# Patient Record
Sex: Male | Born: 1955 | Race: White | Hispanic: No | Smoking: Never smoker
Health system: Southern US, Community
[De-identification: ages and names within clinical notes are randomized; demographics above are authoritative.]

## PROBLEM LIST (undated history)

## (undated) DIAGNOSIS — Z21 Asymptomatic human immunodeficiency virus [HIV] infection status: Secondary | ICD-10-CM

## (undated) DIAGNOSIS — I209 Angina pectoris, unspecified: Secondary | ICD-10-CM

## (undated) DIAGNOSIS — N200 Calculus of kidney: Secondary | ICD-10-CM

## (undated) DIAGNOSIS — I2510 Atherosclerotic heart disease of native coronary artery without angina pectoris: Secondary | ICD-10-CM

## (undated) DIAGNOSIS — I219 Acute myocardial infarction, unspecified: Secondary | ICD-10-CM

## (undated) DIAGNOSIS — B20 Human immunodeficiency virus [HIV] disease: Secondary | ICD-10-CM

## (undated) DIAGNOSIS — E119 Type 2 diabetes mellitus without complications: Secondary | ICD-10-CM

## (undated) DIAGNOSIS — C469 Kaposi's sarcoma, unspecified: Secondary | ICD-10-CM

## (undated) DIAGNOSIS — Z8719 Personal history of other diseases of the digestive system: Secondary | ICD-10-CM

## (undated) DIAGNOSIS — I639 Cerebral infarction, unspecified: Secondary | ICD-10-CM

## (undated) DIAGNOSIS — I5022 Chronic systolic (congestive) heart failure: Secondary | ICD-10-CM

## (undated) DIAGNOSIS — I10 Essential (primary) hypertension: Secondary | ICD-10-CM

## (undated) DIAGNOSIS — R55 Syncope and collapse: Secondary | ICD-10-CM

## (undated) DIAGNOSIS — E039 Hypothyroidism, unspecified: Secondary | ICD-10-CM

## (undated) DIAGNOSIS — F419 Anxiety disorder, unspecified: Secondary | ICD-10-CM

## (undated) DIAGNOSIS — I214 Non-ST elevation (NSTEMI) myocardial infarction: Secondary | ICD-10-CM

## (undated) DIAGNOSIS — K219 Gastro-esophageal reflux disease without esophagitis: Secondary | ICD-10-CM

## (undated) DIAGNOSIS — J189 Pneumonia, unspecified organism: Secondary | ICD-10-CM

## (undated) DIAGNOSIS — J690 Pneumonitis due to inhalation of food and vomit: Secondary | ICD-10-CM

## (undated) DIAGNOSIS — E1140 Type 2 diabetes mellitus with diabetic neuropathy, unspecified: Secondary | ICD-10-CM

## (undated) DIAGNOSIS — E785 Hyperlipidemia, unspecified: Secondary | ICD-10-CM

## (undated) HISTORY — DX: Cerebral infarction, unspecified: I63.9

## (undated) HISTORY — DX: Essential (primary) hypertension: I10

## (undated) HISTORY — PX: CORONARY ARTERY BYPASS GRAFT: SHX141

## (undated) HISTORY — DX: Atherosclerotic heart disease of native coronary artery without angina pectoris: I25.10

## (undated) HISTORY — DX: Asymptomatic human immunodeficiency virus (hiv) infection status: Z21

## (undated) HISTORY — DX: Human immunodeficiency virus (HIV) disease: B20

## (undated) HISTORY — DX: Syncope and collapse: R55

## (undated) HISTORY — DX: Hyperlipidemia, unspecified: E78.5

## (undated) HISTORY — DX: Type 2 diabetes mellitus with diabetic neuropathy, unspecified: E11.40

## (undated) HISTORY — PX: CYSTOSCOPY W/ STONE MANIPULATION: SHX1427

## (undated) HISTORY — PX: LOOP RECORDER IMPLANT: SHX5954

## (undated) HISTORY — PX: CORONARY ANGIOPLASTY WITH STENT PLACEMENT: SHX49

---

## 1996-05-27 DIAGNOSIS — C469 Kaposi's sarcoma, unspecified: Secondary | ICD-10-CM

## 1996-05-27 HISTORY — DX: Kaposi's sarcoma, unspecified: C46.9

## 2001-06-09 ENCOUNTER — Inpatient Hospital Stay (HOSPITAL_COMMUNITY): Admission: EM | Admit: 2001-06-09 | Discharge: 2001-06-27 | Payer: Self-pay | Admitting: Emergency Medicine

## 2001-06-09 ENCOUNTER — Encounter: Payer: Self-pay | Admitting: Emergency Medicine

## 2001-06-23 ENCOUNTER — Encounter: Payer: Self-pay | Admitting: Cardiothoracic Surgery

## 2001-06-23 ENCOUNTER — Encounter: Payer: Self-pay | Admitting: Thoracic Surgery (Cardiothoracic Vascular Surgery)

## 2001-06-24 ENCOUNTER — Encounter: Payer: Self-pay | Admitting: Thoracic Surgery (Cardiothoracic Vascular Surgery)

## 2001-06-25 ENCOUNTER — Encounter: Payer: Self-pay | Admitting: Thoracic Surgery (Cardiothoracic Vascular Surgery)

## 2003-09-07 ENCOUNTER — Ambulatory Visit (HOSPITAL_COMMUNITY): Admission: RE | Admit: 2003-09-07 | Discharge: 2003-09-08 | Payer: Self-pay | Admitting: Cardiology

## 2003-09-25 ENCOUNTER — Emergency Department (HOSPITAL_COMMUNITY): Admission: EM | Admit: 2003-09-25 | Discharge: 2003-09-26 | Payer: Self-pay | Admitting: Emergency Medicine

## 2004-04-16 ENCOUNTER — Ambulatory Visit: Payer: Self-pay | Admitting: Cardiology

## 2004-08-03 ENCOUNTER — Ambulatory Visit: Payer: Self-pay | Admitting: Cardiology

## 2004-09-03 ENCOUNTER — Ambulatory Visit: Payer: Self-pay | Admitting: Cardiology

## 2004-09-11 ENCOUNTER — Ambulatory Visit (HOSPITAL_COMMUNITY): Admission: RE | Admit: 2004-09-11 | Discharge: 2004-09-11 | Payer: Self-pay | Admitting: Cardiology

## 2004-09-11 ENCOUNTER — Ambulatory Visit: Payer: Self-pay | Admitting: Cardiology

## 2004-10-20 ENCOUNTER — Emergency Department (HOSPITAL_COMMUNITY): Admission: EM | Admit: 2004-10-20 | Discharge: 2004-10-20 | Payer: Self-pay | Admitting: Emergency Medicine

## 2005-07-22 ENCOUNTER — Ambulatory Visit: Payer: Self-pay | Admitting: Cardiology

## 2005-09-17 ENCOUNTER — Ambulatory Visit: Payer: Self-pay | Admitting: Cardiology

## 2005-09-20 ENCOUNTER — Ambulatory Visit: Payer: Self-pay | Admitting: Cardiology

## 2005-09-20 ENCOUNTER — Ambulatory Visit (HOSPITAL_COMMUNITY): Admission: RE | Admit: 2005-09-20 | Discharge: 2005-09-21 | Payer: Self-pay | Admitting: Cardiology

## 2005-09-26 ENCOUNTER — Ambulatory Visit: Payer: Self-pay | Admitting: Cardiology

## 2005-10-01 ENCOUNTER — Ambulatory Visit: Payer: Self-pay | Admitting: Cardiology

## 2006-02-12 ENCOUNTER — Ambulatory Visit: Payer: Self-pay | Admitting: Cardiology

## 2006-09-22 ENCOUNTER — Ambulatory Visit: Payer: Self-pay | Admitting: Cardiology

## 2007-01-12 ENCOUNTER — Ambulatory Visit: Payer: Self-pay | Admitting: Cardiology

## 2007-01-25 ENCOUNTER — Emergency Department (HOSPITAL_COMMUNITY): Admission: EM | Admit: 2007-01-25 | Discharge: 2007-01-25 | Payer: Self-pay | Admitting: Emergency Medicine

## 2007-05-13 ENCOUNTER — Ambulatory Visit: Payer: Self-pay | Admitting: Cardiology

## 2007-09-16 ENCOUNTER — Ambulatory Visit: Payer: Self-pay | Admitting: Cardiology

## 2008-02-29 ENCOUNTER — Ambulatory Visit: Payer: Self-pay | Admitting: Cardiology

## 2008-02-29 LAB — CONVERTED CEMR LAB
CO2: 29 meq/L (ref 19–32)
Calcium: 9.5 mg/dL (ref 8.4–10.5)
Chloride: 99 meq/L (ref 96–112)
Creatinine, Ser: 1.4 mg/dL (ref 0.4–1.5)
Free T4: 1.3 ng/dL (ref 0.6–1.6)
Glucose, Bld: 175 mg/dL — ABNORMAL HIGH (ref 70–99)
INR: 0.9 (ref 0.8–1.0)
Lymphocytes Relative: 37.7 % (ref 12.0–46.0)
MCHC: 34.2 g/dL (ref 30.0–36.0)
Monocytes Relative: 9.9 % (ref 3.0–12.0)
Platelets: 329 10*3/uL (ref 150–400)
Potassium: 4.2 meq/L (ref 3.5–5.1)
TSH: 3.28 microintl units/mL (ref 0.35–5.50)
WBC: 5.4 10*3/uL (ref 4.5–10.5)

## 2008-03-07 ENCOUNTER — Ambulatory Visit: Payer: Self-pay | Admitting: Cardiology

## 2008-03-07 ENCOUNTER — Ambulatory Visit (HOSPITAL_COMMUNITY): Admission: RE | Admit: 2008-03-07 | Discharge: 2008-03-07 | Payer: Self-pay | Admitting: Cardiology

## 2008-10-12 DIAGNOSIS — E1165 Type 2 diabetes mellitus with hyperglycemia: Secondary | ICD-10-CM

## 2008-10-12 DIAGNOSIS — E7800 Pure hypercholesterolemia, unspecified: Secondary | ICD-10-CM | POA: Insufficient documentation

## 2008-10-12 DIAGNOSIS — K219 Gastro-esophageal reflux disease without esophagitis: Secondary | ICD-10-CM

## 2008-10-12 DIAGNOSIS — C469 Kaposi's sarcoma, unspecified: Secondary | ICD-10-CM | POA: Insufficient documentation

## 2008-10-12 DIAGNOSIS — N182 Chronic kidney disease, stage 2 (mild): Secondary | ICD-10-CM | POA: Insufficient documentation

## 2008-10-12 DIAGNOSIS — B20 Human immunodeficiency virus [HIV] disease: Secondary | ICD-10-CM

## 2008-10-12 DIAGNOSIS — E039 Hypothyroidism, unspecified: Secondary | ICD-10-CM | POA: Insufficient documentation

## 2008-10-12 DIAGNOSIS — Z794 Long term (current) use of insulin: Secondary | ICD-10-CM

## 2008-10-12 DIAGNOSIS — Z951 Presence of aortocoronary bypass graft: Secondary | ICD-10-CM

## 2009-02-28 ENCOUNTER — Ambulatory Visit: Payer: Self-pay | Admitting: Cardiology

## 2009-02-28 DIAGNOSIS — N529 Male erectile dysfunction, unspecified: Secondary | ICD-10-CM | POA: Insufficient documentation

## 2009-12-29 ENCOUNTER — Ambulatory Visit: Payer: Self-pay | Admitting: Cardiology

## 2010-02-06 ENCOUNTER — Ambulatory Visit: Payer: Self-pay | Admitting: Cardiology

## 2010-02-14 ENCOUNTER — Ambulatory Visit: Payer: Self-pay | Admitting: Cardiology

## 2010-02-14 ENCOUNTER — Ambulatory Visit (HOSPITAL_COMMUNITY): Admission: RE | Admit: 2010-02-14 | Discharge: 2010-02-14 | Payer: Self-pay | Admitting: Cardiology

## 2010-02-19 ENCOUNTER — Telehealth: Payer: Self-pay | Admitting: Cardiology

## 2010-02-22 ENCOUNTER — Ambulatory Visit: Payer: Self-pay | Admitting: Cardiology

## 2010-03-05 ENCOUNTER — Ambulatory Visit: Payer: Self-pay | Admitting: Cardiology

## 2010-03-14 LAB — CONVERTED CEMR LAB
AST: 18 units/L (ref 0–37)
Alkaline Phosphatase: 88 units/L (ref 39–117)
Direct LDL: 176 mg/dL
Total CHOL/HDL Ratio: 6
VLDL: 18.6 mg/dL (ref 0.0–40.0)

## 2010-03-15 ENCOUNTER — Telehealth: Payer: Self-pay | Admitting: Cardiology

## 2010-06-17 ENCOUNTER — Encounter: Payer: Self-pay | Admitting: Internal Medicine

## 2010-06-24 LAB — CONVERTED CEMR LAB
BUN: 21 mg/dL (ref 6–23)
Calcium: 9.2 mg/dL (ref 8.4–10.5)
GFR calc non Af Amer: 66.66 mL/min (ref >60)
Potassium: 4.5 meq/L (ref 3.5–5.1)

## 2010-06-26 NOTE — Assessment & Plan Note (Signed)
Summary: Jackson Shaw   Visit Type:  Follow-up Primary [name]:  Dr. Alvina Chou  CC:  tightness, chest pain, and sob.  History of Present Illness: More chest pain with activity.  Still gets tight.  He eats in a healthy fashion.  The symptoms continue to get worse.  Has some discomfort at rest.  He notices it more when he is driving the car.   Current Medications (verified): 1)  Toprol Xl 50 Mg Xr24h-Tab (Metoprolol Succinate) .... Take 1 Tablet By Mouth Once A Day 2)  Epavir 300/450mg  .... Once Daily 3)  Viread Combo Drug .... Once Daily 4)  Reyataz 500mg  .... Once Daily 5)  Imdur 30 Mg Xr24h-Tab (Isosorbide Mononitrate) .... Take 1 1/2  Tablet By Mouth Once A Day 6)  Pravastatin Sodium 20 Mg Tabs (Pravastatin Sodium) .Marland Kitchen.. 1 Tab At Bedtime 7)  Plavix 75 Mg Tabs (Clopidogrel Bisulfate) .... Take 1 Tablet By Mouth Once A Day 8)  Humalog 100 Unit/ml Soln (Insulin Lispro (Human)) .... As Directed 9)  Protonix 40 Mg Tbec (Pantoprazole Sodium) .... Take 1 Tablet By Mouth Once A Day 10)  Aspirin 81 Mg Tbec (Aspirin) .... Take One Tablet By Mouth Daily 11)  Synthroid 200 Mcg Tabs (Levothyroxine Sodium) .... Take 1 Tablet By Mouth Once A Day 12)  Nitrostat 0.4 Mg Subl (Nitroglycerin) .... Take As Directed For Chest Pain  Allergies (verified): 1)  ! Penicillin 2)  ! 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 Head:  normocephalic and atraumatic Eyes:  PERRLA/EOM intact; conjunctiva and lids normal. Chest Wall:  MS healed. Lungs:  Clear bilaterally to auscultation and percussion. Heart:  PMI non displaced.  Normal S1 and S2.  No murmur or rub. Pulses:  pulses normal in all 4 extremities Extremities:  No clubbing or cyanosis. Neurologic:  Alert and oriented x 3.   Cardiac Cath  Procedure date:  03/07/2008  Findings:       CONCLUSION:   1. Continued patency of the internal mammary to the LAD with some       progression of disease just distal to its insertion and known       apical disease.   2. Continued patency of the sequential vein graft to the diagonal OM       with some retrograde filling of the posterior circumflex.   3. Continued patency of the saphenous vein graft to the PDA, but with       diffuse advanced distal disease that is not amenable to       percutaneous intervention.      DISPOSITION:  The patient does have some renal insufficiency.  We will   have him hydrated at home.  I will see him back in the office.  We will   increase  his Imdur to 60 mg daily.  Hopefully, his symptoms will   improve; if not, he would be a candidate for a small drug-eluting stent   into the LAD if he can take Plavix on an extended basis.   EKG  Procedure date:  12/29/2009  Findings:      NSR.  LAFB.  Inferior MI, old.  No acute changes.  Impression & Recommendations:  Problem # 1:  CAD, ARTERY BYPASS GRAFT (ICD-414.04) Continues to have symptoms.  Anatomy from cath reviewed in detail, and discussed.  Will increase beta blockade as HR in the 80's, and assess response.  Would prefer to defer cath, and results of last study discussed.  If symptoms continue to progress, may need restudy.   His updated medication list for this problem includes:    Toprol Xl 50 Mg Xr24h-tab  (Metoprolol succinate) .Marland Kitchen... Take 11/2 tabs twice daily    Imdur 30 Mg Xr24h-tab (Isosorbide mononitrate) .Marland Kitchen... Take 1 1/2  tablet by mouth once a day    Plavix 75 Mg Tabs (Clopidogrel bisulfate) .Marland Kitchen... Take 1 tablet by mouth once a day    Aspirin 81 Mg Tbec (Aspirin) .Marland Kitchen... Take one tablet by mouth daily    Nitrostat 0.4 Mg Subl (Nitroglycerin) .Marland Kitchen... Take as directed for chest pain  Problem # 2:  RENAL DISEASE, CHRONIC, STAGE II (ICD-585.2) Followed at [facility].  Has needed hydration with each cath study.  Problem # 3:  HYPERCHOLESTEROLEMIA (ICD-272.0) was restarted at [facility] on medical therapy.  Some limitation by other drugs.  His updated medication list for this problem includes:    Pravastatin Sodium 20 Mg Tabs (Pravastatin sodium) .Marland Kitchen... 1 tab at bedtime  Problem # 4:  HYPOTHYROIDISM (ICD-244.9) followed in endo clinic at [facility].  Was an issue in the past.   His updated medication list for this problem includes:    Synthroid 200 Mcg Tabs (Levothyroxine sodium) .Marland Kitchen... Take 1 tablet by mouth once a day  Patient Instructions: 1)  Your physician recommends that you schedule a follow-up appointment in: 6 WEEKS 2)  Your physician has recommended you make the following change in your medication: PLEASE INCREASE METOPROLOL TO 75MG  bid Prescriptions: TOPROL XL 50 MG XR24H-TAB (METOPROLOL SUCCINATE) take 11/2 tabs twice daily  #90 x 11   Entered by:   Ledon Snare, RN   Authorized by:   Ronaldo Miyamoto, MD, [facility]   Signed by:   Ledon Snare, RN on 12/29/2009   Method used:   [facility] to        [facility]  [phone]* (retail)       [address] [place], [place]  [address]       Ph: [phone] or [phone]       Fax: [phone]   RxID:   [id number]

## 2010-06-26 NOTE — Assessment & Plan Note (Signed)
Summary: ROV   Visit Type:  Follow-up Primary Provider:  Dr. Alvina Chou  CC:  Chest pains.  History of Present Illness: Went to the [employer], and started having chest pain walking back up the seats.  He sat in the sun, but did not get dehydrated.  Frequency of pain has continued, and increased, and he feels he needs another cath procedure.  We have a close, ongoing dialogue about this over an extended period of time.    Current Medications (verified): 1)  Toprol Xl 50 Mg Xr24h-Tab (Metoprolol Succinate) .... Take 11/2 Tabs Twice Daily 2)  Epavir 300/450mg  .... Once Daily 3)  Viread Combo Drug .... Once Daily 4)  Reyataz 500mg  .... Once Daily 5)  Imdur 30 Mg Xr24h-Tab (Isosorbide Mononitrate) .... Take 1 1/2  Tablet By Mouth Once A Day 6)  Pravastatin Sodium 20 Mg Tabs (Pravastatin Sodium) .Marland Kitchen.. 1 Tab At Bedtime 7)  Plavix 75 Mg Tabs (Clopidogrel Bisulfate) .... Take 1 Tablet By Mouth Once A Day 8)  Humalog 100 Unit/ml Soln (Insulin Lispro (Human)) .... As Directed 9)  Protonix 40 Mg Tbec (Pantoprazole Sodium) .... Take 1 Tablet By Mouth Once A Day 10)  Aspirin 81 Mg Tbec (Aspirin) .... Take One Tablet By Mouth Daily 11)  Synthroid 200 Mcg Tabs (Levothyroxine Sodium) .... Take 1 Tablet By Mouth Once A Day 12)  Nitrostat 0.4 Mg Subl (Nitroglycerin) .... Take As Directed For Chest Pain  Allergies: 1)  ! Penicillin 2)  ! 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  Family History: Last updated: 10/12/2008  The  patient has an extensive family history of coronary  heart disease.  His mother is living.  She has had a prior myocardial  infarction and bypass surgery.  His father died at age 7 of myocardial  infarction.  He has a sister who is 15 and has diabetes.  Social History: Last updated: 10/12/2008  The patient denies alcohol or tobacco use.  He does not use  any illicit drugs.  Vital Signs:  Patient profile:   55 year old male Height:      69 inches Weight:      160.50 pounds BMI:     23.79 Pulse rate:   74 / minute Pulse rhythm:   regular Resp:     18 per minute BP sitting:   126 / 84  (left arm) Cuff size:   large  Vitals Entered By: Vikki Ports (February 06, 2010 9:27 AM)  Physical Exam  General:  Well developed, well nourished, in no acute distress. Head:  normocephalic and atraumatic Eyes:  PERRLA/EOM intact; conjunctiva and lids normal. Lungs:  Clear bilaterally to auscultation and percussion. Heart:  PMI non displaced.  Normal S1 and S2.   Abdomen:  Bowel sounds positive; abdomen soft and non-tender without masses, organomegaly, or hernias noted. No hepatosplenomegaly. Extremities:  No clubbing or cyanosis. Neurologic:  Alert and oriented x 3.   Cardiac Cath  Procedure date:  03/07/2008  Findings:       ANGIOGRAPHIC DATA:   1. The left main is complex at its distal-most aspect with about 70%       eccentric  narrowing.   2. The LAD itself is occluded.   3. The left internal mammary to the distal LAD is widely patent.       However, just after its insertion into the LAD, there is a mildly       segmental plaque of about 78% which is progressed from the previous       study.  The vessel then opens up and courses to the apex where       there is a 90% stenosis just prior to the apical tip.  It       bifurcates at the apical tip.  This provides coverage of the distal       portion of the inferior wall.   4. The circumflex proper has 2 marginal branches, which are  out.       There is an AV branch with about 90% narrowing.  The sequential       vein graft to the diagonal and marginal is widely patent.  It does       fill retrograde into the AV circumflex.   5. The right coronary artery is severely diseased with multiple       lesions, then totally occluded in its midportion.   6. The saphenous vein graft to the PDA is intact.  However, the PDA in       an antegrade fashion is a typical diabetic-appearing artery,       diffusely diseased.  The retrograde portion has disease also before       it fills the AV groove.  There is a first branch of the AV groove       and a 70% segmental plaque.  There is another tiny branch, which       has 90% narrowing and there is 60% narrowing just prior to the       origin of fairly large posterolateral branch.  The posterolateral       branch has segmental plaque of about 90%, and the distal vessel was       fairly small in caliber, certainly not a good vessel for grafting.       There is also 70% narrowing in the continuation branch.      CONCLUSION:   1. Continued patency of the internal mammary to the LAD with some       progression of disease just distal to its insertion and known       apical disease.   2. Continued patency of the sequential vein graft to the diagonal OM       with some retrograde fi  Cardiac Cath  Procedure date:  02/06/2010  Findings:      NSR.  WNL.  Impression & Recommendations:  Problem # 1:  CAD, ARTERY BYPASS GRAFT (ICD-414.04) Continues to have worsening chest pain.  Will bring in for hydration, and repeat cath.  Options are somewhat limited, and he understands, but wants to be clear about options over time.  I agree with him, and will recommend cath study.  His updated medication list for this problem includes:    Toprol Xl 50 Mg Xr24h-tab (Metoprolol succinate) .Marland Kitchen... Take 11/2 tabs twice daily    Imdur 30 Mg Xr24h-tab (Isosorbide mononitrate) .Marland Kitchen... Take 1 1/2  tablet by mouth  once a day    Plavix 75 Mg Tabs (Clopidogrel bisulfate) .Marland Kitchen... Take 1 tablet by mouth once a day    Aspirin 81 Mg Tbec (Aspirin) .Marland Kitchen... Take one tablet  by mouth daily    Nitrostat 0.4 Mg Subl (Nitroglycerin) .Marland Kitchen... Take as directed for chest pain  Problem # 2:  HYPERCHOLESTEROLEMIA (ICD-272.0)  undertreatment. His updated medication list for this problem includes:    Pravastatin Sodium 20 Mg Tabs (Pravastatin sodium) .Marland Kitchen... 1 tab at bedtime  His updated medication list for this problem includes:    Pravastatin Sodium 20 Mg Tabs (Pravastatin sodium) .Marland Kitchen... 1 tab at bedtime  Problem # 3:  HUMAN IMMUNODEFICIENCY VIRUS [HIV] (ICD-042) stable, followed in [place].  Problem # 4:  RENAL DISEASE, CHRONIC, STAGE II (ICD-585.2) secondary to diabetes.  Check cr. and decide on fluids.    Orders: EKG w/ Interpretation (93000) Cardiac Catheterization (Cardiac Cath) TLB-BMP (Basic Metabolic Panel-BMET) (80048-METABOL)  Patient Instructions: 1)  Your physician has requested that you have a cardiac catheterization.  Cardiac catheterization is used to diagnose and/or treat various heart conditions. Doctors may recommend this procedure for a number of different reasons. The most common reason is to evaluate chest pain. Chest pain can be a symptom of coronary artery disease (CAD), and cardiac catheterization can show whether plaque is narrowing or blocking your heart's arteries. This procedure is also used to evaluate the valves, as well as measure the blood flow and oxygen levels in different parts of your heart.  For further information please visit [email or web address]  Please follow instruction sheet, as given. 2)  Your physician recommends that you have lab work today: BMP 3)  Your physician recommends that you continue on your current medications as directed. Please refer to the Current Medication list given to you today. 4)  Your physician recommends that you schedule a follow-up appointment in: 3  WEEKS Prescriptions: NITROSTAT 0.4 MG SUBL (NITROGLYCERIN) Take as directed for chest pain  #25 x 2   Entered by:   Julieta Gutting, RN, BSN   Authorized by:   Ronaldo Miyamoto, MD, [facility]   Signed by:   Julieta Gutting, RN, BSN on 02/06/2010   Method used:   [facility] to        [facility]  [phone]* (retail)       [address] [place], [place]  [address]       Ph: [phone] or [phone]       Fax: [phone]   RxID:   [id number]

## 2010-06-26 NOTE — Letter (Signed)
Summary: Cardiac Catheterization Instructions- Main Lab  [facility], Main Office  [address]   [place], [place] [address]   Phone: [phone]  Fax: [phone]     02/06/2010 MRN: [id number]  [facility] [address] [place], [place]  [address]  Dear Mr. Newgent,   You are scheduled for Cardiac Catheterization on Wednesday February 14, 2010 with Dr. Riley Kill.  Please arrive at the [facility] at 7:30       a.m. on the day of your procedure.  1. DIET     _X___ Nothing to eat or drink after midnight except your medications with a sip of water.  2. MAKE SURE YOU TAKE YOUR ASPIRIN AND PLAVIX.  3. __X___ DO NOT TAKE these medications before your procedure:         Do not take Humalog the morning of procedure.  Take half of evening dose of Humalog on Tuesday night.       _X___ YOU MAY TAKE ALL of your remaining medications with a small amount of water.       4. Plan for one night stay - bring personal belongings (i.e. toothpaste, toothbrush, etc.)  5. Bring a current list of your medications and current insurance cards.  6. Must have a responsible person to drive you home.   7. Someone must be with you for the first 24 hours after you arrive home.  8. Please wear clothes that are easy to get on and off and wear slip-on shoes.  *Special note: Every effort is made to have your procedure done on time.  Occasionally there are emergencies that present themselves at the hospital that may cause delays.  Please be patient if a delay does occur.  If you have any questions after you get home, please call the office at the number listed above.  Julieta Gutting, RN, BSN

## 2010-06-26 NOTE — Progress Notes (Signed)
Summary: Increase Pravastatin to 40mg   Phone Note Outgoing Call   Call placed by: Julieta Gutting, RN, BSN,  March 15, 2010 3:15 PM Call placed to: Patient Summary of Call: I spoke with the pt and made him aware of lab results.  The pt will increase Pravastatin to 40mg  daily.  The pt has a scheduled appt on 04/25/10 and he will come into the office fasting. Pt agreed with plan.  New Rx sent to pharmacy.  Initial call taken by: Julieta Gutting, RN, BSN,  March 15, 2010 3:15 PM    New/Updated Medications: PRAVASTATIN SODIUM 40 MG TABS (PRAVASTATIN SODIUM) Take one tablet by mouth daily at bedtime Prescriptions: PRAVASTATIN SODIUM 40 MG TABS (PRAVASTATIN SODIUM) Take one tablet by mouth daily at bedtime  #30 x 6   Entered by:   Julieta Gutting, RN, BSN   Authorized by:   Ronaldo Miyamoto, MD, [facility]   Signed by:   Julieta Gutting, RN, BSN on 03/15/2010   Method used:   [facility] to        [facility]  [phone]* (retail)       [address] [place], [place]  [address]       Ph: [phone] or [phone]       Fax: [phone]   RxID:   [id number]

## 2010-06-26 NOTE — Cardiovascular Report (Signed)
Summary: Cath/Percutaneous Orders   Cath/Percutaneous Orders   Imported By: Roderic Ovens 02/23/2010 09:18:17  _____________________________________________________________________  External Attachment:    Type:   Image     Comment:   External Document

## 2010-06-26 NOTE — Assessment & Plan Note (Signed)
Summary: Post hospital   Visit Type:  Post-hospital Primary Provider:  Dr. Alvina Chou  CC:  Some chest pains.  History of Present Illness: Recently seen in the hospital again for repeat cath.  We went back over all of his anatomy today in detail, and I reviewed with him his angio on the laptop computer.  We discussed mechanisms of continued angina.  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  We talked medical strategies, with subsequent potential use of Ranexa.  I have some concerns in part over the complexity of his antiviral regimen for HIV/AIDS, and additional treatment, but we are thoughtfully considering it, and I may bring it up with his ID physician in [facility] if he is not improved in the short term.    Current Medications (verified): 1)  Toprol Xl 50 Mg Xr24h-Tab (Metoprolol Succinate) .... Take 11/2 Tabs Twice Daily 2)  Epavir 300/450mg  .... Once Daily 3)  Viread Combo Drug .... Once Daily 4)  Reyataz 500mg  .... Once Daily 5)  Imdur 30 Mg Xr24h-Tab (Isosorbide Mononitrate) .... Take 1 1/2  Tablet By Mouth Once A Day 6)  Pravastatin Sodium 20 Mg Tabs (Pravastatin Sodium) .Marland Kitchen.. 1 Tab At Bedtime 7)  Plavix 75 Mg Tabs (Clopidogrel Bisulfate) .... Take 1 Tablet By Mouth Once A Day 8)  Humalog 100 Unit/ml Soln (Insulin Lispro (Human)) .... As Directed 9)  Protonix 40 Mg Tbec (Pantoprazole Sodium) .... Take 1 Tablet By Mouth Once A Day 10)  Aspirin 81 Mg Tbec (Aspirin) .... Take One Tablet By Mouth Daily 11)  Synthroid 200 Mcg Tabs (Levothyroxine Sodium) .... Take 1 Tablet By Mouth Once A Day 12)  Nitrostat 0.4 Mg Subl (Nitroglycerin) .... Take As Directed For Chest Pain  Allergies: 1)  ! Penicillin 2)  ! Codeine  Vital Signs:  Patient profile:   55 year old male Height:      69 inches Weight:      160.75 pounds BMI:     23.82 Pulse rate:   68 / minute Pulse rhythm:   regular Resp:     18 per minute BP sitting:   122 / 80  (left arm) Cuff size:   large  Vitals  Entered By: Vikki Ports (February 22, 2010 2:03 PM)  Physical Exam  General:  Well developed, well nourished, in no acute distress. Head:  normocephalic and atraumatic Eyes:  PERRLA/EOM intact; conjunctiva and lids normal. Lungs:  Clear bilaterally to auscultation and percussion. Heart:  PMI non displaced.  Normal S1 and S2.  No murmur or rub, or gallop.   Pulses:  pulses normal in all 4 extremities Extremities:  No clubbing or cyanosis. Neurologic:  Alert and oriented x 3.   EKG  Procedure date:  02/22/2010  Findings:      NSR.  WNL.  CXR  Procedure date:  02/14/2010  Findings:      CHEST - 2 VIEW   Comparison:  03/07/2008   Findings:  The heart size and mediastinal contours are within normal limits.  Both lungs are clear.  The visualized skeletal structures are unremarkable. The previous coronary bypass grafting noted.   IMPRESSION: No active cardiopulmonary disease.   Read By:  Danae Orleans,  M.D.     Released By:  Danae Orleans,  M.D.  Cardiac Cath  Procedure date:  02/14/2010  Findings:      CONCLUSIONS: 1. Continued patency of the internal mammary to the LAD with severe     apical  disease. 2. Continued patency of the sequential graft to the diagonal and ramus     intermedius with some flow into the distal diagonal. 3. Continued patency of the saphenous vein graft to the PDA with     distal PDA and retrograde PLA disease. 4. Progression of the left main lesion, but this area appears to be     supplying only a small diagonal and there is protection of this     area from the intermediate graft.   DISPOSITION:  We will continue to monitor.  Recommend medical therapy and I will see him back in followup.    Impression & Recommendations:  Problem # 1:  CAD, ARTERY BYPASS GRAFT (ICD-414.04) See my note. His graft situation is preetty good, largely unchanged.  He has severe native disease underlying the findings,  with his distal RCA, retrograde RCA,  apical LAD and other findings as noted.  We have discussed medical strategies as in the HPI, and will continue along this route.  I reassured him.  Redo would not help, and the situation is very unfavorable for PCI.  We thoroughly reviewed.   His updated medication list for this problem includes:    Toprol Xl 50 Mg Xr24h-tab (Metoprolol succinate) .Marland Kitchen... Take 11/2 tabs twice daily    Imdur 30 Mg Xr24h-tab (Isosorbide mononitrate) .Marland Kitchen... Take 1 1/2  tablet by mouth once a day    Plavix 75 Mg Tabs (Clopidogrel bisulfate) .Marland Kitchen... Take 1 tablet by mouth once a day    Aspirin 81 Mg Tbec (Aspirin) .Marland Kitchen... Take one tablet by mouth daily    Nitrostat 0.4 Mg Subl (Nitroglycerin) .Marland Kitchen... Take as directed for chest pain    Isosorbide Mononitrate Cr 60 Mg Xr24h-tab (Isosorbide mononitrate) .Marland Kitchen... Take one tablet daily  Orders: EKG w/ Interpretation (93000)  Problem # 2:  HYPERCHOLESTEROLEMIA (ICD-272.0) Remains on treatment.  His dose could be increased.  We will recheck his lipids,and adjust.   His updated medication list for this problem includes:    Pravastatin Sodium 20 Mg Tabs (Pravastatin sodium) .Marland Kitchen... 1 tab at bedtime  Problem # 3:  DIABETES MELLITUS, TYPE II, ON INSULIN (ICD-250.00) medically managed His updated medication list for this problem includes:    Humalog 100 Unit/ml Soln (Insulin lispro (human)) .Marland Kitchen... As directed    Aspirin 81 Mg Tbec (Aspirin) .Marland Kitchen... Take one tablet by mouth daily  Problem # 4:  HYPOTHYROIDISM (ICD-244.9) thyroid replacement.  He assures me his TSH is appropriate.  This has been issue in the past. His updated medication list for this problem includes:    Synthroid 200 Mcg Tabs (Levothyroxine sodium) .Marland Kitchen... Take 1 tablet by mouth once a day  Problem # 5:  HUMAN IMMUNODEFICIENCY VIRUS [HIV] (ICD-042) Followed in [facility].  Patient Instructions: 1)  Your physician recommends that you schedule a follow-up appointment in: 2 MONTHS 2)  Your physician has recommended you  make the following change in your medication: START Isosorbide MN 60mg  one tablet daily Prescriptions: ISOSORBIDE MONONITRATE CR 60 MG XR24H-TAB (ISOSORBIDE MONONITRATE) take one tablet daily  #30 x 6   Entered by:   Julieta Gutting, RN, BSN   Authorized by:   Ronaldo Miyamoto, MD, [facility]   Signed by:   Julieta Gutting, RN, BSN on 02/22/2010   Method used:   [facility] to        [facility]  [phone]* (retail)       [address] [place], [place]  [address]       Ph: [phone] or [phone]       Fax: [phone]   RxID:   [id number]   Appended Document: Post hospital Lauren  Can you have him come in for lipid and liver, and TSH level.  Thanks so much.  TS  I spoke with the pt and he will come into the office on 03/05/10 for fasting labwork.

## 2010-06-26 NOTE — Progress Notes (Signed)
Summary: TALK TO NURSE  Phone Note Call from Patient Call back at Home Phone [phone]   Caller: Patient Summary of Call: PT WANTS TO TALK TO NURSE ABOUT HIS CATH THAT HE HAD. DR Riley Kill TOLD HIM TO CALL AND TALK WITH LAUREN Initial call taken by: Edman Circle,  February 19, 2010 10:42 AM  Follow-up for Phone Call        Dr Riley Kill wanted to make sure the pt has an appt with him this week to f/u on cardiac cath.  The pt already has a scheduled appt on 02/22/10 at 1:45 with Dr Riley Kill. Message left for pt on identified voicemail. Follow-up by: Julieta Gutting, RN, BSN,  February 19, 2010 11:25 AM

## 2010-08-01 ENCOUNTER — Telehealth: Payer: Self-pay | Admitting: Cardiology

## 2010-08-07 NOTE — Progress Notes (Signed)
Summary: handicap form  Phone Note Call from Patient Call back at Home Phone [phone]   Caller: Patient Reason for Call: Talk to Nurse Summary of Call: Pt. wants to know when can he come to pick up and a form for handicap for his car.  Initial call taken by: Roe Coombs,  August 01, 2010 9:39 AM  Follow-up for Phone Call        [facility]* Left message advising patient that Dr. Riley Kill would be in the office tomorrow. If he doesn't mind giving him the sticker, then his RN can call him when it is ready. Whitney Maeola Sarah RN  August 01, 2010 1:10 PM  Follow-up by: Whitney Maeola Sarah RN,  August 01, 2010 1:10 PM  Additional Follow-up for Phone Call Additional follow up Details #1::        pt wants to know if dr sign the form. pt would like to talk to a nurse. Additional Follow-up by: Roe Coombs,  August 02, 2010 5:00 PM    Additional Follow-up for Phone Call Additional follow up Details #2::    I spoke with Dr Riley Kill and he will sign for handicap placard.  I left a message on the pt's voicemail with this information.  I instructed the pt to call the office to let me know if he wants to pick-up paper or have paper mailed.  Julieta Gutting, RN, BSN  August 02, 2010 5:19 PM  The pt called back and would like to pick-up paper at the front desk. Julieta Gutting, RN, BSN  August 03, 2010 9:38 AM

## 2010-08-09 LAB — CBC
HCT: 36 % — ABNORMAL LOW (ref 39.0–52.0)
Hemoglobin: 12.7 g/dL — ABNORMAL LOW (ref 13.0–17.0)
RBC: 4.1 MIL/uL — ABNORMAL LOW (ref 4.22–5.81)
RDW: 12.9 % (ref 11.5–15.5)

## 2010-08-09 LAB — GLUCOSE, CAPILLARY
Glucose-Capillary: 117 mg/dL — ABNORMAL HIGH (ref 70–99)
Glucose-Capillary: 153 mg/dL — ABNORMAL HIGH (ref 70–99)
Glucose-Capillary: 163 mg/dL — ABNORMAL HIGH (ref 70–99)

## 2010-08-09 LAB — BASIC METABOLIC PANEL
CO2: 27 mEq/L (ref 19–32)
Calcium: 9.4 mg/dL (ref 8.4–10.5)
Creatinine, Ser: 1.53 mg/dL — ABNORMAL HIGH (ref 0.4–1.5)
GFR calc non Af Amer: 48 mL/min — ABNORMAL LOW (ref >60)
Glucose, Bld: 152 mg/dL — ABNORMAL HIGH (ref 70–99)
Potassium: 4.5 mEq/L (ref 3.5–5.1)

## 2010-08-09 LAB — TSH: TSH: 0.308 u[IU]/mL — ABNORMAL LOW (ref 0.350–4.500)

## 2010-10-09 NOTE — Assessment & Plan Note (Signed)
[facility]                            CARDIOLOGY OFFICE NOTE   Jackson Shaw, Jackson Shaw                         MRN:          [id number]  DATE:01/12/2007                            DOB:          1955/11/05    [name] is in for follow-up.  He was seen in the endocrinology clinic  at [facility].  Apparently his TSH was in the 40 range on 175 mcg.  He had  previously been on 200 mcg of Synthroid daily.  I reviewed the chart and  carefully pointed out to them that his dose was decreased from 200 mcg  to 175 mcg at a time when the TSH measured at 0.11.  In addition, the  patient was having increasing angina.  He apparently has recently been  noted to have two nodules on his CT scan.  They did a CT of the abdomen  because of discomfort under the right rib cage.  They previously had  noted him to have gallstones, but these were not felt to be symptomatic  and a CT of the abdomen done here did not reveal any specific findings.  He now is noted to have two small nodules on his CT scan of the chest  and, as a result they have recommended a follow-up, which is going to be  done at [facility].  Cardiac-wise he says he has been moderately fatigued but I  did point out to him that the hypothyroidism could be accountable for  this.  Chest pain has not been a frequent symptom.   His medications currently involve:  1. Synthroid 200 mcg daily.  2. Toprol XL 50 mg daily.  3. Plavix 75 mg daily.  4. Protonix 40 mg daily.  5. Humalog t.i.d.  6. Pravachol 20 mg q.h.s.  7. Imdur 30 mg daily.  8. His antiretroviral medications.  9. Enteric-coated aspirin 325 mg daily.   Today on examination, the blood pressure is 118/68, the pulse is 63.  The lung fields are clear.  The cardiac rhythm is regular.   The electrocardiogram is entirely within normal limits.   IMPRESSION:  1. Hypercholesterolemia.  2. Insulin-dependent diabetes mellitus.  3. Hypothyroidism.  4. Last catheterization  demonstrating patency of his vein grafts and      patency of the internal mammary artery with distal disease as noted      in the previous diagnostic text.   PLAN:  1. At the present time I would continue current medical regimen.  2. I have strongly encouraged him to have follow-up TSH's and done on      a regular basis.  I have worried about compliance in the past but      he insists that he is taking his thyroid on a regular basis and I      believe what he says.  He therefore should have follow-up TSH's to      make sure that he gets in the constraints of the appropriate TSH      levels.  3. I think he can decrease his enteric-coated aspirin to 81 mg  daily.  4. If he has increasing angina then we may be required to do a repeat      cardiac catheterization but, fortunately, we know that his grafts      are patent.  Most of the problems are probably from distal disease      and his EKG is normal, so continued medical therapy would be      indicated at the present time.  5. I will see him back in follow-up in 3 months.     Jackson Shaw. Jackson Kill, MD, [facility]  Electronically Signed    TDS/MedQ  DD: 01/12/2007  DT: 01/13/2007  Job #: [id number]

## 2010-10-09 NOTE — Cardiovascular Report (Signed)
Jackson Shaw, GROENE                ACCOUNT NO.:  [id number]   MEDICAL RECORD NO.:  [id number]          PATIENT TYPE:  OIB   LOCATION:  2899                         FACILITY:  [facility]   PHYSICIAN:  Arturo Morton. Riley Kill, MD, FACCDATE OF BIRTH:  [birth year]   DATE OF PROCEDURE:  03/07/2008  DATE OF DISCHARGE:  03/07/2008                            CARDIAC CATHETERIZATION   INDICATIONS:  Mr. Seltzer is a 55 year old, well known to me.  He has  insulin-dependent diabetes mellitus.  He also has some renal  insufficiency.  He has had prior bypass surgery and the current study is  done to assess coronary anatomy.  He has had recent increase in angina  frequency.   PROCEDURES:  1. Left heart catheterization.  2. Selective coronary arteriography.  3. Saphenous vein graft angiography.  4. Selective internal mammary angiography.   DESCRIPTION OF PROCEDURE:  The patient was brought to the  Catheterization Laboratory and prepped and draped in the usual fashion.  Through an anterior puncture, the femoral artery was easily entered.  A  5-French sheath was then placed.  Views of the left and right coronaries  were obtained.  Vein graft and internal mammary angiography were then  performed without complication.  He tolerated the procedure well and  there were no major problems.  His creatinine at the beginning of the  procedure was 1.5.  He was given limited contrast.  No ventriculogram  was done.  Left ventricular pressures were measured.  There were no  major complications.  He was taken to the holding area in satisfactory  clinical condition.   HEMODYNAMIC DATA:  1. Central aortic pressure was 143/90.  2. Left ventricular pressure 147/10.  3. There was no gradient on pullback across aortic valve.   ANGIOGRAPHIC DATA:  1. The left main is complex at its distal-most aspect with about 70%      eccentric narrowing.  2. The LAD itself is occluded.  3. The left internal mammary to the distal LAD is  widely patent.      However, just after its insertion into the LAD, there is a mildly      segmental plaque of about 78% which is progressed from the previous      study.  The vessel then opens up and courses to the apex where      there is a 90% stenosis just prior to the apical tip.  It      bifurcates at the apical tip.  This provides coverage of the distal      portion of the inferior wall.  4. The circumflex proper has 2 marginal branches, which are out.      There is an AV branch with about 90% narrowing.  The sequential      vein graft to the diagonal and marginal is widely patent.  It does      fill retrograde into the AV circumflex.  5. The right coronary artery is severely diseased with multiple      lesions, then totally occluded in its midportion.  6. The saphenous vein graft to the PDA  is intact.  However, the PDA in      an antegrade fashion is a typical diabetic-appearing artery,      diffusely diseased.  The retrograde portion has disease also before      it fills the AV groove.  There is a first branch of the AV groove      and a 70% segmental plaque.  There is another tiny branch, which      has 90% narrowing and there is 60% narrowing just prior to the      origin of fairly large posterolateral branch.  The posterolateral      branch has segmental plaque of about 90%, and the distal vessel was      fairly small in caliber, certainly not a good vessel for grafting.      There is also 70% narrowing in the continuation branch.   CONCLUSION:  1. Continued patency of the internal mammary to the LAD with some      progression of disease just distal to its insertion and known      apical disease.  2. Continued patency of the sequential vein graft to the diagonal OM      with some retrograde filling of the posterior circumflex.  3. Continued patency of the saphenous vein graft to the PDA, but with      diffuse advanced distal disease that is not amenable to       percutaneous intervention.   DISPOSITION:  The patient does have some renal insufficiency.  We will  have him hydrated at home.  I will see him back in the office.  We will  increase his Imdur to 60 mg daily.  Hopefully, his symptoms will  improve; if not, he would be a candidate for a small drug-eluting stent  into the LAD if he can take Plavix on an extended basis.      Arturo Morton. Riley Kill, MD, [facility]  Electronically Signed     TDS/MEDQ  D:  03/07/2008  T:  03/08/2008  Job:  [id number]   cc:   CV Laboratory  HIV Clinic

## 2010-10-09 NOTE — Assessment & Plan Note (Signed)
[facility]                            CARDIOLOGY OFFICE NOTE   Jackson Shaw, Jackson Shaw                         MRN:          [id number]  DATE:09/16/2007                            DOB:          05/15/1956    Jackson Shaw is in for follow-up.  He really is pretty stable.  He has had his  lithotripsy done.  He does have some tightness going up to the jaw with  activity.  His last catheterization revealed patent grafts, but he does  have some disease posteriorly in the RCA circulation that is not really  amendable to percutaneous intervention.   CURRENT MEDICATIONS:  1. Epivir.  2. Viread,  3. Rayataz.  4. Imdur 30 mg daily.  5. Pravachol 20 mg nightly.  6. Humalog t.i.d.  7. Protonix 40 mg daily.  8. Toprol XL 50 mg daily.  9. Synthroid 200 mcg daily.  10.Aspirin 81 mg.   PHYSICAL EXAMINATION:  VITAL SIGNS:  Blood pressure 138/84, pulse 80.  LUNGS:  Lung fields clear.  CARDIAC:  Rhythm is regular.  EXTREMITIES:  No edema.   EKG reveals normal sinus rhythm, inferior infarct of indeterminate age.  Compared to prior tracings, there is no significant interval change.   Overall, he remains stable.  He has underlying coronary artery disease  and has had bypass surgery.  He is an insulin-dependent diabetic.  He  does have some angina with modest activity, and we have told him he can  increase his Imdur to 45 mg  if that is necessary.  We will see him back  in follow-up in about 6 months.  If there is a progression in symptoms,  he knows to call us.  We have held off on doing repeat catheterization  in part because of his renal function and the fact that his symptoms  come and go.     Arturo Morton. Riley Kill, MD, [facility]  Electronically Signed    TDS/MedQ  DD: 09/16/2007  DT: 09/16/2007  Job #: [id number]

## 2010-10-09 NOTE — Assessment & Plan Note (Signed)
[facility]                            CARDIOLOGY OFFICE NOTE   OLVIN, Jackson Shaw                         MRN:          [id number]  DATE:02/29/2008                            DOB:          [birth year]    CHIEF COMPLAINT:  Chest pain.   HISTORY OF PRESENT ILLNESS:  Jackson Shaw is seen today in a followup  visit.  He has had a fair amount of shortness of breath, and what he  describes as a fair amount of deep throbbing chest discomfort which has  been intermittent.  He has always had some level of chest discomfort,  although he thinks this is somewhat a little bit better.  He has been  told that his creatinine is better and he has remained on thyroid  hormone at 200 mcg, but he is not sure what the exact status of this is  right now with regard to his TSH.  His viral load was apparently up, and  his medicines were readjusted and now his viral load is back down again.  Importantly, the patient last underwent cardiac catheterization in April  2007.  We have been fairly conservative about repeat catheterization.  Part of this has been based upon the angiographic findings in the past.  Importantly, he has been noted to have continued patency of the internal  mammary to the LAD with significant apical LAD disease.  He has also had  continued patency of the saphenous vein graft to the diagonal and obtuse  marginal.  He had a left main stenosis the leads into a small AV portion  of the circumflex and patent saphenous vein graft to the PDA with  occlusion of the distal limb and some disease between the PDA and PLA  was noted.  We thought he had 3 potential sources of ischemia.  These  included the apical LAD and also the area supplied by the left main that  includes proximal septal vessels and the AV circumflex.  The  posterolateral segment of the right was also thought to be a potential  candidate.  Nonetheless, none of this was really a good source.  The  patient has also had a radionuclide imaging in 2005, at that time, he  had a fixed defect in the inferior base, but no other perfusion defects  noted.  We had been concerned about his thyroid abnormality.   CURRENT MEDICATIONS:  Epivir and Viread and Reyataz for his HIV.  He is  on Pravachol 20 mg at bedtime, Humalog at home, Protonix 40 mg daily,  Toprol-XL 50, Synthroid 200 mcg daily, aspirin 81 mg daily and Imdur 45  mg daily.   PAST MEDICAL HISTORY:  Remarkable for coronary artery disease as noted.  The patient has a history of cholelithiasis.  He is HIV positive.  He  has treated dyslipidemia and insulin-dependent diabetes.  He has  hypothyroidism which has been followed in the [facility].  He has a  history of Kaposi sarcoma and ulcerative colitis.  He has had  Pneumocystis pneumonia and chronic anemia.   SOCIAL HISTORY:  The  patient denies alcohol or tobacco use.  He does not  use any illicit drugs at the present time.   MEDICATION HISTORY:   DRUG ALLERGIES:  PENICILLIN and CODEINE.   REVIEW OF SYSTEMS:  Remarkable for no diarrhea, constipation, bloody  stools, cough, fever, chills, other abnormalities.   PHYSICAL EXAMINATION:  GENERAL:  He is alert and oriented, in no  distress.  VITAL SIGNS:  The weight is 173 pounds, blood pressure 125/80, the pulse  is 95.  NECK:  The carotid upstrokes are brisk.  There is no jugular venous  distention.  LUNGS:  The lung fields are clear to auscultation and percussion.  HEART:  The PMI is nondisplaced.  There is a normal first and second  heart sound without murmur, rub, or gallop.  ABDOMEN:  Soft without hepatosplenomegaly.  EXTREMITIES:  No edema.  Pulses are intact.   Electrocardiogram demonstrates normal sinus rhythm essentially within  normal limits.   IMPRESSION:  1. Coronary artery disease, status post coronary bypass graft surgery      with prior anatomy in 2007 as noted.  2. Insulin-dependent diabetes mellitus.  3.  Chronic kidney disease, stage II with abnormalities as noted above.  4. History of human immunodeficiency virus.  5. Hypothyroidism, on thyroid replacement therapy.  6. Hypercholesterolemia.  7. Gastroesophageal reflux.  8. History of renal stones.   PLAN:  Carols and I had had an extensive discussion about his situation  is pretty worried at the present time.  We know he has disease.  We know  that the apical LAD is pretty severely diseased and the distal portion  of the right coronary artery, which is filled retrograde by vein graft  to the PDA is diseased as well.  His left main leading into the  circumflex is also diseased.  He does not have large vessels, and they  are not ideal for percutaneous intervention.  As a result, we feel that  there are obvious issues.  We have talked about the potential options  and he is pushing the concept cardiac catheterization.  I have discussed  this with him and he wants to proceed.  We will bring him in for  hydration prior to the procedure.     Arturo Morton. Riley Kill, MD, [facility]  Electronically Signed    TDS/MedQ  DD: 03/02/2008  DT: 03/03/2008  Job #: [id number]

## 2010-10-09 NOTE — Assessment & Plan Note (Signed)
[facility]                            CARDIOLOGY OFFICE NOTE   JAHN, FRANCHINI                         MRN:          [id number]  DATE:05/13/2007                            DOB:          06-24-1955    Mr. Iodice is in for a follow-up visit.  In general he has been stable.  He does continue to have some intermittent chest pain, and some  shortness of breath.  He has been followed apparently in the Endocrine  clinic at [facility], and there was as previously noted a fair amount  of confusion about his overall thyroid dose and what it should be.  We  are relying on him to be consistent in his follow-up.  The patient  continues to live in [place] and has had kidney stones and for some  reason he apparently was referred from [facility] over to [facility] for  stone crushing, why this occurred the patient does not really understand  and he is trying to find out more about it.  He says recently his viral  load has been undetectable.   MEDICATIONS:  1. Epivir and Viread as well as Reyataz, the dosage of these are      unclear.  2. His Imdur is 30 mg daily.  3. Pravachol 20 mg nightly.  4. Humalog t.i.d.  5. Protonix 40 mg daily.  6. Toprol XL 50 mg daily.  7. Synthroid 200 mcg daily.  8. Aspirin 81 mg daily.   PHYSICAL EXAMINATION:  He is alert and oriented.  His weight is 161  pounds which is stable, blood pressure is 128/79, the pulse is 69.  LUNG:  Fields are clear.  CARDIAC:  Exam is unremarkable.   IMPRESSION:  1. Well-known coronary artery disease, last catheterization September 20, 2005 demonstrating an internal mammary to the left anterior      descending which was widely patent, widely patent sequential      saphenous vein graft to the diagonal and obtuse marginal, 30%      narrowing in the posterior descending artery/PLA vein graft with      stenosis in the atrioventricular portion of the right coronary      artery which fills retrograde  with 3 potential sources of ischemia      including the apical left anterior descending in the posterolateral      segment of the right, and proximal septal vessels.  2. Hypercholesterolemia, on lipid lowering therapy with last lipids      checked at [facility].  3. History of renal stones, which the patient is taking checking into.  4. Hypothyroidism.  5. Insulin dependent diabetes.  6. Human immunodeficiency virus positive.   PLAN:  1. Return to clinic in 3-6 months.  2. No change in overall medical regimen.  We could increase his Imdur      for symptomatic control if necessary.   ADDENDUM:  EKG reveals normal sinus rhythm, there is possible left  atrial enlargement, otherwise unremarkable.  P-wave is not impressive.     Arturo Morton.  Riley Kill, MD, [facility]  Electronically Signed    TDS/MedQ  DD: 06/05/2007  DT: 06/05/2007  Job #: [id number]

## 2010-10-12 NOTE — Cardiovascular Report (Signed)
Jackson Shaw, Jackson Shaw                ACCOUNT NO.:  [id number]   MEDICAL RECORD NO.:  [id number]          PATIENT TYPE:  OIB   LOCATION:  2899                         FACILITY:  [facility]   PHYSICIAN:  Arturo Morton. Riley Kill, M.D. [facility] OF BIRTH:  11/11/1955   DATE OF PROCEDURE:  09/11/2004  DATE OF DISCHARGE:                              CARDIAC CATHETERIZATION   INDICATIONS:  [name] is a 55 year old gentleman who has insulin-dependent  diabetes, and has had previous bypass surgery. He has had chronic angina,  and this seems to have worsened recently. The patient is also HIV-positive  and on antidiarrheal therapy with four drugs. He was brought back to the  catheterization laboratory today for further evaluation. Because of the  history of mild elevation in creatinine, the patient was hydrated prior the  procedure for about 4-5 hours, and most importantly, ventriculography was  not performed. Risks, benefits, alternatives were discussed and known to the  patient.   PROCEDURE:  1.  Left heart catheterization  2.  Selective coronary arteriography.  3.  Saphenous vein graft angiography x2.  4.  Selective left internal mammary angiography.   DESCRIPTION OF PROCEDURE:  The patient was brought to the catheterization  lab and prepped and draped in usual fashion. Through an anterior puncture,  the femoral artery was easily entered, and a 6-French sheath was placed.  Following this, views of the left and right coronaries were obtained. Vein  graft angiography was performed to the saphenous vein graft,  sequentially  to the diagonal and OM. Following this, internal mammary angiography was  performed with a standard right catheter. We had the use a guiding catheter  to get into the right vein graft largely because of development of torque  with trying to navigate underneath the OM graft. Using the right bypass  catheter, we were able to easily get in, take shots of the vein graft. I  then  reviewed the films with Dr. Gerri Spore in detail. The patient was taken  to the holding area in satisfactory condition.   HEMODYNAMIC DATA:  1.  Central aortic pressure 153/88, mean 114.  2.  Left ventricular pressure 157/10.  3.  No gradient on pullback across aortic valve.   ANGIOGRAPHIC DATA:  1.  The left main demonstrates 70 to 75% eccentric stenosis distally.  2.  The LAD has about 80% mid narrowing. There is competitive filling      distally. There is subtotal occlusion of the diagonal as well.  3.  The internal mammary in the distal LAD appears to be widely patent.      There is, however, a 90% stenosis at the apical tip of the LAD leading      into a fairly large inferior branch.  4.  The saphenous vein graft to the diagonal is intact and in goes      sequentially to the first OM. This has an 80% mid narrowing. There is      also ostial disease of this vessel. The A-V circumflex itself is      unprotected and supplies a modest posterolateral branch.  5.  The right coronary artery, as previously noted, is totally occluded in      its midportion.  6.  The saphenous vein graft to the PDA is intact, although I do not see a      posterolateral arm.  This vessel fills the PDA in a retrograde fashion      and into the A-V portion of the vessel which itself has a 95% stenosis      leading into the posterolateral system. One posterolateral branch is      totally occluded and fills by late collaterals. The second      posterolateral branch fills in an antegrade fashion.   CONCLUSIONS:  1.  Continued patency of the left internal mammary to the left anterior      descending with significant distal disease at the apical tip of the      native left anterior descending.  2.  Patent sequential saphenous vein graft to the diagonal and obtuse      marginal.  3.  Distal left main disease leading into a smaller and protected      circumflex.  4.  Patent saphenous vein graft to the posterior  descending artery with      retrograde filling of the posterolateral system and a diseased A-V      portion of the right coronary artery which is not favorable for      percutaneous intervention.   DISCUSSION:  I reviewed the films with Dr. Gerri Spore. The apical LAD has  disease. The continuation portion of the right coronary artery has disease,  and this is accessed through a vein graft to the PDA, retrograde from the  PDA back to the A-V right, and into the posterolateral system. It is pretty  tight stenosis. We might be able to get a balloon into this location, but it  would be difficult at best, and it seems highly unlikely that a stent will  track into this area. In reviewing these with Dr. Gerri Spore, it was our  sense that the patient would still be best managed by medical means. I will  discuss the options with the patient and will review things detail.      TDS/MEDQ  D:  09/11/2004  T:  09/11/2004  Job:  [id number]   cc:   CV Laboratory   [facility]

## 2010-10-12 NOTE — Discharge Summary (Signed)
Jackson Shaw, Jackson Shaw                ACCOUNT NO.:  [id number]   MEDICAL RECORD NO.:  [id number]          PATIENT TYPE:  OIB   LOCATION:  6526                         FACILITY:  [facility]   PHYSICIAN:  Pricilla Riffle, M.D.    DATE OF BIRTH:  [birth year]   DATE OF ADMISSION:  09/20/2005  DATE OF DISCHARGE:  09/21/2005                                 DISCHARGE SUMMARY   ADDENDUM:  As noted above, the patient had cholelithiasis noted on his CT  scan. This can be followed as an outpatient and that will be discussed with  Dr. Riley Kill at his followup appointment next week.      Tereso Newcomer, P.A.    ______________________________  Pricilla Riffle, M.D.    SW/MEDQ  D:  09/21/2005  T:  09/22/2005  Job:  [id number]

## 2010-10-12 NOTE — H&P (Signed)
[place]  Patient:    Jackson Shaw, STANKE Visit Number: [id number] MRN: [id number]          Service Type: MED Location: 1E 0104 01 Attending Physician:  Shelba Flake Dictated by:   Arturo Morton Riley Kill, M.D. [facility] Proc. Date: 06/09/01 Admit Date:  06/09/2001                           History and Physical  HISTORY OF PRESENT ILLNESS:  The patient is a 55 year old white male with a known history of coronary artery disease.  The patient had an acute myocardial infarction in October of 2001 and was done at [facility]; at that time, he had a stent placed in his distal right coronary artery with a 70-80% hazy distal stenosis that was reduced; he also had a 30-40% intermediate stenosis, circumflex which was small and an LAD which had tandem lesions of 50%, 50% and 90% at the apex with an apical LAD that was diffusely diseased by report.  He had a 90% diagonal stenosis as well as an ejection fraction of 40%.  He underwent percutaneous stenting and has done well up until the last three months or so.  He also has HIV and has been followed in the [facility] clinic for nearly 13 years.  With this, he was referred to the cardiology clinic and actually saw one a few weeks ago.  In addition, they put him in on Friday to the emergency room to try to gain access to a cardiologist, which they could not previously gain access to, and because he had no EKG changes or enzyme abnormalities, they elected to send him home and bring him up for an outpatient visit.  The patient lives in [place] but his partner lives here in [place], and because of that, he was in [place], and developed chest pain at 5:20 this morning.  The patient has been disabled.  He desires to stay here for his followup evaluation.  PAST MEDICAL HISTORY: 1. Prior myocardial infarction with known CAD as described above. 2. Diabetes mellitus. 3. Hypothyroidism, on Synthroid replacement. 4. HIV  positive, on multiple drug therapy.  ALLERGIES:  PENICILLIN and CODEINE.  MEDICINES: 1. Zocor 20 mg q.h.s. 2. Plavix 75 mg daily. 3. Synthroid 175 mcg daily. 4. Viread 300 mg q.d. 5. ______ three tablets p.o. b.i.d. 6. Epivir 150 mg b.i.d. 7. Celexa 20 mg. 8. Subcutaneous insulin in a sliding-scale manner.  SOCIAL HISTORY:  The patient lives in [place].  His partner lives here in [place].  He is disabled.  He has never smoked or drank or used any drugs.  FAMILY HISTORY:  His mother died at 35, with the first MI at 52.  His father is 29 and had CAD identified at 52.  In his fathers family, there is early CAD noted.  REVIEW OF SYSTEMS:  The patient has had occasional sweats.  He has had chest pain and shortness of breath.  He has had occasional frequency.  The systems are otherwise negative.  PHYSICAL EXAMINATION:  GENERAL:  He was a well-appearing male in no distress.  VITAL SIGNS:  The blood pressure was 108/57.  The pulse was 70.  The respiratory rate was 16.  Temperature was 97.1.  Oxygen saturation 98%.  NECK:  Carotid upstrokes are brisk.  No carotid bruits noted.  No jugular venous pressure elevation.  LUNGS:  Lung field were clear to auscultation and percussion.  CARDIAC:  The cardiac rhythm was regular without rubs, gallops or murmurs.  ABDOMEN:  Soft without hepatosplenomegaly.  GU:  Exam was deferred.  SKIN:  No rashes.  NEUROLOGIC:  Patient had a nonfocal neurologic examination.  LABORATORY DATA: 1. Chest x-ray:  No active disease or cardiomegaly. 2. EKG:  Normal sinus rhythm, nonspecific T wave abnormality, P-R interval    170 msec, QRS duration 100 msec, QTc 425 msec.  R wave axis is 54. 3. Hemoglobin 11.7, hematocrit 33.4, white count 4600, platelet count 258,000.    BUN 24, creatinine 1.3, glucose 121.  CK-MB 120, MB 1.3, troponin less than    0.01.  IMPRESSION: 1. Probable unstable angina pectoris with known diabetes mellitus and prior     known significant coronary artery disease. 2. Insulin-dependent diabetes mellitus. 3. History of human immunodeficiency virus positivity. 4. Hyperlipidemia.  PLAN: 1. Admit to the hospital. 2. Serial enzymes. 3. Cardiac catheterization. 4. Continue Plavix. 5. The issues have been discussed with the patient.  We suggested the possible    transfer to [facility] to provide for better coordination of his    long-term care; patient also lives in [place].  The patient requested that    he stay at the [facility]. Dictated by:   Arturo Morton Riley Kill, M.D. [facility] Attending Physician:  Shelba Flake DD:  06/09/01 TD:  06/09/01 Job: [id number]

## 2010-10-12 NOTE — Cardiovascular Report (Signed)
NAMEGLENARD, KEESLING                            ACCOUNT NO.:  [id number]   MEDICAL RECORD NO.:  [id number]                   PATIENT TYPE:  OIB   LOCATION:  3729                                 FACILITY:  [facility]   PHYSICIAN:  Arturo Morton. Riley Kill, M.D. [facility]         DATE OF BIRTH:  January 19, 1956   DATE OF PROCEDURE:  09/07/2003  DATE OF DISCHARGE:  09/08/2003                              CARDIAC CATHETERIZATION   INDICATIONS:  [name] is a very delightful 55 year old gentleman who is  HIV positive and has had history of diabetes.  The patient has also had a  fairly complex past medical history.  This has included a history of  ulcerative colitis, insulin dependent diabetes, hypothyroidism, history of  pneumonia, Kaposi sarcoma, and renal insufficiency.  He has undergone  coronary artery bypass graft surgery.  He has very mildly elevated  creatinine.  The current study was done to assess coronary anatomy after he  presented with recurrent angina.  Importantly, his echocardiogram revealed  well preserved left ventricular function.  There was mild anterior leaflet  prolapse with trace mitral regurgitation.  There was also mild aortic root  dilatation noted without significant aortic regurgitation.  Because of  recurrent angina and anginal type symptoms, he was brought to the laboratory  for further evaluation.  He was brought into the catheterization laboratory  early, approximately six hours, for good hydration although his creatinine  really was only 1.6.   PROCEDURES:  1. Left heart catheterization.  2. Selective coronary arteriography.  3. Saphenous vein graft angiography x2.  4. Selective left internal mammary angiography x1.   DESCRIPTION OF PROCEDURE:  The patient was brought to the catheterization  lab and prepped and draped in the usual fashion.  Through an anterior  puncture, the right femoral artery was easily entered.  A 6 French sheath  was placed.  Views of the left and right  coronary arteries were obtained in  multiple angiographic projections.  Ventriculography was not performed.  Left heart catheterization was performed to measure LV pressures.  The  patient tolerated the procedure well and there were no complications.  Vein  graft angiography and internal mammary angiography were also performed as a  part of the procedure.  I then discussed the case with the patient's mother  and father.  He was taken to the holding area in satisfactory clinical  condition.   HEMODYNAMIC DATA:  1. Central aorta:  154/81.  2. Left ventricle:  150/14.  3. No gradient on pullback across the aortic valve.   ANGIOGRAPHIC DATA:  1. No ventriculography was performed as noted above.  On the previous     ventriculogram there was no significant mitral regurgitation and     echocardiography reveals no evidence of mitral regurgitation as well.  2. The left main coronary artery has about 70% eccentric stenosis.  3. The left anterior descending artery courses to the apex.  There  is     probably diffuse disease in the mid vessel.  There is competitive filling     of the distal vessel from an internal mammary vessel.  There is a first     diagonal that is totally occluded.  4. The internal mammary to the distal LAD is widely patent.  There is     vigorous flow into the distal LAD which is relatively smooth although     there is 80-90% apical stenosis most of which had been noted on the     previous study.  5. The circumflex has a first marginal branch which is about 70% ostial     narrowing.  There is a somewhat smaller AV circumflex and appears to have     some diffuse disease at the takeoff with 50-70% narrowing distally.  6. There is a saphenous vein graft that inserts into the diagonal and the OM-     1.  This vein graft is widely patent with venous valve noted in the mid     vessel.  7. The right coronary artery is diffusely diseased and then totally     occluded.  8. The  saphenous vein graft to the posterior descending artery is patent.     The second limb is occluded.  Therefore, the posterior lateral system     fills by late filling retrograde into the PDA and then into the posterior     lateral system which has a 90% segmental stenosis.  A subbranch of the     posterior lateral branch also has 90% stenosis.  This likely is the     source of angina.   CONCLUSION:  1. Preserved left ventricular function by echocardiography.  2. Continued patency of the internal mammary to the left anterior descending     with an apical stenosis.  3. Continued patency of the saphenous vein graft to the diagonal and OM-1.  4. Continued patency of the saphenous vein graft to the posterior descending     artery with occlusion of the distal limb resulting in probably inadequate     flow to the moderate size posterior lateral branch.   DISPOSITION:  At the present time, we would likely recommend medicine.  It  is possible that an attempt could be made to approach the posterior lateral  branch through this graft, but it was not necessarily be easy.  Moreover,  the patient has diabetes and therefore risk of restenosis would be higher.  We will discuss the options with the patient.                                               Arturo Morton. Riley Kill, M.D. [facility]    TDS/MEDQ  D:  09/07/2003  T:  09/08/2003  Job:  [id number]

## 2010-10-12 NOTE — Assessment & Plan Note (Signed)
[facility]                              CARDIOLOGY OFFICE NOTE   Jackson Shaw, Jackson Shaw                         MRN:          [id number]  DATE:02/12/2006                            DOB:          1955/12/09    Jackson Shaw is in for followup.  He has had recent chest discomfort.  He had  an episode 2 days ago.  He also has had a recent colonoscopy.  He was told  that he had diverticular disease.  He had missed his appointment with  endocrinology because the appointment was cancelled.  Following this, he has  been told that he cannot get back in because of Medicaid.  After that he was  in [place] and did not re-call to reschedule.  He has continued to have  some weight loss and has dropped from 175 down to 168.  He feels somewhat  fatigued.  Importantly, the patient had a low TSH and we dropped his  Synthroid.  He was supposed to have followup at the endocrine clinic at  [facility], but what occurred is as documented on the note.   PHYSICAL DATA:  Blood pressure is 110/72, pulse 72.  LUNGS:  Fields were clear.  CARDIAC:  Rhythm was regular.   Electrocardiogram demonstrates normal sinus rhythm, within normal limits.   IMPRESSION:  1. Insulin dependent diabetes mellitus.  2. Human immunodeficiency virus positive.  Human immunodeficiency      virus/Acquired immune deficiency syndrome on medical therapy.  3. Last catheterization September 20, 2005 demonstrating patency of the      internal mammary artery with significant apical left anterior      descending disease.  Continued patency of the saphenous vein graft to      the diagonal and obtuse marginal, left main stenosis that leads to a      smaller atrioventricular portion of the circumflex and patent saphenous      vein graft to the posterior descending artery with occlusion of the      distal limb and some disease between the posterior descending artery      and postero-lateral artery is noted.   DISPOSITION:  At the present time he has had some recurrent episodes of  chest pain but does appear stable.  He is on a medical regimen currently.  Unfortunately, he may be hyperthyroid with the significant weight loss.  His  thyroid dose clearly appears to be too high.  It has been adjusted  backwards, but his TSH has not been rechecked until yesterday in the [facility]  clinic and they are supposed to call him this afternoon, and he is supposed  to relay that information to [place].  I will see him back in followup in 4  months.  Continued medical therapy is warranted.                              Arturo Morton. Riley Kill, MD, [facility]    TDS/MedQ  DD:  02/12/2006  DT:  02/13/2006  Job #:  [id number] 

## 2010-10-12 NOTE — Op Note (Signed)
[place]  Patient:    Jackson Shaw, Jackson Shaw Visit Number: [id number] MRN: [id number]          Service Type: SUR Location: 2300 2307 [id number] Attending Physician:  Charlett Lango Dictated by:   Salvatore Decent. Dorris Fetch, M.D. Proc. Date: 06/23/01 Admit Date:  06/23/2001   CC:         Dr. Simona Huh  Dr. Acey Lav   Operative Report  PREOPERATIVE DIAGNOSIS:  Three-vessel coronary disease, status post non-Q wave myocardial infarction and left main disease.  POSTOPERATIVE DIAGNOSIS:  Three-vessel coronary disease, status post non-Q wave myocardial infarction and left main disease.  OPERATION PERFORMED:  Median sternotomy, extracorporeal circulation.  Coronary artery bypass grafting x 5 (left internal mammary artery to left anterior descending, sequential saphenous vein graft to diagonal 1 and obtuse marginal 1, sequential saphenous vein graft to posterior descending and posterolateral).  SURGEON:  Salvatore Decent. Dorris Fetch, M.D.  ASSISTANT:  Adair Patter, P.A.  ANESTHESIA:  General.  OPERATIVE FINDINGS:  LAD, diagonal and OM1 good quality targets.  Posterior descending and posterolateral fair quality targets.  Good quality vein grafts, good quality mammary artery.  Preserved left ventricular function.  INDICATIONS FOR PROCEDURE:  The patient is a 55 year old gentleman with a history of insulin-dependent diabetes mellitus, hyperlipidemia and HIV positive.  He presented following a non-Q wave myocardial infarction.  He underwent cardiac catheterization which revealed left main disease as well as three vessel coronary disease and was advised to undergo coronary artery bypass grafting for survival benefit and relief of symptoms.  The patient had stabilized after his non-Q wave myocardial infarction and wished to see his infectious disease physician, Dr. Alvester Morin at [facility] prior to undergoing surgery.  The patient was advised of the risks of  waiting.  He understood that, but wished to proceed.  At the patients request, the surgery was scheduled for today.  The patient understood the indications, risks, benefits and alternatives to the procedure and accepted the risks and agreed to proceed.  DESCRIPTION OF PROCEDURE:  The patient was brought to the preop holding area on June 23, 2001.  Prior top placing lines, I was notified by the blood bank of the potential shortage of B- blood.  Five units were available if needed. I discussed this in detail with the patient.  He understood that there was a very small risk of having inadequate blood supply to meet his his needs if we were to have severe bleeding complications, but accepted that risk and agreed to proceed.  Lines were placed by the anesthesia service to monitor arterial, central venous and pulmonary arterial pressure.  EKG leads were placed for continuous telemetry.  The patient was taken to the operating room, anesthetized and intubated.  A Foley catheter was placed.  Intravenous antibiotics were administered.  The chest, abdomen and legs were prepped and draped in the usual fashion.  A median sternotomy was performed and the left internal mammary artery was harvested in the standard fashion.  The patient was not a candidate for bilateral mammaries because of his diabetes and not a candidate for radial artery harvest because of positive Allen test bilaterally.  The left internal mammary artery was an excellent quality vessel.  It was harvested in the standard fashion.  The patient was fully heparinized prior to dividing the distal end of the mammary artery.  There was excellent flow to the cut end of the vessel.  Simultaneously an incision was made in the medial aspect of  the right leg at the ankle.  The greater saphenous vein was identified but was very small at that location.  It was cannulated but did not dilate.  Therefore, that incision was closed and the vein was  harvested from the right thigh.  The vein in that location was of very good quality.  After harvesting the conduits, the pericardium was opened. The ascending aorta was inspected and palpated.  There was no palpable atherosclerotic disease.  The aorta was cannulated via concentric, 2-0 Ethibond pledgeted pursestring sutures.  A dual state venous cannula was placed via pursestring suture in the right atrial appendage.  Cardiopulmonary bypass was instituted.  The patient was cooled to 32 degrees Celsius.  The coronary arteries were inspected and anastomotic sites were chosen.  The conduits were inspected and cut to length.  A foam pad was placed in the pericardium to protect the left phrenic nerve.  A temperature probe was placed in the myocardial septum and the cardioplegia cannula was placed in the ascending aorta.  The aorta was crossclamped.  The left ventricle was emptied via the aortic root vent.  Cardiac arrest then was achieved with a combination of cold antegrade blood cardioplegia and topical iced saline.  After achieving a complete diastolic arrest and myocardial septal temperature of 54 degrees Celsius, the following distal anastomoses were performed.  First, a reversed saphenous vein graft was placed end-to-side to the posterior descending coronary artery and posterolateral branches of the right coronary artery.  The saphenous vein was of good quality.  A side-to-side anastomosis was performed to the posterior descending coronary artery which was a 1.5 mm fair quality vessel.  There was diffuse disease within it.  The posterolateral was also a 1.5 mm vessel of fair quality with diffuse disease and end-to-side anastomosis was performed to the posterolateral.  Both anastomoses were performed with running 7-0 Prolene sutures.  There was excellent flow through the graft.  Cardioplegia was administered and there was good hemostasis at both anastomoses.   Next a reversed saphenous  vein graft was placed sequentially to the first diagonal branch of the LAD and the first obtuse marginal branch of the left circumflex.  Both of these vessels were 2 mm good quality targets and both were superficially intramyocardial at the site of the anastomosis and became deeply intramyocardial beyond that.  Again the vein graft was of good quality. A side-to-side anastomosis was performed to the first diagonal and an end-to-side anastomosis was performed to the first obtuse marginal.  Again both were performed with running 7-0 Prolene sutures.  Again there was excellent flow through this graft as well.  Cardioplegia was administered and there was good hemostasis.  Next, the left internal mammary artery was brought through a window in the pericardium.  The distal end was spatulated.  It was a 2.5 mm good quality conduit.  The LAD was a 2.2 mm good quality target.  The anastomosis was performed with a running 8-0 Prolene suture in end-to-side fashion.  At the completion of the mammary to LAD anastomosis, the bulldog clamp was briefly removed to inspect for hemostasis.  It then was replaced.  There was septal rewarming during that time and an excellent flash of blood distally in the LAD.  Additional cardioplegia was administered and the vein grafts were cut to length.  The proximal vein graft anastomoses were then performed to 4.4 mm punch aortotomies with running 6-0 Prolene sutures while under crossclamp.  At the completion of the final proximal anastomosis.  The bulldog clamp was once again removed from the mammary.  Immediate and rapid septal rewarming was once again noted.  Lidocaine was administered.  The patient was placed in Trendelenburg position.  The aortic root was deaired and the crossclamp was removed.  The total crossclamp time was 79 minutes.  The patient was being rewarmed during this time.  All proximal and distal anastomoses were inspected for hemostasis.   Epicardial pacing wires were placed on the right ventricle and right atrium.  When the patient had been rewarmed to a core temperature of 37 degrees Celsius, he was weaned from cardiopulmonary bypass.  Total bypass time was 112 minutes.  The initial cardiac index was greater than 2L per minute per meter squared.  The patient was on no inotropic support at the time of separation of bypass.  A test dose of protamine was administered and was well tolerated.  The atrial and aortic cannulae were removed.  The remainder of the protamine was administered without incident.  The chest was irrigated with 1L of warm normal saline containing 1 gm of vancomycin.  Hemostasis was achieved.  Blood was noted to be welling in the inferior portion of the pericardium.  The heart was gently retracted to inspect the inferior anastomoses.  The bleeding was determined to be from rundown from the sternum.  However, the patient went into ventricular tachycardia with manipulation of the heart and required defibrillation.  The patient then remained in sinus rhythm thereafter with no additional arrhythmias.  A left pleural and two mediastinal chest tubes were placed through separate subcostal incisions.  The sternum was closed with heavy gauge interrupted stainless steel wires.  The pectoralis fascia was closed with running #1 Vicryl suture.  The subcutaneous tissues and skin were closed in standard fashion with subcuticular skin closures in both the chest and the leg.  The patient remained hemodynamically stable throughout this period.  He was taken from the operating room to the surgical intensive care unit in stable condition. Dictated by:   Salvatore Decent Dorris Fetch, M.D. Attending Physician:  Charlett Lango DD:  06/23/01 TD:  06/24/01 Job: [id number]

## 2010-10-12 NOTE — Assessment & Plan Note (Signed)
Jackson Shaw                            CARDIOLOGY OFFICE NOTE   Jackson Shaw, Jackson Shaw                         MRN:          [id number]  DATE:09/22/2006                            DOB:          08/31/1955    [name] is in for follow up.  In general, he has had a little bit more  chest pain recently.  Of note, in April of the past 2 years, he has had  a fair amount of chest discomfort, and he underwent cardiac  catheterization.  We were able to identify sources of ischemia.  These  included the native circumflex which is protected by a left main, a  distal LAD well beyond the graft insertion site, and also the  continuation branch of the right coronary where the vein graft inserts  into the PDA and then retrograde fills the posterolateral system, which  has a total occlusion.  In general, he also is not quite aware of his  thyroid status, although apparently his TSH was increased again on 175  mcg, and so was therefore increased, although he does not know his dose  currently today.  He has had a modest chest pain.  He has also had some  weight loss, and he has had very poor appetite, as well as some  shortness of breath.   PHYSICAL EXAMINATION:  VITAL SIGNS:  Today on examination, the weight is  178 pounds, blood pressure 138/92, pulse 61.  LUNGS:  The lung fields are clear to auscultation and percussion.  CARDIAC:  Regular.   ELECTROCARDIOGRAM:  The electrocardiogram demonstrates normal sinus  rhythm and is within normal limits other than minor nonspecific T wave  abnormalities.   Clearly, he has had more symptoms.  He need to potentially find out what  his TSHs have been, and I have asked him.  He is scheduled to see his  endocrinologist in the very near future.  With his weight down, it  raises the issue of whether he is hyperthyroid again.  Importantly, I am  also concerned about his symptoms.  If he continues to have symptoms for  another month  or so, then we may consider going back in and doing  another catheterization, although I have been a little bit reluctant, as  there has not been a major change, and there has not been high-grade  vein graft disease previously.  However, he is at risk for all of this.  I plan to see him back in follow up in 4 weeks, at which time we will  reassess his status.     Jackson Shaw. Jackson Kill, MD, [facility]  Electronically Signed    TDS/MedQ  DD: 09/22/2006  DT: 09/23/2006  Job #: [id number]

## 2010-10-12 NOTE — H&P (Signed)
[place]. [facility]  Patient:    Jackson Shaw, Jackson Shaw Visit Number: [id number] MRN: [id number]          Service Type: SUR Location: 2300 2307 [id number] Attending Physician:  Charlett Lango Dictated by:   Adair Patter, P.A.-C. Admit Date:  06/23/2001                           History and Physical  CHIEF COMPLAINT:  Coronary artery disease.  HISTORY OF PRESENT ILLNESS:  This is a 55 year old white male who states that approximately two weeks ago he suffered a myocardial infarction.  Two days following his myocardial infarction he underwent a cardiac catheterization which showed that the patient had significant coronary artery disease, admitted for surgical correction.  Please see Dr. Viviann Spare C. Hendricksons consultation.  He states that during this episode he was awakened from his sleep with chest pain.  It felt like someone was sitting on his chest.  He states that the pain radiated down both arms and into his jaw.  He also had associated shortness of breath and nausea with this episode.  The patient further relates that in 2001, he had a previous myocardial infarction, for which he underwent a cardiac catheterization and stent placement.  He was symptom-free for approximately one year, but over the past five months before his most recent myocardial infarction, he began having chest pain on and off, for which he was prescribed sublingual nitroglycerin.  No other interventions were taken for this at that time.  The patient denies any resting shortness of breath, orthopnea, lower extremity edema, palpitations, syncope, or presyncope.  PAST MEDICAL HISTORY 1. HIV x 13 years. 2. Type 1 diabetes mellitus. 3. Hypothyroidism. 4. Kaposis sarcoma. 5. History of leukemia. 6. Ulcerative colitis. 7. Anemia. 8. History of Pneumocystis pneumonia. 9. Hyperlipidemia.  PAST SURGICAL HISTORY 1. Unknown orthopedic procedures, both feet as a child. 2. The patient has  undergone several colonoscopies. 3. The patient has undergone several bronchoscopies.  ALLERGIES:  The patient is allergic to PENICILLIN, which causes anaphylaxis. CODEINE, which causes nausea.  CURRENT MEDICATIONS 1. Synthroid 0.175 mg p.o. q.d. 2. Viracil 300 mg p.o. q.d. 3. Epivir 300 mg p.o. q.d. 4. Zocor 20 mg p.o. q.d. 5. Axid one p.o. q.d. 6. Celexa 5 mg p.o. q.d. 7. Kaletra three p.o. b.i.d. 8. Plavix 75 mg p.o. q.d., which was stopped five days ago. 9. Humalog insulin on a sliding scale for control of diabetes mellitus.  FAMILY HISTORY:  The patient has an extensive family history of coronary artery disease.  The mother is still living at age 17.  She had a myocardial infarction and a coronary artery bypass graft.  Father died at age 45 of a myocardial infarction.  He has a sister who is 55 years old who has diabetes mellitus.  SOCIAL HISTORY:  The patient is single.  He denies alcohol or tobacco use.  He denies any illicit drug use.  REVIEW OF SYSTEMS:  GENERAL:  The patient denies any fever, chills, or night sweats.  HEENT:  Head:  The patient denies any head injuries.  Eyes:  The patient wears corrective lenses.  Denies any glaucoma or cataracts.  Ears: The patient denies any vertigo, tinnitus, hearing loss, or ear infections.  Nose: The patient denies any sinusitis or rhinitis or epistaxis.  Mouth:  The patient denies any exudates or problems with dentition or frequent sore throats.  NECK:  The patient has several swollen  lymph nodes, secondary to HIV.  He denies any pain on range of motion of his neck.  CARDIOVASCULAR:  The patient has a history of a myocardial infarction.  He denies any hypertension or cardiac arrhythmias.  PULMONARY:  The patient has a history of Pneumocystis pneumonia, secondary to HIV.  He denies any bronchitis or emphysema or asthma. GI:  The patient denies any nausea, vomiting, diarrhea, constipation, hematochezia, or melena.  He does have a  history of ulcerative colitis. GENITOURINARY:  The patient denies any impotence or urinary incontinence or urinary tract infections.  EXTREMITIES:  The patient denies any arthralgias or myalgias.  NEUROLOGIC:  The patient denies any CIA or TIA.  No memory loss.  PHYSICAL EXAMINATION  VITAL SIGNS:  Blood pressure 110/70, pulse 68 and regular, respirations 16.  GENERAL:  The patient is alert and oriented x 3.  He is in no distress.  HEENT:  Head ia normocephalic, atraumatic.  Eyes:  Pupils equal, round, reactive to light and accommodation.  Extraocular motions are intact, without scleral icterus or nystagmus.  Ears:  Auditory acuity is grossly intact. Nose:  Nasal patency intact.  Sinuses are nontender.  Mouth is moist, no exudates.  NECK:  Supple without jugular venous distention or carotid bruits.  There is some cervical lymphadenopathy present.  CARDIOVASCULAR:  A regular rate and rhythm, without murmurs, gallops, or lungs.  LUNGS:  Bilaterally clear to auscultation, without rales, rhonchi, or wheezes.  ABDOMEN:  Soft, nontender, nondistended.  Positive bowel sounds in all four quadrants.  EXTREMITIES:  No clubbing, cyanosis, or edema.  The extremities were warm.  PERIPHERAL PULSES:  Revealed the patient had 2+ carotid, femoral, and dorsalis pedis pulses bilaterally.  NEUROLOGIC:  No focal neurological deficits.  The patient had a steady gait which was unaided by assistive devices.  He had 5+ and equal strength in all extremities.  Cranial nerves II-XII grossly intact.  ASSESSMENT:  Coronary artery disease.  PLAN:  The patient will undergo a coronary artery bypass graft by Dr. Dorris Fetch on June 23, 2001. Dictated by:   Adair Patter, P.A.-C. Attending Physician:  Charlett Lango DD:  06/22/01 TD:  06/22/01 Job: [id number]

## 2010-10-12 NOTE — Discharge Summary (Signed)
[place]. [facility]  Patient:    Jackson Shaw, Jackson Shaw Visit Number: [id number] MRN: [id number]          Service Type: SUR Location: 2000 2018 01 Attending Physician:  Charlett Lango Dictated by:   Dominica Severin, P.A. Admit Date:  06/23/2001 Disc. Date: 06/27/01   CC:         Arturo Morton. Riley Kill, M.D. [facility], [place], Infectious Disease, Acey Lav, M.D.   Discharge Summary  DATE OF BIRTH:  1955-07-25  PRIMARY ADMISSION DIAGNOSIS:  Coronary artery disease, status post non-Q wave myocardial infarction on June 08, 2000.  SECONDARY DIAGNOSES/PAST MEDICAL HISTORY:  1. Human immunodeficiency virus x13 years.  2. Type 1 diabetes mellitus.  3. Hypothyroidism.  4. Kaposis sarcoma.  5. History of leukemia.  6. Ulcerative colitis.  7. Anemia.  8. History of Pneumocystis pneumonia.  9. Hyperlipidemia. 10. Allergy to PENICILLIN causing anaphylaxis.  NEW DIAGNOSES/DISCHARGE DIAGNOSES:  1. Status post coronary artery bypass graft surgery.  2. Postoperative volume overload, resolving.  PROCEDURES:  1. Coronary artery bypass graft surgery x5 with the following grafts placed:     Left internal mammary artery to the left anterior descending artery,     sequential saphenous vein graft to posterior descending and posterolateral     branch, sequential saphenous vein graft to the diagonal #1 and obtuse     marginal #1 branch; done on June 23, 2001.  2. Intraoperative transfusion of 2 units of packed red blood cells, as well     as postoperative transfusion of 2 units of packed red blood cells.  HOSPITAL COURSE:  This patient is a 55 year old Caucasian male with a history of insulin-dependent diabetes mellitus, hyperlipidemia, and HIV positive infection.  He presented following a non-Q wave myocardial infarction.  He underwent cardiac catheterization which revealed left main disease, as well as three-vessel coronary artery  disease.  He was advised to undergo coronary artery bypass grafting for survival benefit and relief of symptoms.  The patient had been stabilized after his non-Q wave MI and he wished to see infectious disease physician, Dr. Alvester Morin, at [facility] prior to undergoing surgery.  The patient was advised of the risks of waiting.  He understood that and wished to proceed.  At the patients request, the surgery was scheduled for June 23, 2001.  He tolerated the procedure well.  His postoperative course was notable for some anemia, which he was transfused with 2 units intraoperatively, as well as postoperatively.  In addition, he had some postoperative volume overload, which is resolving.  Otherwise, he remained hemodynamically stable.  He was extubated later on June 23, 2001.  His respiratory status remained stable throughout his hospital stay.  He was tolerating his diet without difficulty.  He was ambulating without difficulty with cardiac rehab Phase I and his incisions had remained clean and dry without any signs of an infection or complications.  He is anticipated for discharge the morning of June 27, 2001.  He will be discharged home with his friend and his mother being there for 24-hour supervision.  DISCHARGE CONDITION:  Stable.  DISCHARGE MEDICATIONS:  1. Synthroid 0.175 mcg daily.  2. Zocor 20 mg at bedtime.  3. Epivir 150 mg p.o. b.i.d. w.c.  4. Viread 300 mg p.o. daily w.c.  5. Celexa 20 mg p.o. daily.  6. Coated aspirin 325 mg daily.  7. Lopressor 25 mg twice a day.  8. Lasix 40 mg daily x14  days.  9. Potassium chloride 20 mEq daily x14 days. 10. Sliding scale of insulin regular Humulin R at home dose. 11. Lantus insulin 10 units subcu q.h.s. 12. Tylox 1-2 tablets every four hours as needed for pain. 13. Axid daily.  ACTIVITY AND FOLLOWUP:  The patient is instructed not to do any driving, heavy lifting, or strenuous activity.  He is to walk daily and continue  breathing exercises.  He is to follow a low-fat/low-sodium diet, as well as diabetic ADA 1800-calorie diet.  He was told he could shower, clean his wounds with mild soap and water, and call the office if any wound problems arise as noted on the fact sheet.  The patient was given a cardiac surgery fact sheet.  He is to follow up with Dr. Riley Kill on July 10, 2001 at 9 a.m.  He is to have a chest x-ray taken by his cardiologist and bring that chest x-ray with him to his appointment with Dr. Dorris Fetch on Wednesday, February 19, at 9:30 a.m. He is also instructed to follow up with Dr. Alvester Morin as needed. Dictated by:   Dominica Severin, P.A. Attending Physician:  Charlett Lango DD:  06/26/01 TD:  06/26/01 Job: [id number]

## 2010-10-12 NOTE — Cardiovascular Report (Signed)
NAMEARMARION, GREEK                ACCOUNT NO.:  [id number]   MEDICAL RECORD NO.:  [id number]          PATIENT TYPE:  OIB   LOCATION:  2807                         FACILITY:  [facility]   PHYSICIAN:  Arturo Morton. Riley Kill, M.D. [facility] OF BIRTH:  Jul 22, 1955   DATE OF PROCEDURE:  09/20/2005  DATE OF DISCHARGE:                              CARDIAC CATHETERIZATION   INDICATIONS:  Mr. Spraggins is a 55 year old gentleman well known to me.  The  patient has a history of HIV and has been on multiple medications.  He had  revascularization surgery previously.  The current study was done to assess  coronary anatomy.  He last underwent catheterization one year earlier.  The  patient also has some renal insufficiency and was brought in earlier today  for IV hydration prior to the catheterization procedure.  The risks,  benefits, and alternatives were discussed with the patient. The main  clinical indication for catheterization was recurrent and progressive chest  discomfort.   PROCEDURE:  1.  Left heart catheterization  2.  Selective coronary territory.  3.  Saphenous vein graft angiography.  4.  Selective left internal mammary angiography.   DESCRIPTION OF PROCEDURE:  The patient was brought to the catheterization  laboratory, prepped and draped in usual fashion. Through an anterior  puncture, the right femoral artery was easily entered and a 6-French sheath  was placed.  Views of the left and right coronaries were obtained. Vein  graft angiography of the OM diagonal graft was obtained using a standard RCA  catheter. Internal mammary angiography was performed with a standard RCA  catheter. A right bypass catheter was required for the RCA bypass.  The  patient tolerated procedure without complication and is taken to the holding  area in satisfactory clinical condition.  I reviewed the films with his  mother and sister in detail.  I then discussed the case with the case  patient who was in agreement  with reviewing the films with his family.  There were no complications.   HEMODYNAMIC DATA:  1.  Central aortic pressure 149/93, mean 116.  2.  Left atrial pressure 149/10.  3.  No gradient on pullback across aortic valve.   ANGIOGRAPHIC DATA:  1.  The left main coronary artery and has about an 80% eccentric stenosis at      its distal most aspect.  2.  This leads into a left anterior descending artery that has some diffuse      irregularity.  We cannot really see the distal LAD very well.  The      proximal LAD shows a diagonal that is occluded.  The mid LAD courses      into a second diagonal branch.  3.  The internal mammary to the LAD proper is widely patent.  There is,      however, an apical 90% stenosis which appears similar to the previous      studies.  4.  The circumflex provides an intermediate vessel with about 75% tandem      lesions, the AV circumflex has about 70% narrowing.  5.  There is a sequential saphenous vein graft that goes to the major      diagonal and obtuse marginal branch and it appears to be widely patent.  6.  The right coronary artery has 80% narrowing proximally, 50-70% segmental      disease in the mid vessel, then is totally occluded.  7.  The saphenous vein graft to the PDA and PLA demonstrates about 30%      narrowing in the proximal mid portion.  The PDA insertion is excellent.      There is no visible vessel to the posterolateral artery seen beyond      this.  Importantly, this fills the PDA retrograde into the AV portion of      the right coronary artery.  The AV portion of the right coronary artery      after a sub-branch has an 80% area of stenosis.  There is a second sub-      branch with about 90% narrowing and then, there is a large      posterolateral branch which was unsupplied.   CONCLUSION:  1.  Continued patency of the internal mammary to the LAD with significant      apical LAD disease.  2.  Continued patency of saphenous vein  graft to the diagonal and obtuse      marginal.  3.  Left main stenosis that leads into a smaller AV portion of the      circumflex artery.  4.  Patent saphenous vein graft to the PDA with occlusion of the distal limb      and some disease between the PDA and PLA is noted.   DISPOSITION:  There are three potential sources of ischemia.  These include  the apical LAD, also, the area supplied by the left main that includes the  proximal septal vessels as well as the AV circumflex, and also the  posterolateral segment of the right.  The patient is not a good candidate  for revascularizations surgery.  The exact etiology of his chest pain is  unclear.  I plan to pursue an adenosine Cardiolite to best assess locale of  ischemia.  In the absence of significant ischemia, we may look for other  sources of chest discomfort.      Arturo Morton. Riley Kill, M.D. [facility]  Electronically Signed     TDS/MEDQ  D:  09/20/2005  T:  09/21/2005  Job:  [id number]

## 2010-10-12 NOTE — Discharge Summary (Signed)
[place]  Patient:    Jackson Shaw, Jackson Shaw Visit Number: [id number] MRN: [id number]          Service Type: MED Location: [phone] Attending Physician:  Jonelle Sidle Dictated by:   Guy Franco, P.A. Admit Date:  06/11/2001   CC:         Salvatore Decent. Dorris Fetch, M.D.   Referring Physician Discharge [place]  DISCHARGE DIAGNOSES: 1. Non-Q-wave myocardial infarction. 2. Coronary artery disease. 3. Hyperlipidemia. 4. 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 5. Diabetes mellitus. 6. Hypothyroidism.  HOSPITAL COURSE:  Mr. Halbig was admitted on June 09, 2001 at [facility] with substernal chest pain.  Lab studies did reveal an elevated troponin consistent with non-Q-wave MI.  He was then transferred [facility] for cardiac catheterization and this revealed left main with distal 60%; LAD 40% mid, 80% apical; 80% large first diagonal lesion; circumflex with minor irregularities; ramus 30%; RCA with a 40-50% proximal lesion, 90% distal, 30% PLE.  EF was 45-50%.  The procedure was performed by Dr. Simona Huh.  At this point, a CVTS consultation was obtained and Dr. Dorris Fetch agreed that the patient would benefit from bypass surgery.  The patient wants to wait until June 23, 2001 despite our encouragement to stay in the hospital and proceed with this therapy.  He is instructed to return immediately with any further substernal chest pain.  The patient also wishes to follow up with his infectious disease physicians at [facility] to try to maximize his health status with his underlying HIV/AIDS.  LABORATORY DATA:  Discharge chest x-ray revealed no active disease.  Hemoglobin 10.3, hematocrit 29.3, white count 4.3, platelets 230.  Protime 12.4, INR 0.9.  Sodium 137, potassium 4.2, chloride 108, CO2 26, BUN 20, creatinine 1.4.  Total cholesterol 136, triglycerides 194, LDL 71, HDL 26. Maximum  troponin 0.14.  TSH 2.454.  DISCHARGE MEDICATIONS:  1. Enteric-coated aspirin 325 mg one p.o. q.d.  2. Plavix 75 mg one p.o. q.d.  3. Celexa 20 mg one p.o. q.d.  4. Synthroid 175 mcg one p.o. q.d.  5. Zocor 20 mg one p.o. q.d.  6. Imdur 30 mg one p.o. q.d.  7. Toprol-XL 25 mg one-half tablet p.o. q.d.  8. Sliding scale insulin.  9. Kaletra 133.3/33.3 mg three capsules b.i.d. 10. Viread 300 mg q.d. 11. Epivir 150 mg p.o. b.i.d.  ACTIVITY:  Includes no strenuous activity.  DIET:  He is to remain on an 1800 calorie ADA diet.  WOUND CARE:  Clean over his catheterization site with soap and water.  FOLLOW-UP:  He has an appointment to follow up at CVTS on Monday, June 22, 2001 at 1 p.m. for his admission workup and labs will also be performed on that day.  He is scheduled for the operating room with Dr. Dorris Fetch on Tuesday, June 23, 2001 for CABG. Dictated by:   Guy Franco, P.A. Attending Physician:  Jonelle Sidle DD:  06/12/01 TD:  06/12/01 Job: [id number]

## 2010-10-12 NOTE — Discharge Summary (Signed)
Jackson Shaw, Jackson Shaw                ACCOUNT NO.:  [id number]   MEDICAL RECORD NO.:  [id number]          PATIENT TYPE:  OIB   LOCATION:  6526                         FACILITY:  [facility]   PHYSICIAN:  Pricilla Riffle, M.D.    DATE OF BIRTH:  12/20/1955   DATE OF ADMISSION:  09/20/2005  DATE OF DISCHARGE:                                 DISCHARGE SUMMARY   DISCHARGE DIAGNOSES:  1.  Chest pain.  2.  Coronary artery disease.      1.  Status post coronary artery bypass graft.      2.  Catheterization this admission, revealing patent LIMA to the LAD          with distal disease, patent vein graft to the diagonal and obtuse          marginal, patent vein graft to the PDA, with occlusion of the          posterolateral limb, with disease in the AV portion of the RCA, and          80% stenosis in the left main, leading to the proximal LAD.  3.  Cholelithiasis, diagnosed by abdominal CT this admission.  4.  HIV positive status.  5.  Treated dyslipidemia.  6.  Diabetes mellitus.  7.  Treated hypothyroidism.  8.  History of Kaposi's sarcoma.  9.  History of leukemia.  10. History of ulcerative colitis.  11. History of pneumocystis pneumoniae.  12. History of anemia.   PROCEDURE PERFORMED:  Cardiac catheterization by Dr. Shawnie Pons.   BRIEF HISTORY:  Please see the dictated H&P by Dr. Shawnie Pons for  complete details.  Briefly, this 55 year old male, known to Dr. Riley Kill,  with a history of CABG in 2003, had recurrent chest pain and was admitted  for further evaluation.  He underwent cardiac catheterization by Dr.  Riley Kill.  The results are noted above.  First, Dr. Riley Kill considered  performing a nuclear study for look for ischemia, as it was not clear if his  anatomy was contributing to his symptoms.  However, the patient continued to  complain of some other symptoms, including abdominal pain, and a CT scan was  performed.  The CT scan did come back showing multiple small gallstones.  One that could be in the distal common bile duct.  There was no current  obstruction.  There was no cholecystitis.  There was no pancreatitis.  There  was noted sigmoid colon diverticulosis, and the chest portion of the CT had  no acute process.  Dr. Tenny Craw had seen the patient and she felt that he was  stable enough for discharge to home.  The patient will have very close  follow up with Dr. Riley Kill early next week.   LABORATORY DATA:  White count 5000, hemoglobin 13.5, hematocrit 39.2,  platelet count 201,000.  INR 0.9.  Sodium 134, potassium 4.1, chloride 103,  CO2 25, glucose 174, BUN 18, creatinine 1.5.  TSH 0.110.  A chest x-ray  showed no active lung disease.   DISCHARGE MEDICATIONS:  1.  Aspirin 325 mg daily.  2.  Rayataz as taken previously.  3.  Norvir as taken previously.  4.  Truvada as taken previously.  5.  Imdur 30 mg daily.  6.  Pravachol 20 mg daily.  7.  Synthroid.  The patient's dose of Synthroid will be decreased to 175 mcg      from 200 mg daily secondary to his low TSH and he will need follow up      with his primary care physician.  8.  Protonix 40 mg daily.  9.  __________  mg daily.  10. Toprol-XL 50 mg daily.  11. Sliding scale insulin.  12. Nitroglycerin p.r.n. chest pain.   ACTIVITY:  The patient is to do no driving for three days, no heavy lifting  for three days.   DIET:  Maintain a low-fat, low-sodium diet.   DISCHARGE INSTRUCTIONS:  Instructions have been provided to him.   FOLLOW UP:  With Dr. Riley Kill on Tuesday May 1st, at 9 a.m.  He will need to  follow up with his primary care physician for his hypothyroidism.      Tereso Newcomer, P.A.    ______________________________  Pricilla Riffle, M.D.    SW/MEDQ  D:  09/21/2005  T:  09/22/2005  Job:  [id number]

## 2010-10-12 NOTE — H&P (Signed)
[facility]  Patient:    Jackson Shaw, Jackson Shaw Visit Number: [id number] MRN: [id number]          Service Type: MED Location: [phone] Attending Physician:  Shelba Flake Dictated by:   Arturo Morton Riley Kill, M.D. [facility] Admit Date:  06/09/2001                           History and Physical  There was no dictation for this report. Dictated by:   Arturo Morton Riley Kill, M.D. [facility] Attending Physician:  Shelba Flake DD:  06/09/01 TD:  06/09/01 Job: [id number]

## 2010-10-12 NOTE — Assessment & Plan Note (Signed)
[facility]                            CARDIOLOGY OFFICE NOTE   HASSANI, SLINEY                         MRN:          [id number]  DATE:09/22/2006                            DOB:          07/01/1955    Mr. Mcquerry is in for follow-up.  In general, he is stable; however,  apparently he has been having somewhat more chest pain.  The patient has  been catheterized almost annually and at the time of last  catheterization, he did have patent grafts.  He does have   Dictation ended at this point.     Arturo Morton. Riley Kill, MD, [facility]  Electronically Signed    TDS/MedQ  DD: 09/22/2006  DT: 09/23/2006  Job #: [id number]

## 2010-10-12 NOTE — H&P (Signed)
Jackson Shaw, Jackson Shaw                ACCOUNT NO.:  [id number]   MEDICAL RECORD NO.:  [id number]           PATIENT TYPE:   LOCATION:                                 FACILITY:   PHYSICIAN:  Arturo Morton. Riley Kill, M.D. [facility] DATE OF BIRTH:   DATE OF ADMISSION:  DATE OF DISCHARGE:                                HISTORY & PHYSICAL   CHIEF COMPLAINT:  Chest pain.   HISTORY OF PRESENT ILLNESS:  Jackson Shaw is a 55 year old gentleman who is  known to be HIV positive for nearly 17 years.  He also has type 1 diabetes  mellitus.  The patient has had a long history of heart disease. He underwent  revascularization surgery in January of 2003.  At that time, the patient had  a bypass graft x5 with an internal mammary to the LAD, sequential saphenous  vein graft  to the diagonal and OM, sequential saphenous vein graft  to the  posterior descending and posterolateral branch.  He has had some recurrent  chest pain and about a year ago underwent repeat cardiac catheterization.  At that time, there was competitive filling of the LAD and a patent mammary  to the LAD.  There was a 90% stenosis at the apical tip.  The saphenous vein  graft to the diagonal was intact and had a sequential to the OM-1.  This was  actually intact.  The left main had a 70 to 75% stenosis and supplied a  small distal circumflex.  The saphenous vein graft to the PDA was widely  patent but the posterolateral segment was occluded and importantly, the  distal right coronary circulation filled retrograde through the AV portion  of the vessel and retrograde from the vein graft into the PDA.  Recently,  the frequency of his chest pain has increased rather substantially, and he  comes in today for follow-up.  He has had mild elevation in creatinine in  the past and he is being brought in for hydration early on.   ALLERGIES:  PENICILLIN and CODEINE.   MEDICATIONS:  1.  Enteric coated aspirin 325 mg daily.  2.  Antiviral agents which the  patient will bring with him.  3.  Imdur 30 mg daily.  4.  Pravachol 20 mg nightly.  5.  Synthroid 100 mcg daily.  6.  Humalog t.i.d.  7.  Protonix 40 mg daily.  8.  Plavix 75 mg daily.  9.  Toprol XL 50 mg daily.   PAST MEDICAL HISTORY:  1.  History of human immunodeficiency virus x13 years.  2.  Type 1 diabetes mellitus.  3.  Hypothyroidism.  4.  History of C sarcoma.  5.  Ulcerative colitis.  6.  Anemia.  7.  History of Pneumocystis pneumonia.  8.  Hyperlipidemia.  9.  History of allergy to PENICILLIN causing anaphylaxis.  10. The patient has had prior orthopedic procedures on the feet.   SOCIAL HISTORY:  The patient denies alcohol or tobacco use.  He does not use  any illicit drugs.   FAMILY HISTORY:  The patient has an extensive family  history of coronary  heart disease.  His mother is living.  She has had a prior myocardial  infarction and bypass surgery.  His father died at age 53 of myocardial  infarction.  He has a sister who is 69 and has diabetes.   REVIEW OF SYSTEMS:  Remarkable for no GI symptoms.  He denies arthralgias.  He denies nausea and vomiting.   PHYSICAL EXAMINATION:  GENERAL APPEARANCE:  He is an alert and oriented  gentleman in no acute distress.  VITAL SIGNS:  The weight is 172 pounds, down 8 pounds from the last visit.  The blood pressure is 130/82, pulse 69.  NECK:  The jugular veins are not distended.  There are no carotid bruits.  LUNGS:  The lung fields are clear to auscultation and percussion.  CARDIOVASCULAR:  PMI is nondisplaced.  There is a normal first and second  heart sound.  ABDOMEN:  Soft with good bowel sounds.  There is no obvious  hepatosplenomegaly.  EXTREMITIES:  Pulses are intact.   The electrocardiogram demonstrates normal sinus rhythm within normal limits.   IMPRESSION:  1.  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  2.  History of human immunodeficiency virus positivity.  3.  Insulin-dependent  diabetes mellitus.  4.  Hypothyroidism.  5.  Other problems as listed.   PLAN:  The patient will undergo cardiac catheterization.  Risks, benefits,  and alternatives have been explained to him.  We will bring the patient in  early and hydrate him.      Arturo Morton. Riley Kill, M.D. [facility]  Electronically Signed     TDS/MEDQ  D:  09/19/2005  T:  09/19/2005  Job:  [id number]

## 2010-10-12 NOTE — Cardiovascular Report (Signed)
[place]  Patient:    Jackson Shaw, Jackson Shaw Visit Number: [id number] MRN: [id number]          Service Type: MED Location: [phone] Attending Physician:  Jonelle Sidle Dictated by:   Jonelle Sidle, M.D. [facility] Proc. Date: 06/11/01 Admit Date:  06/11/2001   CC:         Zandra Abts, M.D.   Cardiac Catheterization  DATE OF BIRTH:  [birth year].  REFERRING PHYSICIAN:  Zandra Abts, M.D.  CARDIOLOGIST:  Arturo Morton. Riley Kill, M.D.  INDICATIONS:  The patient is a 55 year old male with a significant family history of premature coronary artery disease, human immunodeficiency virus, and his known coronary artery disease status post previous myocardial infarction in October 2001 with subsequent stent placement to the mid to distal right coronary artery.  The patient presents with unstable angina and is referred for cardiac catheterization to define the coronary anatomy and left ventricular function.  PROCEDURES PERFORMED: 1. Left heart catheterization. 2. Left ventriculography. 3. Selective coronary angiography.  ACCESS AND EQUIPMENT:  The right femoral artery was anesthetized with 1% lidocaine and a #6 French sheath was placed via the modified Seldinger technique.  Standard preformed #6 [place] JL4, JR4, and angled pigtail catheters were used.  All exchanges were made over wires.  No complications were noted.  HEMODYNAMICS:  Left ventricle 137/7 mmHg.  Aorta 137/82 mmHg.  ANGIOGRAPHIC FINDINGS: 1. The left main coronary artery has an eccentric distal plaque of    approximately 60%. 2. The left anterior descending is a large caliber vessel that provides two    diagonal branches.  The proximal branch bifurcates and has an 80% proximal    stenosis.  There is a 40% mid LAD stenosis as well as an 80% apical lad    stenosis. 3. The ramus intermedius branch has a 30% proximal stenosis. 4. The circumflex system provides essentially two obtuse  marginal branches,    the first of which bifurcates.  Minor luminal irregularities are noted    throughout this system. 5. The right coronary artery is a dominant vessel that provides a posterior    descending branch.  There is a 40-50% proximal to mid vessel stenosis.    The previous stent site is patent with a minor degree of in-stent    restenosis; however, this area is followed by a 90% stenotic lesion in    the very distal right coronary artery.  There is also approximately 30-40%    stenosis in the posterolateral system.  Left ventriculography reveals an ejection fraction of 45-50% with global hypokinesis.  No significant mitral regurgitation is noted.  DIAGNOSES: 1. Left main and two-vessel obstructive coronary artery disease as outlined    above. 2. Left ventricular dysfunction with an ejection fraction of approximately    45-50%.  No significant mitral regurgitation is noted.  RECOMMENDATIONS:  Will ask CVTS to evaluate the patient for possible coronary artery bypass grafting. Dictated by:   Jonelle Sidle, M.D. [facility] Attending Physician:  Jonelle Sidle DD:  06/11/01 TD:  06/11/01 Job: [id number]

## 2010-10-15 ENCOUNTER — Emergency Department (HOSPITAL_COMMUNITY)
Admission: EM | Admit: 2010-10-15 | Discharge: 2010-10-16 | Disposition: A | Discharge status: Home / Self Care | Payer: No Typology Code available for payment source | Attending: Emergency Medicine | Admitting: Emergency Medicine

## 2010-10-15 DIAGNOSIS — X500XXA Overexertion from strenuous movement or load, initial encounter: Secondary | ICD-10-CM | POA: Insufficient documentation

## 2010-10-15 DIAGNOSIS — Z21 Asymptomatic human immunodeficiency virus [HIV] infection status: Secondary | ICD-10-CM | POA: Insufficient documentation

## 2010-10-15 DIAGNOSIS — I252 Old myocardial infarction: Secondary | ICD-10-CM | POA: Insufficient documentation

## 2010-10-15 DIAGNOSIS — E119 Type 2 diabetes mellitus without complications: Secondary | ICD-10-CM | POA: Insufficient documentation

## 2010-10-15 DIAGNOSIS — M25579 Pain in unspecified ankle and joints of unspecified foot: Secondary | ICD-10-CM | POA: Insufficient documentation

## 2010-10-15 DIAGNOSIS — S93409A Sprain of unspecified ligament of unspecified ankle, initial encounter: Secondary | ICD-10-CM | POA: Insufficient documentation

## 2010-10-16 ENCOUNTER — Emergency Department (HOSPITAL_COMMUNITY): Payer: No Typology Code available for payment source

## 2011-02-25 ENCOUNTER — Other Ambulatory Visit: Payer: Self-pay | Admitting: Cardiology

## 2011-02-25 LAB — POCT I-STAT, CHEM 8
BUN: 24 — ABNORMAL HIGH
Creatinine, Ser: 1.5
Glucose, Bld: 119 — ABNORMAL HIGH
Hemoglobin: 11.9 — ABNORMAL LOW
Potassium: 3.7
TCO2: 24

## 2011-03-08 LAB — COMPREHENSIVE METABOLIC PANEL
ALT: 15
Alkaline Phosphatase: 110
BUN: 19
CO2: 22
Chloride: 102
GFR calc non Af Amer: 40 — ABNORMAL LOW
Glucose, Bld: 232 — ABNORMAL HIGH
Potassium: 3.9
Sodium: 134 — ABNORMAL LOW
Total Bilirubin: 2.6 — ABNORMAL HIGH

## 2011-03-08 LAB — CBC
HCT: 39.2
Hemoglobin: 13.7
RBC: 4.1 — ABNORMAL LOW
RDW: 12.8

## 2011-03-08 LAB — DIFFERENTIAL
Basophils Absolute: 0
Basophils Relative: 0
Eosinophils Absolute: 0.1
Monocytes Relative: 7
Neutro Abs: 3.5
Neutrophils Relative %: 59

## 2011-03-08 LAB — URINALYSIS, ROUTINE W REFLEX MICROSCOPIC
Glucose, UA: >1000 — AB
Leukocytes, UA: NEGATIVE
Protein, ur: 30 — AB
pH: 7.5

## 2011-03-08 LAB — URINE MICROSCOPIC-ADD ON

## 2011-03-29 ENCOUNTER — Other Ambulatory Visit: Payer: Self-pay | Admitting: Cardiology

## 2011-05-01 ENCOUNTER — Other Ambulatory Visit: Payer: Self-pay | Admitting: Cardiology

## 2011-05-02 ENCOUNTER — Telehealth: Payer: Self-pay | Admitting: Cardiology

## 2011-05-02 NOTE — Telephone Encounter (Signed)
Left message for pt to call back.  Jackson Shaw had left a message for this pt about his Isosorbide refill.  We need to clarify his dose before refilling medication.

## 2011-05-02 NOTE — Telephone Encounter (Signed)
New message:  Pt was called regarding his medication and dose but he would like to speak to you. Please call him at [phone]

## 2011-05-02 NOTE — Telephone Encounter (Signed)
Left patient to call back to clarify medication dose

## 2011-05-06 MED ORDER — ISOSORBIDE MONONITRATE ER 60 MG PO TB24: ORAL_TABLET | ORAL | Status: DC

## 2011-05-06 NOTE — Telephone Encounter (Addendum)
I spoke with the pt and he recently had a stroke and has been in the hospital at [facility].  The pt said he would have his records sent to our office.  The pt is taking Isosorbide MN 60mg  take one and one-half tablet daily.  New Rx sent to pharmacy.

## 2011-05-06 NOTE — Telephone Encounter (Signed)
Left message for pt to call back to clarify Isosorbide MN dosage.

## 2011-05-06 NOTE — Telephone Encounter (Signed)
Addended by: Iona Coach on: 05/06/2011 02:31 PM   Modules accepted: Orders

## 2011-07-03 ENCOUNTER — Ambulatory Visit: Payer: No Typology Code available for payment source | Admitting: Cardiology

## 2011-08-01 ENCOUNTER — Ambulatory Visit: Payer: No Typology Code available for payment source | Admitting: Cardiology

## 2012-06-08 ENCOUNTER — Telehealth: Payer: Self-pay | Admitting: Cardiology

## 2012-06-08 NOTE — Telephone Encounter (Signed)
Pt has moved to [place] and missed appt with dr Riley Kill, having some problems, can dr Riley Kill refer him to cardiologist there

## 2012-06-08 NOTE — Telephone Encounter (Signed)
Left message to call back  

## 2012-06-12 NOTE — Telephone Encounter (Signed)
**Note De-Identified Jackson Shaw Obfuscation** Pt. states that he is more distressed than he thought he would be over Dr. Rosalyn Charters retirement and wants Dr. Riley Kill to refer him to another cardiologist. He wants [place] to schedule an appt. with and send his cardiac records to the cardiologist that Dr. Riley Kill recommends and to contact him with details. Will forward note to Dr. Riley Kill and his nurse, Leotis Shames.

## 2012-06-12 NOTE — Telephone Encounter (Signed)
F/U   Referral to cardiology in [place] . C/O chest pain.

## 2012-06-16 NOTE — Telephone Encounter (Signed)
Per Dr Riley Kill he does not know any cardiologist in [place]. He recommends that the pt seek evaluation in ER for CP. I have left this information on the pt's voicemail.

## 2012-06-16 NOTE — Telephone Encounter (Signed)
New problem:   Need to speak with nurse. Aware of conversation he had with Larita Fife.  Status of referral.

## 2012-06-19 ENCOUNTER — Telehealth: Payer: Self-pay | Admitting: Cardiology

## 2012-06-19 NOTE — Telephone Encounter (Signed)
New problem     Patient calling wants to be Referral [facility] in [place].

## 2012-06-19 NOTE — Telephone Encounter (Signed)
[facility] [address] [place], [place] [address] Phone: [phone] Fax: [phone]  I spoke with the pt and he would like to be referred to a Cardiologist in [place]. The pt is having problems with walking up the steps in his home.  The pt goes up 21 steps and feels like he "ran a marathon". The pt develops SOB and CP takes an ASA and his pain subsides. The pt was going to go to the ER earlier in the week but did not go.  The pt needs our office to call [facility] and refer him to that practice.  The pt states he live 3 minutes from the office and can come for an appointment anytime. I will have the triage nurse refer the pt on Monday and then have medical records send his records.

## 2012-06-22 NOTE — Telephone Encounter (Signed)
Called and left a message to Jackson Shaw at [employer], to call back. ext [phone].

## 2012-06-22 NOTE — Telephone Encounter (Signed)
Spoke with Kandice Robinsons at [employer]. She will fax back a form to filled out, and we need to fax it  back with the last office visit and any EKG  And echo done in this office. I was not able to find an office visit with Dr. Riley Kill. Last EKG was done on Sept 29/2011. Pt said he had an office visit here with Dr Riley Kill six months ago.

## 2012-06-22 NOTE — Telephone Encounter (Signed)
Received form from [facility]. Form filled and faxed back with 2011 EKG, 02/22/2010 office visit and cardiac catheterization done on 02/14/2010.

## 2012-06-22 NOTE — Telephone Encounter (Signed)
Left a message to  [facility] for referral.

## 2012-06-29 NOTE — Telephone Encounter (Signed)
I spoke with Jackson Shaw and the pt is scheduled to be seen 07/02/12.

## 2012-06-29 NOTE — Telephone Encounter (Signed)
I left a message for Jackson Shaw to call our office with the status of the pt's referral.

## 2014-05-31 ENCOUNTER — Telehealth: Payer: Self-pay | Admitting: Orthopedic Surgery

## 2014-05-31 NOTE — Telephone Encounter (Signed)
Pt was last seen in our office 02/22/10 by Dr Lia Foyer.  When I last spoke with the pt he had moved to [place] and I had referred him to [facility] in 2014.   Left message on machine for pt to contact the office.

## 2014-05-31 NOTE — Telephone Encounter (Signed)
New Message        Pt calling asking for Lauren RN to give him a call back. Pt states he is a previous pt of Dr. Hyman Hopes.

## 2014-06-09 NOTE — Telephone Encounter (Signed)
The pt has not called back. I will close this encounter.

## 2014-08-08 ENCOUNTER — Encounter: Payer: Self-pay | Admitting: Internal Medicine

## 2014-08-08 ENCOUNTER — Ambulatory Visit (INDEPENDENT_AMBULATORY_CARE_PROVIDER_SITE_OTHER): Payer: Self-pay | Admitting: Internal Medicine

## 2014-08-08 VITALS — BP 142/98 | HR 91 | Ht 69.0 in | Wt 171.1 lb

## 2014-08-08 DIAGNOSIS — N182 Chronic kidney disease, stage 2 (mild): Secondary | ICD-10-CM

## 2014-08-08 DIAGNOSIS — R55 Syncope and collapse: Secondary | ICD-10-CM

## 2014-08-08 DIAGNOSIS — Z951 Presence of aortocoronary bypass graft: Secondary | ICD-10-CM

## 2014-08-08 DIAGNOSIS — E7800 Pure hypercholesterolemia, unspecified: Secondary | ICD-10-CM

## 2014-08-08 DIAGNOSIS — K219 Gastro-esophageal reflux disease without esophagitis: Secondary | ICD-10-CM

## 2014-08-08 DIAGNOSIS — R0789 Other chest pain: Secondary | ICD-10-CM

## 2014-08-08 DIAGNOSIS — Z95818 Presence of other cardiac implants and grafts: Secondary | ICD-10-CM

## 2014-08-08 DIAGNOSIS — I25708 Atherosclerosis of coronary artery bypass graft(s), unspecified, with other forms of angina pectoris: Secondary | ICD-10-CM

## 2014-08-08 DIAGNOSIS — B20 Human immunodeficiency virus [HIV] disease: Secondary | ICD-10-CM

## 2014-08-08 DIAGNOSIS — E1143 Type 2 diabetes mellitus with diabetic autonomic (poly)neuropathy: Secondary | ICD-10-CM

## 2014-08-08 DIAGNOSIS — R079 Chest pain, unspecified: Secondary | ICD-10-CM | POA: Insufficient documentation

## 2014-08-08 MED ORDER — METOPROLOL SUCCINATE ER 25 MG PO TB24: 25.0000 mg | ORAL_TABLET | Freq: Every day | ORAL | Status: DC

## 2014-08-08 NOTE — Patient Instructions (Addendum)
Your physician recommends that you schedule a follow-up appointment in: [place] has recommended you make the following change in your medication: STOP Isosorbide and START Toprol XL 25 mg daily  Please Schedule patient for Device check with Dr Sallyanne Kuster or [facility]

## 2014-08-08 NOTE — Progress Notes (Signed)
OFFICE NOTE  Chief Complaint:  Chest pain, recent syncope, establish new cardiologist  Primary Care Physician: Dollene Cleveland, MD  HPI:  Jackson Shaw is a complex 59 year old male who is a former patient of Dr. Addison Lank. 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 He's been on a number of antiviral regimens and fortunately has survived secondary to this, however has suffered a number of consequences from his long-term disease and medical therapy. Other comorbidities include significant coronary artery disease and he status post 5 vessel CABG in 2012 by Dr. Modesto Charon (Coronary artery bypass grafting x 5 (left internal mammary artery to left anterior descending, sequential saphenous vein graft to diagonal 1 and obtuse marginal 1, sequential saphenous vein graft to posterior descending and Posterolateral). This is following non-ST elevation MI, the fourth that he has had. He also reports having had 2 prior strokes. In addition he is an insulin-dependent diabetes with abnormal cholesterol and hypertension. He's not currently on any antihypertensive or heart failure/coronary artery disease medications due to syncope. He reports last summer he was having chest pain and had a syncopal event. He underwent heart catheterization which he reports was performed at [facility] by cardiologist with [facility]. The results are as follows:  Left heart catheterization (01/20/14)  revealed:   Left Ventriculography: 35% with anterior dk and inferior hk  Coronary Angiography: Left Main: 100 LAD: 100 prox. LCX: 99 prox RCA: 100 prox SVG to pda has a 50% prox lesion. It ties into a normal pda and backfillsa a small pl system through a severe lesion. This is unchanged from prior cath SVG to ramus: There are patent prox and mid stents. This graft had been a jump graft to diag and ramus but the diag is subtotally occluded at the  anastamosis (unchanged from prior). The ramus is large LIMA to lad ties into mid lad. There is a 50 in the lad after the touchdown  Impression: Severe 3 v cad Moderately decreased lv ef 3/4 grafts patent with widely patent stents in the svg to ramus No change from prior cath  He subsequently had placement of a [employer] Linq loop recorder. No clear etiology of his syncope has been found. In addition he continued to have chest pain symptoms and had increasing doses of Imdur were prescribed. He is currently on 60 mg twice daily. He reports that this is not clearly helped his symptoms. He is not currently on any heart failure medications, although his EF was 35%.  PMHx:  Past Medical History  Diagnosis Date  . Diabetes mellitus without complication   . Coronary artery disease   . CHF (congestive heart failure)   . Hyperlipidemia   . Hypertension   . Stroke   . Syncope and collapse   . HIV (human immunodeficiency virus infection)   . Cancer     Past Surgical History  Procedure Laterality Date  . Loop recorder implant      FAMHx:  Family History  Problem Relation Age of Onset  . Heart disease Mother   . Diabetes Mother   . Cancer Mother     SOCHx:   reports that he has never smoked. He has never used smokeless tobacco. He reports that he drinks alcohol. His drug history is not on file.  ALLERGIES:  Allergies  Allergen Reactions  . Codeine   . Penicillins     ROS: A comprehensive review of systems was negative  except for: Cardiovascular: positive for chest pain Neurological: positive for Syncope  HOME MEDS: Current Outpatient Prescriptions  Medication Sig Dispense Refill  . aspirin EC 81 MG tablet Take 1 tablet by mouth daily.    Marland Kitchen atorvastatin (LIPITOR) 20 MG tablet Take 1 tablet by mouth daily.    . citalopram (CELEXA) 10 MG tablet Take 1 tablet by mouth daily.    . clopidogrel (PLAVIX) 75 MG tablet Take 1 tablet by mouth daily.    . Darunavir Ethanolate  (PREZISTA) 800 MG tablet Take 1 tablet by mouth daily.    . dolutegravir (TIVICAY) 50 MG tablet Take 1 tablet by mouth daily.    Marland Kitchen emtricitabine-tenofovir (TRUVADA) 200-300 MG per tablet Take 1 tablet by mouth daily.    . insulin aspart (NOVOLOG) 100 UNIT/ML injection Inject into the skin 3 (three) times daily before meals.    Marland Kitchen levothyroxine (SYNTHROID, LEVOTHROID) 75 MCG tablet Take 225 mcg by mouth daily.    . metFORMIN (GLUCOPHAGE-XR) 500 MG 24 hr tablet Take 1 tablet by mouth 2 (two) times daily.    . pantoprazole (PROTONIX) 40 MG tablet Take 1 tablet by mouth daily.    . ritonavir (NORVIR) 100 MG capsule Take 1 capsule by mouth daily.    . metoprolol succinate (TOPROL XL) 25 MG 24 hr tablet Take 1 tablet (25 mg total) by mouth daily. 30 tablet 6   No current facility-administered medications for this visit.    LABS/IMAGING: No results found for this or any previous visit (from the past 48 hour(s)). No results found.  VITALS: BP 142/98 mmHg  Pulse 91  Ht 5\' 9"  (1.753 m)  Wt 171 lb 1.6 oz (77.61 kg)  BMI 25.26 kg/m2  EXAM: General appearance: alert and no distress Neck: no carotid bruit and no JVD Lungs: clear to auscultation bilaterally Heart: regular rate and rhythm, S1, S2 normal and systolic murmur: early systolic 2/6, crescendo at lower left sternal border Abdomen: soft, non-tender; bowel sounds normal; no masses,  no organomegaly Extremities: extremities normal, atraumatic, no cyanosis or edema Pulses: 2+ and symmetric Skin: Skin color, texture, turgor normal. No rashes or lesions Neurologic: Grossly normal Psych: Pleasant, normal mood, affect  EKG: Normal sinus rhythm at 91, left anterior fascicular block, prolonged QTC at 494 ms  ASSESSMENT: 1. Coronary artery disease status post 5 vessel bypass in 2012 (1 skipped grafts-3 out of 4 grafts patent by catheterization in August 2015) 2. Ischemic cardiopathy, EF 35%-NYHA class I-II symptoms 3. Long-standing HIV  disease since the 1980s 4. Insulin-dependent diabetes 5. Hypertension 6. Dyslipidemia 7. Peripheral and possibly autonomic neuropathy 8. Recurrent syncope-status post loop recorder implant  PLAN: 1.   Mr. Wilkie has numerous medical problems and his lived with HIV disease since the 1980s, therefore a number of these problems are to be expected. Particularly the development of neuropathy. I suspect he has some degree of autonomic neuropathy and that is playing a role in his syncopal events. No clear etiologies been found with loop recorder implant. In addition, he's had escalating doses of isosorbide for chest pain. There does not seem to be any improvement in his chest pain despite the fact she is on 120 mg of indoor on a daily basis. I think this is leading to significant venous dilatation and causing him to pass out. I'm recommending that he discontinue the isosorbide. I'm not sure his pain is clearly anginal although may reflect small vessel disease. Ultimately we could consider a medication like Ranexa. From a  heart failure standpoint, he is not currently on any heart failure medications, which may be due to the fact that he had syncope. Recently he's been having high blood pressure and I would like to start him back on a beta blocker. This should help diastolic filling and reduce LVEDP which could be contributing to some degree of myocardial angina. We will go ahead and set him up in our device clinic and further interrogate his loop recorder. I understand the concern for arrhythmia given the fact that his EF is low. It would be worthwhile considering a repeat echocardiogram when I see him back to further assess his EF. Once we reestablish him on heart failure medicines, if there is no improvement in EF, he may be a candidate for loop recorder explant an AICD implant.  Plan to see him back in a month and I'll see how he is feeling with these medication changes.  I spent 60 minutes reviewing his  extensive medical records including his past medical history at [facility] and [facility] through care everywhere as well as rendering direct care and obtaining an extensive personal history from the patient.  Pixie Casino, MD, [facility] Attending Cardiologist [facility]  Capri Raben C 08/08/2014, 5:39 PM

## 2014-08-09 ENCOUNTER — Telehealth (HOSPITAL_COMMUNITY): Payer: Self-pay | Admitting: Internal Medicine

## 2014-08-09 NOTE — Telephone Encounter (Signed)
Faxed Release signed by patient to [facility] to obtain records per Dr Debara Pickett.  lp

## 2014-08-26 ENCOUNTER — Telehealth: Payer: Self-pay | Admitting: Internal Medicine

## 2014-08-26 NOTE — Telephone Encounter (Signed)
Received records from [facility] for appointment with Dr Debara Pickett on 09/19/14.  Records given to [facility] (medical records) for Dr Lysbeth Penner schedule on 09/19/14. lp

## 2014-09-19 ENCOUNTER — Encounter (HOSPITAL_COMMUNITY): Payer: Self-pay | Admitting: Emergency Medicine

## 2014-09-19 ENCOUNTER — Emergency Department (HOSPITAL_COMMUNITY): Payer: Medicaid Other

## 2014-09-19 ENCOUNTER — Ambulatory Visit: Payer: Self-pay | Admitting: Internal Medicine

## 2014-09-19 ENCOUNTER — Emergency Department (HOSPITAL_COMMUNITY)
Admission: EM | Admit: 2014-09-19 | Discharge: 2014-09-19 | Disposition: A | Discharge status: Home / Self Care | Payer: Medicaid Other | Attending: Emergency Medicine | Admitting: Emergency Medicine

## 2014-09-19 DIAGNOSIS — E119 Type 2 diabetes mellitus without complications: Secondary | ICD-10-CM | POA: Diagnosis not present

## 2014-09-19 DIAGNOSIS — Z85828 Personal history of other malignant neoplasm of skin: Secondary | ICD-10-CM | POA: Insufficient documentation

## 2014-09-19 DIAGNOSIS — I10 Essential (primary) hypertension: Secondary | ICD-10-CM | POA: Insufficient documentation

## 2014-09-19 DIAGNOSIS — Z21 Asymptomatic human immunodeficiency virus [HIV] infection status: Secondary | ICD-10-CM | POA: Diagnosis not present

## 2014-09-19 DIAGNOSIS — I509 Heart failure, unspecified: Secondary | ICD-10-CM | POA: Insufficient documentation

## 2014-09-19 DIAGNOSIS — I2510 Atherosclerotic heart disease of native coronary artery without angina pectoris: Secondary | ICD-10-CM | POA: Diagnosis not present

## 2014-09-19 DIAGNOSIS — R4781 Slurred speech: Secondary | ICD-10-CM

## 2014-09-19 DIAGNOSIS — Z880 Allergy status to penicillin: Secondary | ICD-10-CM | POA: Insufficient documentation

## 2014-09-19 DIAGNOSIS — Z7902 Long term (current) use of antithrombotics/antiplatelets: Secondary | ICD-10-CM | POA: Diagnosis not present

## 2014-09-19 DIAGNOSIS — R4182 Altered mental status, unspecified: Secondary | ICD-10-CM | POA: Diagnosis not present

## 2014-09-19 DIAGNOSIS — E785 Hyperlipidemia, unspecified: Secondary | ICD-10-CM | POA: Insufficient documentation

## 2014-09-19 DIAGNOSIS — Z79899 Other long term (current) drug therapy: Secondary | ICD-10-CM | POA: Insufficient documentation

## 2014-09-19 DIAGNOSIS — Z7982 Long term (current) use of aspirin: Secondary | ICD-10-CM | POA: Insufficient documentation

## 2014-09-19 DIAGNOSIS — R55 Syncope and collapse: Secondary | ICD-10-CM | POA: Diagnosis present

## 2014-09-19 DIAGNOSIS — Z794 Long term (current) use of insulin: Secondary | ICD-10-CM | POA: Insufficient documentation

## 2014-09-19 DIAGNOSIS — Z8673 Personal history of transient ischemic attack (TIA), and cerebral infarction without residual deficits: Secondary | ICD-10-CM | POA: Diagnosis not present

## 2014-09-19 LAB — COMPREHENSIVE METABOLIC PANEL
ALT: 11 U/L (ref 0–53)
AST: 22 U/L (ref 0–37)
Albumin: 3.3 g/dL — ABNORMAL LOW (ref 3.5–5.2)
Alkaline Phosphatase: 69 U/L (ref 39–117)
Anion gap: 10 (ref 5–15)
BUN: 20 mg/dL (ref 6–23)
CHLORIDE: 96 mmol/L (ref 96–112)
CO2: 22 mmol/L (ref 19–32)
Calcium: 9 mg/dL (ref 8.4–10.5)
Creatinine, Ser: 1.41 mg/dL — ABNORMAL HIGH (ref 0.50–1.35)
GFR calc Af Amer: 62 mL/min — ABNORMAL LOW (ref >90)
GFR, EST NON AFRICAN AMERICAN: 54 mL/min — AB (ref >90)
GLUCOSE: 107 mg/dL — AB (ref 70–99)
Potassium: 3.6 mmol/L (ref 3.5–5.1)
Sodium: 128 mmol/L — ABNORMAL LOW (ref 135–145)
Total Bilirubin: 0.5 mg/dL (ref 0.3–1.2)
Total Protein: 7.5 g/dL (ref 6.0–8.3)

## 2014-09-19 LAB — I-STAT TROPONIN, ED: Troponin i, poc: 0 ng/mL (ref 0.00–0.08)

## 2014-09-19 LAB — I-STAT CHEM 8, ED
BUN: 24 mg/dL — AB (ref 6–23)
CALCIUM ION: 1.23 mmol/L (ref 1.12–1.23)
CHLORIDE: 98 mmol/L (ref 96–112)
Creatinine, Ser: 1.2 mg/dL (ref 0.50–1.35)
Glucose, Bld: 110 mg/dL — ABNORMAL HIGH (ref 70–99)
HCT: 39 % (ref 39.0–52.0)
Hemoglobin: 13.3 g/dL (ref 13.0–17.0)
POTASSIUM: 3.6 mmol/L (ref 3.5–5.1)
Sodium: 133 mmol/L — ABNORMAL LOW (ref 135–145)
TCO2: 22 mmol/L (ref 0–100)

## 2014-09-19 LAB — URINALYSIS, ROUTINE W REFLEX MICROSCOPIC
BILIRUBIN URINE: NEGATIVE
Glucose, UA: NEGATIVE mg/dL
HGB URINE DIPSTICK: NEGATIVE
KETONES UR: NEGATIVE mg/dL
Leukocytes, UA: NEGATIVE
NITRITE: NEGATIVE
PROTEIN: 30 mg/dL — AB
Specific Gravity, Urine: 1.01 (ref 1.005–1.030)
UROBILINOGEN UA: 0.2 mg/dL (ref 0.0–1.0)
pH: 6.5 (ref 5.0–8.0)

## 2014-09-19 LAB — CBC WITH DIFFERENTIAL/PLATELET
Basophils Absolute: 0 10*3/uL (ref 0.0–0.1)
Basophils Relative: 0 % (ref 0–1)
EOS ABS: 0.1 10*3/uL (ref 0.0–0.7)
Eosinophils Relative: 3 % (ref 0–5)
HEMATOCRIT: 35.5 % — AB (ref 39.0–52.0)
Hemoglobin: 12.3 g/dL — ABNORMAL LOW (ref 13.0–17.0)
LYMPHS ABS: 1.6 10*3/uL (ref 0.7–4.0)
LYMPHS PCT: 31 % (ref 12–46)
MCH: 29.1 pg (ref 26.0–34.0)
MCHC: 34.6 g/dL (ref 30.0–36.0)
MCV: 84.1 fL (ref 78.0–100.0)
Monocytes Absolute: 0.5 10*3/uL (ref 0.1–1.0)
Monocytes Relative: 9 % (ref 3–12)
NEUTROS ABS: 3 10*3/uL (ref 1.7–7.7)
Neutrophils Relative %: 57 % (ref 43–77)
PLATELETS: 194 10*3/uL (ref 150–400)
RBC: 4.22 MIL/uL (ref 4.22–5.81)
RDW: 12.2 % (ref 11.5–15.5)
WBC: 5.3 10*3/uL (ref 4.0–10.5)

## 2014-09-19 LAB — RAPID URINE DRUG SCREEN, HOSP PERFORMED
AMPHETAMINES: NOT DETECTED
Barbiturates: NOT DETECTED
Benzodiazepines: NOT DETECTED
Cocaine: NOT DETECTED
OPIATES: NOT DETECTED
TETRAHYDROCANNABINOL: NOT DETECTED

## 2014-09-19 LAB — PROTIME-INR
INR: 1.01 (ref 0.00–1.49)
PROTHROMBIN TIME: 13.4 s (ref 11.6–15.2)

## 2014-09-19 LAB — APTT: APTT: 28 s (ref 24–37)

## 2014-09-19 LAB — ETHANOL: Alcohol, Ethyl (B): <5 mg/dL (ref 0–9)

## 2014-09-19 LAB — CBG MONITORING, ED: Glucose-Capillary: 113 mg/dL — ABNORMAL HIGH (ref 70–99)

## 2014-09-19 LAB — URINE MICROSCOPIC-ADD ON

## 2014-09-19 MED ORDER — SODIUM CHLORIDE 0.9 % IV BOLUS (SEPSIS)
2000.0000 mL | Freq: Once | INTRAVENOUS | Status: AC
  Administered 2014-09-19: 2000 mL via INTRAVENOUS

## 2014-09-19 NOTE — ED Notes (Signed)
Pt discharged home. Vital signs stable. Has no further questions. Pt encouraged to follow up with PCP.

## 2014-09-19 NOTE — Consult Note (Addendum)
Referring Physician: Oni    Chief Complaint: Syncope, difficulty with speech  HPI: Jackson Shaw is an 59 y.o. HIV positive  male who picked up his partner yesterday and reported that he was feeling poorly.  His speech was normal at that time.  When he got home he went to the bathroom.  While on the toilet he passed out.  When found by his partner he was pale.  EMS was called and when they arrived the patient was hypotensive and bradycardic.  Speech was unintelligible and patient was brought in as a code stroke.  Initial NIHSS of 3.   Patient has had strokes in the past.  He is currently on ASA and Plavix. Patient has had syncopal episodes in the past.  Date last known well: Date: 09/18/2014 Time last known well: Time: 23:20 tPA Given: No: improvement in symptoms  Past Medical History  Diagnosis Date  . Diabetes mellitus without complication   . Coronary artery disease   . CHF (congestive heart failure)   . Hyperlipidemia   . Hypertension   . Stroke   . Syncope and collapse   . HIV (human immunodeficiency virus infection)   . Cancer     Past Surgical History  Procedure Laterality Date  . Loop recorder implant      Family History  Problem Relation Age of Onset  . Heart disease Mother   . Diabetes Mother   . Cancer Mother    Social History:  reports that he has never smoked. He has never used smokeless tobacco. He reports that he drinks alcohol. His drug history is not on file.  Allergies:  Allergies  Allergen Reactions  . Penicillins Anaphylaxis  . Codeine Nausea Only    Medications: I have reviewed the patient's current medications. Prior to Admission:  Current outpatient prescriptions:  .  aspirin EC 81 MG tablet, Take 1 tablet by mouth daily., Disp: , Rfl:  .  atorvastatin (LIPITOR) 20 MG tablet, Take 1 tablet by mouth daily., Disp: , Rfl:  .  citalopram (CELEXA) 10 MG tablet, Take 1 tablet by mouth daily., Disp: , Rfl:  .  clopidogrel (PLAVIX) 75 MG tablet, Take 1  tablet by mouth daily., Disp: , Rfl:  .  Darunavir Ethanolate (PREZISTA) 800 MG tablet, Take 1 tablet by mouth daily., Disp: , Rfl:  .  dolutegravir (TIVICAY) 50 MG tablet, Take 1 tablet by mouth daily., Disp: , Rfl:  .  emtricitabine-tenofovir (TRUVADA) 200-300 MG per tablet, Take 1 tablet by mouth daily., Disp: , Rfl:  .  insulin aspart (NOVOLOG) 100 UNIT/ML injection, Inject 6 Units into the skin 3 (three) times daily with meals. 6 units is the base. Add 2 units for each 15gm of carbs, Disp: , Rfl:  .  levothyroxine (SYNTHROID, LEVOTHROID) 75 MCG tablet, Take 225 mcg by mouth daily., Disp: , Rfl:  .  metFORMIN (GLUCOPHAGE-XR) 500 MG 24 hr tablet, Take 1 tablet by mouth 2 (two) times daily., Disp: , Rfl:  .  metoprolol succinate (TOPROL XL) 25 MG 24 hr tablet, Take 1 tablet (25 mg total) by mouth daily., Disp: 30 tablet, Rfl: 6 .  pantoprazole (PROTONIX) 40 MG tablet, Take 1 tablet by mouth daily., Disp: , Rfl:  .  ritonavir (NORVIR) 100 MG capsule, Take 1 capsule by mouth daily., Disp: , Rfl:   ROS: History obtained from the patient  General ROS: negative for - chills, fatigue, fever, night sweats, weight gain or weight loss Psychological ROS: negative for -  behavioral disorder, hallucinations, memory difficulties, mood swings or suicidal ideation Ophthalmic ROS: negative for - blurry vision, double vision, eye pain or loss of vision ENT ROS: negative for - epistaxis, nasal discharge, oral lesions, sore throat, tinnitus or vertigo Allergy and Immunology ROS: negative for - hives or itchy/watery eyes Hematological and Lymphatic ROS: negative for - bleeding problems, bruising or swollen lymph nodes Endocrine ROS: negative for - galactorrhea, hair pattern changes, polydipsia/polyuria or temperature intolerance Respiratory ROS: negative for - cough, hemoptysis, shortness of breath or wheezing Cardiovascular ROS: negative for - chest pain, dyspnea on exertion, edema or irregular  heartbeat Gastrointestinal ROS: abdominal pain Genito-Urinary ROS: negative for - dysuria, hematuria, incontinence or urinary frequency/urgency Musculoskeletal ROS: BLE pain Neurological ROS: as noted in HPI Dermatological ROS: negative for rash and skin lesion changes  Physical Examination: Blood pressure 124/73, pulse 68, temperature 97.7 F (36.5 C), resp. rate 15, height 5\' 9"  (1.753 m), weight 78.501 kg (173 lb 1 oz), SpO2 100 %.  HEENT-  Normocephalic, no lesions, without obvious abnormality.  Normal external eye and conjunctiva.  Normal TM's bilaterally.  Normal auditory canals and external ears. Normal external nose, mucus membranes and septum.  Normal pharynx. Cardiovascular- S1, S2 normal, pulses palpable throughout   Lungs- chest clear, no wheezing, rales, normal symmetric air entry Abdomen- soft, non-tender; bowel sounds normal; no masses,  no organomegaly Extremities- no edema Lymph-no adenopathy palpable Musculoskeletal-no joint tenderness, deformity or swelling Skin-warm and dry, no hyperpigmentation, vitiligo, or suspicious lesions  Neurological Examination Mental Status: Alert, oriented other than initially reporting that the month is may, thought content appropriate.  Speech fluent without evidence of aphasia but some occasional word finding.  Able to follow 3 step commands without difficulty. Cranial Nerves: II: Discs flat bilaterally; Visual fields grossly normal, pupils equal, round, reactive to light and accommodation III,IV, VI: ptosis not present, extra-ocular motions intact bilaterally V,VII: smile symmetric, facial light touch sensation normal bilaterally VIII: hearing normal bilaterally IX,X: gag reflex present XI: bilateral shoulder shrug XII: midline tongue extension Motor: Right : Upper extremity   5/5    Left:     Upper extremity   5/5  Lower extremity   5/5     Lower extremity   5/5 Tone and bulk:normal tone throughout; no atrophy noted Sensory:  Pinprick and light touch intact throughout, bilaterally Deep Tendon Reflexes: 2+ in the upper extremities an absent in the lower extremities Plantars: Right: upgoing   Left: downgoing Cerebellar: normal finger-to-nose and normal heel-to-shin testing bilaterally Gait: not tested due to hypotension   Laboratory Studies:  Basic Metabolic Panel:  Recent Labs Lab 09/19/14 0046 09/19/14 0100  NA 128* 133*  K 3.6 3.6  CL 96 98  CO2 22  --   GLUCOSE 107* 110*  BUN 20 24*  CREATININE 1.41* 1.20  CALCIUM 9.0  --     Liver Function Tests:  Recent Labs Lab 09/19/14 0046  AST 22  ALT 11  ALKPHOS 69  BILITOT 0.5  PROT 7.5  ALBUMIN 3.3*   No results for input(s): LIPASE, AMYLASE in the last 168 hours. No results for input(s): AMMONIA in the last 168 hours.  CBC:  Recent Labs Lab 09/19/14 0046 09/19/14 0100  WBC 5.3  --   NEUTROABS 3.0  --   HGB 12.3* 13.3  HCT 35.5* 39.0  MCV 84.1  --   PLT 194  --     Cardiac Enzymes: No results for input(s): CKTOTAL, CKMB, CKMBINDEX, TROPONINI in the last  168 hours.  BNP: Invalid input(s): POCBNP  CBG:  Recent Labs Lab 09/19/14 0055  GLUCAP 113*    Microbiology: No results found for this or any previous visit.  Coagulation Studies:  Recent Labs  09/19/14 0046  LABPROT 13.4  INR 1.01    Urinalysis: No results for input(s): COLORURINE, LABSPEC, PHURINE, GLUCOSEU, HGBUR, BILIRUBINUR, KETONESUR, PROTEINUR, UROBILINOGEN, NITRITE, LEUKOCYTESUR in the last 168 hours.  Invalid input(s): APPERANCEUR  Lipid Panel:    Component Value Date/Time   CHOL 234* 03/05/2010 1200   TRIG 93.0 03/05/2010 1200   HDL 37.60* 03/05/2010 1200   CHOLHDL 6 03/05/2010 1200   VLDL 18.6 03/05/2010 1200    HgbA1C: No results found for: HGBA1C  Urine Drug Screen:  No results found for: LABOPIA, COCAINSCRNUR, LABBENZ, AMPHETMU, THCU, LABBARB  Alcohol Level:  Recent Labs Lab 09/19/14 0046  ETH <5    Other results: EKG: sinus  rhythm at 65 bpm.  Left axis deviation.  Imaging: Ct Head Wo Contrast  09/19/2014   CLINICAL DATA:  Code stroke: New onset dysphagia. Medical history includes HIV.  EXAM: CT HEAD WITHOUT CONTRAST  TECHNIQUE: Contiguous axial images were obtained from the base of the skull through the vertex without intravenous contrast.  COMPARISON:  None.  FINDINGS: Negative for acute intracranial hemorrhage, acute infarction, mass, mass effect, hydrocephalus or midline shift. Gray-white differentiation is preserved throughout. Age advanced periventricular, subcortical and deep white matter hypoattenuation. While nonspecific, findings are most suggestive of chronic microvascular ischemic white matter disease. No focal soft tissue or calvarial abnormality. Globes and orbits are symmetric and intact bilaterally. Normal aeration of the mastoid air cells and visualized paranasal sinuses. Atherosclerotic calcification throughout both cavernous carotid arteries.  IMPRESSION: 1. No acute intracranial abnormality. 2. Age advanced white matter hypoattenuation most consistent with chronic microvascular ischemic white matter disease. 3. Intracranial atherosclerosis.   Electronically Signed   By: Jacqulynn Cadet M.D.   On: 09/19/2014 00:59    Assessment: 59 y.o. male presenting after a syncopal episode with hypotensions, bradycardia and slurred speech.  Symptoms have improved dramatically in the ED.  Head CT personally reviewed and shows no acute changes.  Symptoms likely related to hypotension but will rule out the possibility of an acute infarct.    Stroke Risk Factors - diabetes mellitus, hyperlipidemia and hypertension  Plan: 1. HgbA1c, fasting lipid panel 2. MRI of the brain without contrast.  If MRI of the brain unremarkable would not proceed with a stroke work up.   3. Prophylactic therapy-Continue ASA and Plavix 4. NPO until RN stroke swallow screen 5. Telemetry monitoring 6. Frequent neuro checks.  Patient still in  the treatment window  Case discussed with Dr. Debbora Dus, MD [facility] [phone] 09/19/2014, 2:44 AM

## 2014-09-19 NOTE — ED Notes (Signed)
Patient off the unit for mri.

## 2014-09-19 NOTE — Discharge Instructions (Signed)
Confusion Jackson Shaw, your MRI does not show any concern for a new stroke.  See a primary care doctor within 3 days for close follow up.  If symptoms worsen, come back to the ED immediately.  Thank you.  IMPRESSION: 1. No acute intracranial infarct or other abnormality identified. 2. Multiple remote lacunar infarcts involving the bilateral basal ganglia and pons. 3. Generalized cerebral atrophy with moderate chronic microvascular ischemic disease.  Confusion is the inability to think with your usual speed or clarity. Confusion may come on quickly or slowly over time. How quickly the confusion comes on depends on the cause. Confusion can be due to any number of causes. CAUSES   Concussion, head injury, or head trauma.  Seizures.  Stroke.  Fever.  Brain tumor.  Age related decreased brain function (dementia).  Heightened emotional states like rage or terror.  Mental illness in which the person loses the ability to determine what is real and what is not (hallucinations).  Infections such as a urinary tract infection (UTI).  Toxic effects from alcohol, drugs, or prescription medicines.  Dehydration and an imbalance of salts in the body (electrolytes).  Lack of sleep.  Low blood sugar (diabetes).  Low levels of oxygen from conditions such as chronic lung disorders.  Drug interactions or other medicine side effects.  Nutritional deficiencies, especially niacin, thiamine, vitamin C, or vitamin B.  Sudden drop in body temperature (hypothermia).  Change in routine, such as when traveling or hospitalized. SIGNS AND SYMPTOMS  People often describe their thinking as cloudy or unclear when they are confused. Confusion can also include feeling disoriented. That means you are unaware of where or who you are. You may also not know what the date or time is. If confused, you may also have difficulty paying attention, remembering, and making decisions. Some people also act aggressively  when they are confused.  DIAGNOSIS  The medical evaluation of confusion may include:  Blood and urine tests.  X-rays.  Brain and nervous system tests.  Analyzing your brain waves (electroencephalogram or EEG).  Magnetic resonance imaging (MRI) of your head.  Computed tomography (CT) scan of your head.  Mental status tests in which your health care provider may ask many questions. Some of these questions may seem silly or strange, but they are a very important test to help diagnose and treat confusion. TREATMENT  An admission to the hospital may not be needed, but a person with confusion should not be left alone. Stay with a family member or friend until the confusion clears. Avoid alcohol, pain relievers, or sedative drugs until you have fully recovered. Do not drive until directed by your health care provider. HOME CARE INSTRUCTIONS  What family and friends can do:  To find out if someone is confused, ask the person to state his or her name, age, and the date. If the person is unsure or answers incorrectly, he or she is confused.  Always introduce yourself, no matter how well the person knows you.  Often remind the person of his or her location.  Place a calendar and clock near the confused person.  Help the person with his or her medicines. You may want to use a pill box, an alarm as a reminder, or give the person each dose as prescribed.  Talk about current events and plans for the day.  Try to keep the environment calm, quiet, and peaceful.  Make sure the person keeps follow-up visits with his or her health care provider. PREVENTION  Ways to prevent confusion:  Avoid alcohol.  Eat a balanced diet.  Get enough sleep.  Take medicine only as directed by your health care provider.  Do not become isolated. Spend time with other people and make plans for your days.  Keep careful watch on your blood sugar levels if you are diabetic. SEEK IMMEDIATE MEDICAL CARE IF:    You develop severe headaches, repeated vomiting, seizures, blackouts, or slurred speech.  There is increasing confusion, weakness, numbness, restlessness, or personality changes.  You develop a loss of balance, have marked dizziness, feel uncoordinated, or fall.  You have delusions, hallucinations, or develop severe anxiety.  Your family members think you need to be rechecked. Document Released: 06/20/2004 Document Revised: 09/27/2013 Document Reviewed: 06/18/2013 [facility] Patient Information 2015 [place], [place]. This information is not intended to replace advice given to you by your health care provider. Make sure you discuss any questions you have with your health care provider.

## 2014-09-19 NOTE — ED Notes (Signed)
Patient returned from mri.  

## 2014-09-19 NOTE — ED Notes (Signed)
cbg of 113

## 2014-09-19 NOTE — ED Provider Notes (Signed)
CSN: [id number]     Arrival date & time 09/19/14  0035 History  This chart was scribed for Everlene Balls, MD by Eustaquio Maize, ED Scribe. This patient was seen in room D33C/D33C and the patient's care was started at 12:37 AM.    Chief Complaint  Patient presents with  . Code Stroke   LEVEL 5 CAVEAT  The history is provided by the patient and the EMS personnel. No language interpreter was used.     HPI Comments: Desmin Daleo is a 59 y.o. male brought in by ambulance, who presents to the Emergency Department complaining of syncopal episode that occurred earlier tonight. EMS reports that pt was responding to questions inappropriately. His BP was 80 and pulse was 40 en route. Pt was given fluid bolus which improved BP to 120 and pulse to 60. When asked what his name is, pt keeps responding with "23."     Past Medical History  Diagnosis Date  . Diabetes mellitus without complication   . Coronary artery disease   . CHF (congestive heart failure)   . Hyperlipidemia   . Hypertension   . Stroke   . Syncope and collapse   . HIV (human immunodeficiency virus infection)   . Cancer    Past Surgical History  Procedure Laterality Date  . Loop recorder implant     Family History  Problem Relation Age of Onset  . Heart disease Mother   . Diabetes Mother   . Cancer Mother    History  Substance Use Topics  . Smoking status: Never Smoker   . Smokeless tobacco: Never Used  . Alcohol Use: 0.0 oz/week    0 Standard drinks or equivalent per week     Comment: 6 per month    Review of Systems  Unable to perform ROS: Mental status change    Allergies  Codeine and Penicillins  Home Medications   Prior to Admission medications   Medication Sig Start Date End Date Taking? Authorizing Provider  aspirin EC 81 MG tablet Take 1 tablet by mouth daily. 08/12/12   Historical Provider, MD  atorvastatin (LIPITOR) 20 MG tablet Take 1 tablet by mouth daily. 06/15/13   Historical Provider, MD   citalopram (CELEXA) 10 MG tablet Take 1 tablet by mouth daily.    Historical Provider, MD  clopidogrel (PLAVIX) 75 MG tablet Take 1 tablet by mouth daily. 06/15/13   Historical Provider, MD  Darunavir Ethanolate (PREZISTA) 800 MG tablet Take 1 tablet by mouth daily.    Historical Provider, MD  dolutegravir (TIVICAY) 50 MG tablet Take 1 tablet by mouth daily.    Historical Provider, MD  emtricitabine-tenofovir (TRUVADA) 200-300 MG per tablet Take 1 tablet by mouth daily.    Historical Provider, MD  insulin aspart (NOVOLOG) 100 UNIT/ML injection Inject into the skin 3 (three) times daily before meals.    Historical Provider, MD  levothyroxine (SYNTHROID, LEVOTHROID) 75 MCG tablet Take 225 mcg by mouth daily.    Historical Provider, MD  metFORMIN (GLUCOPHAGE-XR) 500 MG 24 hr tablet Take 1 tablet by mouth 2 (two) times daily.    Historical Provider, MD  metoprolol succinate (TOPROL XL) 25 MG 24 hr tablet Take 1 tablet (25 mg total) by mouth daily. 08/08/14   Pixie Casino, MD  pantoprazole (PROTONIX) 40 MG tablet Take 1 tablet by mouth daily.    Historical Provider, MD  ritonavir (NORVIR) 100 MG capsule Take 1 capsule by mouth daily.    Historical Provider, MD  Triage Vitals: BP 130/74 mmHg  Pulse 67  Resp 12  Ht 5\' 9"  (1.753 m)  Wt 173 lb 1 oz (78.501 kg)  BMI 25.55 kg/m2  SpO2 100%    Physical Exam  Constitutional: Vital signs are normal. He appears well-developed and well-nourished.  Non-toxic appearance. He does not appear ill. No distress.  HENT:  Head: Normocephalic and atraumatic.  Nose: Nose normal.  Mouth/Throat: Oropharynx is clear and moist. No oropharyngeal exudate.  Eyes: Conjunctivae and EOM are normal. Pupils are equal, round, and reactive to light. No scleral icterus.  Neck: Normal range of motion. Neck supple. No tracheal deviation, no edema, no erythema and normal range of motion present. No thyroid mass and no thyromegaly present.  Cardiovascular: Normal rate, regular  rhythm, S1 normal, S2 normal, normal heart sounds, intact distal pulses and normal pulses.  Exam reveals no gallop and no friction rub.   No murmur heard. Pulses:      Radial pulses are 2+ on the right side, and 2+ on the left side.       Dorsalis pedis pulses are 2+ on the right side, and 2+ on the left side.  Pulmonary/Chest: Effort normal and breath sounds normal. No respiratory distress. He has no wheezes. He has no rhonchi. He has no rales.  Abdominal: Soft. Normal appearance and bowel sounds are normal. He exhibits no distension, no ascites and no mass. There is no hepatosplenomegaly. There is no tenderness. There is no rebound, no guarding and no CVA tenderness.  Musculoskeletal: Normal range of motion. He exhibits no edema or tenderness.  Lymphadenopathy:    He has no cervical adenopathy.  Neurological: He is alert. He has normal strength. No sensory deficit. He exhibits normal muscle tone.  Multiple inappropriate responses to questioning  Skin: Skin is warm, dry and intact. No petechiae and no rash noted. He is not diaphoretic. No erythema. No pallor.  Psychiatric: He has a normal mood and affect. His behavior is normal. Judgment normal.  Nursing note and vitals reviewed.   ED Course  Procedures (including critical care time)  DIAGNOSTIC STUDIES: Oxygen Saturation is 100% on RA, normal by my interpretation.    COORDINATION OF CARE: 12:40 AM-Discussed treatment plan which includes CT Head with pt at bedside and pt agreed to plan.   Labs Review Labs Reviewed  URINALYSIS, ROUTINE W REFLEX MICROSCOPIC - Abnormal; Notable for the following:    Protein, ur 30 (*)    All other components within normal limits  CBC WITH DIFFERENTIAL/PLATELET - Abnormal; Notable for the following:    Hemoglobin 12.3 (*)    HCT 35.5 (*)    All other components within normal limits  COMPREHENSIVE METABOLIC PANEL - Abnormal; Notable for the following:    Sodium 128 (*)    Glucose, Bld 107 (*)     Creatinine, Ser 1.41 (*)    Albumin 3.3 (*)    GFR calc non Af Amer 54 (*)    GFR calc Af Amer 62 (*)    All other components within normal limits  URINE MICROSCOPIC-ADD ON - Abnormal; Notable for the following:    Casts HYALINE CASTS (*)    All other components within normal limits  I-STAT CHEM 8, ED - Abnormal; Notable for the following:    Sodium 133 (*)    BUN 24 (*)    Glucose, Bld 110 (*)    All other components within normal limits  CBG MONITORING, ED - Abnormal; Notable for the following:  Glucose-Capillary 113 (*)    All other components within normal limits  URINE RAPID DRUG SCREEN (HOSP PERFORMED)  ETHANOL  PROTIME-INR  APTT  I-STAT TROPOININ, ED  Randolm Idol, ED    Imaging Review Dg Chest 2 View  09/19/2014   CLINICAL DATA:  Initial evaluation for acute altered mental status.  EXAM: CHEST  2 VIEW  COMPARISON:  Prior radiograph from 02/14/2010  FINDINGS: Median sternotomy wires of underlying CABG markers and surgical clips noted. Loop recorder in place. Coronary stent noted. Cardiac and mediastinal silhouettes are stable.  The lungs are normally inflated. No airspace consolidation, pleural effusion, or pulmonary edema is identified. There is no pneumothorax.  No acute osseous abnormality identified.  IMPRESSION: No active cardiopulmonary disease.   Electronically Signed   By: Jeannine Boga M.D.   On: 09/19/2014 03:05   Ct Head Wo Contrast  09/19/2014   CLINICAL DATA:  Code stroke: New onset dysphagia. Medical history includes HIV.  EXAM: CT HEAD WITHOUT CONTRAST  TECHNIQUE: Contiguous axial images were obtained from the base of the skull through the vertex without intravenous contrast.  COMPARISON:  None.  FINDINGS: Negative for acute intracranial hemorrhage, acute infarction, mass, mass effect, hydrocephalus or midline shift. Gray-white differentiation is preserved throughout. Age advanced periventricular, subcortical and deep white matter hypoattenuation. While  nonspecific, findings are most suggestive of chronic microvascular ischemic white matter disease. No focal soft tissue or calvarial abnormality. Globes and orbits are symmetric and intact bilaterally. Normal aeration of the mastoid air cells and visualized paranasal sinuses. Atherosclerotic calcification throughout both cavernous carotid arteries.  IMPRESSION: 1. No acute intracranial abnormality. 2. Age advanced white matter hypoattenuation most consistent with chronic microvascular ischemic white matter disease. 3. Intracranial atherosclerosis.   Electronically Signed   By: Jacqulynn Cadet M.D.   On: 09/19/2014 00:59   Mr Brain Wo Contrast  09/19/2014   CLINICAL DATA:  59 year old male presenting with syncope, found to be hypertensive and bradycardic. Evaluate for possible stroke.  EXAM: MRI HEAD WITHOUT CONTRAST  TECHNIQUE: Multiplanar, multiecho pulse sequences of the brain and surrounding structures were obtained without intravenous contrast.  COMPARISON:  Prior CT from earlier the same day.  FINDINGS: Diffuse prominence of the CSF containing spaces is compatible with generalized cerebral atrophy. Patchy and confluent T2/FLAIR hyperintensity within the periventricular and deep white matter both cerebral hemispheres most consistent with chronic small vessel ischemic disease. Multiple remote lacunar infarct present within the bilateral basal ganglia as well as the pons.  No abnormal foci of restricted diffusion to suggest acute intracranial infarct. Punctate focus of middle signal intensity on coronal DWI sequence adjacent to the left lateral ventricle is favored to be artifactual in nature (series 5, image 7). There is no acute or chronic intracranial hemorrhage. Gray-white matter differentiation maintained. Normal intravascular flow voids are preserved.  No mass lesion or midline shift. No mass effect. Ventricular prominence related to global parenchymal volume loss present without hydrocephalus. There is  no extra-axial fluid collection.  Craniocervical junction within normal limits. Pituitary gland normal.  No acute abnormality about the orbits.  Mucosal thickening present within the maxillary sinuses bilaterally, left greater than right. Mild mucosal thickening within the ethmoidal air cells as well. No air-fluid levels to suggest active sinus infection. There is no mastoid effusion. Inner ear structures are normal.  Bone marrow signal intensity within normal limits. Scalp soft tissues unremarkable.  IMPRESSION: 1. No acute intracranial infarct or other abnormality identified. 2. Multiple remote lacunar infarcts involving the bilateral basal  ganglia and pons. 3. Generalized cerebral atrophy with moderate chronic microvascular ischemic disease.   Electronically Signed   By: Jeannine Boga M.D.   On: 09/19/2014 06:59     EKG Interpretation   Date/Time:  Monday September 19 2014 01:05:30 EDT Ventricular Rate:  65 PR Interval:  189 QRS Duration: 108 QT Interval:  474 QTC Calculation: 493 R Axis:   -42 Text Interpretation:  Sinus rhythm Left axis deviation std in avl  Confirmed by Glynn Octave [phone]) on 09/19/2014 2:04:56 AM      MDM   Final diagnoses:  Altered mental status   patient presents emergency department concerning for an acute stroke. At home he was lightheaded and not responding correctly to questioning. Code stroke was paged prior to arrival, Dr. Doy Mince with neurology evaluated the patient. Patient rapidly improved back to his baseline. Dr. Doy Mince recommended MRI for evaluation if negative, patient can be safely discharged home with close follow-up. MRI reveals only multiple remote lacunar infarcts in the bilateral basal ganglia and pons. There is no acute intracranial infarcts. ED evaluation does not reveal any cause for patient's syncope. Patient has been monitored in the emergency department overnight, his vital signs remain within his normal limits and he is safe for  discharge.   I personally performed the services described in this documentation, which was scribed in my presence. The recorded information has been reviewed and is accurate.      Everlene Balls, MD 09/19/14 [phone]

## 2014-09-19 NOTE — ED Notes (Signed)
Spoke with MRI and Dr. Claudine Mouton in regards to loop recorder, interrogation not needed prior to transport to MRI.

## 2014-09-19 NOTE — ED Notes (Signed)
MRI called to inquire when the pt can go for procedure.

## 2014-09-19 NOTE — ED Notes (Signed)
Per ems, patient experienced syncope episode tonight. bp when ems arrived was in the 80s, and the heart rate was in the 40s. bp increased to 119/70, but patient began having expressive aphasia. History of 2 previous strokes, last one 3 years ago.

## 2014-09-19 NOTE — Code Documentation (Signed)
Jackson Shaw presented to [facility] this evening with acute onset expressive aphasia after having a syncopal episode.  When EMS arrived he was found to be quite hypotensive.  By the time he arrived at [facility] his BP was improved & he began to improve his speech.  He has a hx of stroke.  Originally [facility] and improved to [facility].

## 2014-11-01 ENCOUNTER — Encounter: Payer: Self-pay | Admitting: Cardiovascular Disease

## 2014-11-01 ENCOUNTER — Ambulatory Visit (INDEPENDENT_AMBULATORY_CARE_PROVIDER_SITE_OTHER): Payer: Medicaid Other | Admitting: Cardiovascular Disease

## 2014-11-01 VITALS — BP 143/82 | HR 86 | Ht 68.5 in | Wt 170.6 lb

## 2014-11-01 DIAGNOSIS — M316 Other giant cell arteritis: Secondary | ICD-10-CM | POA: Diagnosis not present

## 2014-11-01 DIAGNOSIS — I639 Cerebral infarction, unspecified: Secondary | ICD-10-CM | POA: Diagnosis not present

## 2014-11-01 DIAGNOSIS — R55 Syncope and collapse: Secondary | ICD-10-CM

## 2014-11-01 LAB — CUP PACEART INCLINIC DEVICE CHECK
MDC IDC SESS DTM: 20160607132514
MDC IDC SET ZONE DETECTION INTERVAL: 3000 ms
Zone Setting Detection Interval: 1500 ms
Zone Setting Detection Interval: 370 ms

## 2014-11-01 LAB — SEDIMENTATION RATE: Sed Rate: 35 mm/hr — ABNORMAL HIGH (ref 0–20)

## 2014-11-01 NOTE — Patient Instructions (Signed)
Medication Instructions:   Increase Metoprolol to 37.5mg  daily (1 1/2 tablets).  Labwork:  Sed Rate today at [facility] on the first floor.   Follow-Up:  Dr. Debara Pickett as directed.  One year with Dr. Sallyanne Kuster for Loop recorder check.  Any Other Special Instructions Will Be Listed Below (If Applicable).  A referral has been placed to [facility].

## 2014-11-03 NOTE — Progress Notes (Signed)
Patient ID: Jackson Shaw, male   DOB: [birth year], 59 y.o.   MRN: [id number]      Cardiology Office Note   Date:  11/03/2014   ID:  Jackson Shaw, DOB 06-07-1955, MRN [id number]  PCP:  No primary care provider on file.  Cardiologist:  K. Mali HILTY, MD;  Sanda Klein, MD   Chief Complaint  Patient presents with  . Follow-up    Loop recorder check.  2-3 weeks ago passed out and sent to [facility] ER by EMS. Thinks he may have had a stroke but was not worked up for this in the ER.  Since he has a limb and arm weakness of right arm. States he has chest pain frequently with SOB.  No edema or dizziness.      History of Present Illness: Jackson Shaw is a 59 y.o. male who presents for ED follow up after a near-syncopal event April 25. Passed out on toilet, preceded by longer period of weakness, altered vision and diaphoresis, followed by pallor and reported hypotension, bradycardia and slurred speech. Previous syncope last September had similar prodrome and was felt to be related to nitrate therapy per his report.  CT and MRI showed old strokes, no acute event. Six weeks after this last event, he believes he still has weakness in his right hand since that event. Loop recoder interrogation shows no sign of any arrhythmia - no significant bradycardia and no atrial fibrillation.  He has moved to [place] permanently. He has CAD, previous multiple NSTEMI and CABG (see recent cath below) and has moderate ischemic cardiomyopathy (EF 35%), without overt CHF. He has chest tightness and dyspnea every time he walks uphill, but never at rest or wih usual daily activities.  He has a long-standing history of HIV disease dating back to the 77s. He's been on a number of antiviral regimens and fortunately has survived secondary to this, however has suffered a number of consequences from his long-term disease and medical therapy. Other comorbidities include significant coronary artery disease and he status post 5 vessel  CABG in 2012 by Dr. Modesto Charon (Coronary artery bypass grafting x 5 (left internal mammary artery to left anterior descending, sequential saphenous vein graft to diagonal 1 and obtuse marginal 1, sequential saphenous vein graft to posterior descending and Posterolateral). This is following non-ST elevation MI, the fourth that he has had. He also reports having had 2 prior strokes. In addition he is an insulin-dependent diabetes with abnormal cholesterol and hypertension.  Left heart catheterization (01/20/14) revealed:   Left Ventriculography: 35% with anterior dk and inferior hk  Coronary Angiography: Left Main: 100 LAD: 100 prox. LCX: 99 prox RCA: 100 prox SVG to pda has a 50% prox lesion. It ties into a normal pda and backfillsa a small pl system through a severe lesion. This is unchanged from prior cath SVG to ramus: There are patent prox and mid stents. This graft had been a jump graft to diag and ramus but the diag is subtotally occluded at the anastamosis (unchanged from prior). The ramus is large LIMA to lad ties into mid lad. There is a 50 in the lad after the touchdown  Impression: Severe 3 v cad Moderately decreased lv ef 3/4 grafts patent with widely patent stents in the svg to ramus No change from prior cath   Past Medical History  Diagnosis Date  . Diabetes mellitus without complication   . Coronary artery disease   . CHF (congestive heart failure)   . Hyperlipidemia   .  Hypertension   . Stroke   . Syncope and collapse   . HIV (human immunodeficiency virus infection)   . Cancer     Past Surgical History  Procedure Laterality Date  . Loop recorder implant      Medtronic Reveal Model # [id number] MR safe     Current Outpatient Prescriptions  Medication Sig Dispense Refill  . aspirin EC 81 MG tablet Take 1 tablet by mouth daily.    Marland Kitchen atorvastatin (LIPITOR) 20 MG tablet Take 1 tablet by mouth daily.    . citalopram (CELEXA) 10 MG tablet Take 1 tablet by  mouth daily.    . clopidogrel (PLAVIX) 75 MG tablet Take 1 tablet by mouth daily.    . Darunavir Ethanolate (PREZISTA) 800 MG tablet Take 1 tablet by mouth daily.    . dolutegravir (TIVICAY) 50 MG tablet Take 1 tablet by mouth daily.    Marland Kitchen emtricitabine-tenofovir (TRUVADA) 200-300 MG per tablet Take 1 tablet by mouth daily.    . insulin aspart (NOVOLOG) 100 UNIT/ML injection Inject 6 Units into the skin 3 (three) times daily with meals. 6 units is the base. Add 2 units for each 15gm of carbs    . levothyroxine (SYNTHROID, LEVOTHROID) 75 MCG tablet Take 225 mcg by mouth daily.    . metFORMIN (GLUCOPHAGE-XR) 500 MG 24 hr tablet Take 1 tablet by mouth 2 (two) times daily.    . metoprolol succinate (TOPROL XL) 25 MG 24 hr tablet Take 1 tablet (25 mg total) by mouth daily. 30 tablet 6  . pantoprazole (PROTONIX) 40 MG tablet Take 1 tablet by mouth daily.    . ritonavir (NORVIR) 100 MG capsule Take 1 capsule by mouth daily.     No current facility-administered medications for this visit.    Allergies:   Penicillins and Codeine    Social History:  The patient  reports that he has never smoked. He has never used smokeless tobacco. He reports that he drinks alcohol.   Family History:  The patient's  family history includes Cancer in his mother; Diabetes in his mother; Heart disease in his mother.    ROS:  Please see the history of present illness.    Otherwise, review of systems positive for increased prominence of his bilateral temporal arteries, repeatedly mentioned by his friends and family. Denies headaches or visual disturbances..   All other systems are reviewed and negative.    PHYSICAL EXAM: VS:  BP 143/82 mmHg  Pulse 86  Ht 5' 8.5" (1.74 m)  Wt 77.384 kg (170 lb 9.6 oz)  BMI 25.56 kg/m2 , BMI Body mass index is 25.56 kg/(m^2).  General: Alert, oriented x3, no distress Head: no evidence of trauma, PERRL, EOMI, no exophtalmos or lid lag, no myxedema, no xanthelasma; normal ears, nose  and oropharynx. Distinctly visible temporal arteries without nodules or tenderness or erythema Neck: normal jugular venous pulsations and no hepatojugular reflux; brisk carotid pulses without delay and no carotid bruits Chest: clear to auscultation, no signs of consolidation by percussion or palpation, normal fremitus, symmetrical and full respiratory excursions Cardiovascular: normal position and quality of the apical impulse, regular rhythm, normal first and second heart sounds, 1-2/6 early peaking systolic ejection murmur at base, no diastolic murmurs, rubs or gallops Abdomen: no tenderness or distention, no masses by palpation, no abnormal pulsatility or arterial bruits, normal bowel sounds, no hepatosplenomegaly Extremities: no clubbing, cyanosis or edema; 2+ radial, ulnar and brachial pulses bilaterally; 2+ right femoral, posterior tibial and dorsalis pedis  pulses; 2+ left femoral, posterior tibial and dorsalis pedis pulses; no subclavian or femoral bruits Neurological: grossly nonfocal Psych: euthymic mood, full affect   EKG:  EKG is not ordered today. 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 Near-syncope - the prodromal symptoms, circumstances and the absence of recorded arrhythmia on ILR all support a diagnosis of neurally mediated syncope with a primary vasodepressor mechanism. Discussed  avoidance of typical triggers, adequate hydration, a slightly liberalized sodium intake. Avoid diuretics and vasodilators.  2. Implantable loop recorder - will switch [employer] downloads from [place] to our practice.  3. CAD s/p CABG with CCS class 2 effort angina - nitrates were implicated in previous syncope (again consistent with a hypotensive event) and calcium channel blockers should likewise be avoided. Ranexa would be great, but contraindicated with his protease inhibitors. Will try to gently increase the beta blocker dose, which might actually help mitigate risk of recurrent syncope as well.  4. Ischemic cardiomyopathy - . EF 35% without clear CHF. Not on RAAS inhibitors following (hypotensive) syncope.  5. Prominent temporal arteries - suspect this is due to weight loss, maybe lipodystrophy-fat redistribution. No clear symptoms of temporal arteritis. No tenderness to palpation or visual field cuts. With stroke history? Will check ESR - highly unlikely to be temporal arteritis unless ESR is markedly elevated.  5. Persistent subjective right hand weakness - have recommended he follow up with Neurology and continue ASA/plavix.   Current medicines are reviewed at length with the patient today.  The patient does not have concerns regarding medicines.  The following changes have been made:  Increase metoprolol succinate to 37.5 mg daily  Labs/ tests ordered today include:  Orders Placed This Encounter  Procedures  . Sed Rate (ESR)  . Ambulatory referral to Neurology  . Implantable device check   Patient Instructions  Medication Instructions:   Increase Metoprolol to 37.44m daily (1 1/2 tablets).  Labwork:  [facility]   Follow-Up:  Dr. HDebara Pickettas directed.  One year with Dr. CSallyanne Kusterfor Loop recorder check.  Any Other Special Instructions Will Be Listed Below (If Applicable).  [facility].      SMikael Spray MD  11/03/2014 8:45 PM    MSanda Klein MD, [facility] ([phone]

## 2014-11-07 ENCOUNTER — Encounter: Payer: Self-pay | Admitting: Internal Medicine

## 2014-11-07 ENCOUNTER — Ambulatory Visit (INDEPENDENT_AMBULATORY_CARE_PROVIDER_SITE_OTHER): Payer: Medicaid Other | Admitting: Internal Medicine

## 2014-11-07 VITALS — BP 150/80 | HR 80 | Ht 69.0 in | Wt 169.0 lb

## 2014-11-07 DIAGNOSIS — I209 Angina pectoris, unspecified: Secondary | ICD-10-CM | POA: Diagnosis not present

## 2014-11-07 DIAGNOSIS — E7800 Pure hypercholesterolemia, unspecified: Secondary | ICD-10-CM

## 2014-11-07 DIAGNOSIS — B20 Human immunodeficiency virus [HIV] disease: Secondary | ICD-10-CM | POA: Diagnosis not present

## 2014-11-07 DIAGNOSIS — Z951 Presence of aortocoronary bypass graft: Secondary | ICD-10-CM

## 2014-11-07 DIAGNOSIS — R55 Syncope and collapse: Secondary | ICD-10-CM

## 2014-11-07 DIAGNOSIS — R079 Chest pain, unspecified: Secondary | ICD-10-CM

## 2014-11-07 DIAGNOSIS — E1140 Type 2 diabetes mellitus with diabetic neuropathy, unspecified: Secondary | ICD-10-CM

## 2014-11-07 DIAGNOSIS — I255 Ischemic cardiomyopathy: Secondary | ICD-10-CM

## 2014-11-07 MED ORDER — ISOSORBIDE MONONITRATE ER 30 MG PO TB24: 15.0000 mg | ORAL_TABLET | Freq: Every day | ORAL | Status: DC

## 2014-11-07 NOTE — Progress Notes (Signed)
OFFICE NOTE  Chief Complaint:  Chest pain, recent syncope  Primary Care Physician: No primary care provider on file.  HPI:  Jackson Shaw is a complex 59 year old male who is a former patient of Dr. Addison Lank. 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 He's been on a number of antiviral regimens and fortunately has survived secondary to this, however has suffered a number of consequences from his long-term disease and medical therapy. Other comorbidities include significant coronary artery disease and he status post 5 vessel CABG in 2012 by Dr. Modesto Charon (Coronary artery bypass grafting x 5 (left internal mammary artery to left anterior descending, sequential saphenous vein graft to diagonal 1 and obtuse marginal 1, sequential saphenous vein graft to posterior descending and Posterolateral). This is following non-ST elevation MI, the fourth that he has had. He also reports having had 2 prior strokes. In addition he is an insulin-dependent diabetes with abnormal cholesterol and hypertension. He's not currently on any antihypertensive or heart failure/coronary artery disease medications due to syncope. He reports last summer he was having chest pain and had a syncopal event. He underwent heart catheterization which he reports was performed at [facility] by cardiologist with [facility]. The results are as follows:  Left heart catheterization (01/20/14)  revealed:   Left Ventriculography: 35% with anterior dk and inferior hk  Coronary Angiography: Left Main: 100 LAD: 100 prox. LCX: 99 prox RCA: 100 prox SVG to pda has a 50% prox lesion. It ties into a normal pda and backfillsa a small pl system through a severe lesion. This is unchanged from prior cath SVG to ramus: There are patent prox and mid stents. This graft had been a jump graft to diag and ramus but the diag is subtotally occluded at the anastamosis (unchanged  from prior). The ramus is large LIMA to lad ties into mid lad. There is a 50 in the lad after the touchdown  Impression: Severe 3 v cad Moderately decreased lv ef 3/4 grafts patent with widely patent stents in the svg to ramus No change from prior cath  He subsequently had placement of a Medtronic Linq loop recorder. No clear etiology of his syncope has been found. In addition he continued to have chest pain symptoms and had increasing doses of Imdur were prescribed. He is currently on 60 mg twice daily. He reports that this is not clearly helped his symptoms. He is not currently on any heart failure medications, although his EF was 35%.  I saw Mr. Haffey back in the office today. He was seen by Dr. Loletha Grayer last week for evaluation of his loop recorder. He had one syncopal episode and that was not demonstrated on the loop recorder. He subsequently been enrolled with our office. We'll continue to monitor him. He continues to have chest pain symptoms. He was previously on imdur, however this was stopped due to possible orthostasis. He is now having anginal symptoms again. Unfortunately he's not a candidate for ranolazine due to his protease inhibitors. As last office visit I placed him on metoprolol and seems to be tolerating that. Blood pressure actually is a little elevated today. He does not think it helped his angina. He still has shortness of breath and decreased exercise tolerance especially when walking up hills associated with chest discomfort.  PMHx:  Past Medical History  Diagnosis Date  . Diabetes mellitus without complication   . Coronary artery disease   .  CHF (congestive heart failure)   . Hyperlipidemia   . Hypertension   . Stroke   . Syncope and collapse   . HIV (human immunodeficiency virus infection)   . Cancer     Past Surgical History  Procedure Laterality Date  . Loop recorder implant      Medtronic Reveal Model # [id number] MR safe    FAMHx:  Family History  Problem  Relation Age of Onset  . Heart disease Mother   . Diabetes Mother   . Cancer Mother     SOCHx:   reports that he has never smoked. He has never used smokeless tobacco. He reports that he drinks alcohol. His drug history is not on file.  ALLERGIES:  Allergies  Allergen Reactions  . Penicillins Anaphylaxis  . Codeine Nausea Only    ROS: A comprehensive review of systems was negative except for: Cardiovascular: positive for chest pain Neurological: positive for Syncope  HOME MEDS: Current Outpatient Prescriptions  Medication Sig Dispense Refill  . aspirin EC 81 MG tablet Take 1 tablet by mouth daily.    Marland Kitchen atorvastatin (LIPITOR) 20 MG tablet Take 1 tablet by mouth daily.    . citalopram (CELEXA) 10 MG tablet Take 1 tablet by mouth daily.    . clopidogrel (PLAVIX) 75 MG tablet Take 1 tablet by mouth daily.    . Darunavir Ethanolate (PREZISTA) 800 MG tablet Take 1 tablet by mouth daily.    . dolutegravir (TIVICAY) 50 MG tablet Take 1 tablet by mouth daily.    Marland Kitchen emtricitabine-tenofovir (TRUVADA) 200-300 MG per tablet Take 1 tablet by mouth daily.    . insulin aspart (NOVOLOG) 100 UNIT/ML injection Inject 6 Units into the skin 3 (three) times daily with meals. 6 units is the base. Add 2 units for each 15gm of carbs    . levothyroxine (SYNTHROID, LEVOTHROID) 75 MCG tablet Take 225 mcg by mouth daily.    . metFORMIN (GLUCOPHAGE-XR) 500 MG 24 hr tablet Take 1 tablet by mouth 2 (two) times daily.    . metoprolol succinate (TOPROL XL) 25 MG 24 hr tablet Take 1 tablet (25 mg total) by mouth daily. 30 tablet 6  . pantoprazole (PROTONIX) 40 MG tablet Take 1 tablet by mouth daily.    . ritonavir (NORVIR) 100 MG capsule Take 1 capsule by mouth daily.     No current facility-administered medications for this visit.    LABS/IMAGING: No results found for this or any previous visit (from the past 48 hour(s)). No results found.  VITALS: BP 150/80 mmHg  Pulse 80  Ht 5\' 9"  (1.753 m)  Wt 169 lb  (76.658 kg)  BMI 24.95 kg/m2  EXAM: General appearance: alert and no distress Neck: no carotid bruit and no JVD Lungs: clear to auscultation bilaterally Heart: regular rate and rhythm, S1, S2 normal and systolic murmur: early systolic 2/6, crescendo at lower left sternal border Abdomen: soft, non-tender; bowel sounds normal; no masses,  no organomegaly Extremities: extremities normal, atraumatic, no cyanosis or edema Pulses: 2+ and symmetric Skin: Skin color, texture, turgor normal. No rashes or lesions Neurologic: Grossly normal Psych: Pleasant, normal mood, affect  EKG: deferred  ASSESSMENT: 1. Coronary artery disease status post 5 vessel bypass in 2012 (1 skipped grafts-3 out of 4 grafts patent by catheterization in August 2015) 2. Ischemic cardiopathy, EF 35%-NYHA class I-II symptoms 3. Long-standing HIV disease since the 1980s 4. Insulin-dependent diabetes 5. Hypertension 6. Dyslipidemia 7. Peripheral and possibly autonomic neuropathy 8. Recurrent syncope-status post  loop recorder implant 9. Stable angina n- recent worsening  PLAN: 1.   Mr. Aliano continues to report exertional chest discomfort. He believes his symptoms may be getting worse. He was taken off of his nitrates due to probable orthostatic syncope. I suspect he has autonomic neuropathy and we know he has peripheral neuropathy. He's recently been complaining of some weakness in his right arm and right leg and is scheduled to see a neurologist. Unfortunately, we cannot use ranolazine for treatment of his angina due to interaction with the protease inhibitors. I recommend we try a low-dose of isosorbide to see if this helps with his angina. I will also recommend a Lexiscan nuclear stress test as his catheterization was last in August 2015. He could've had graft progression since that time as his symptoms are progressively worse.  Plan to see him back in a few weeks to discuss results of this study.  Pixie Casino,  MD, [facility] Attending Cardiologist [place] [facility] 11/07/2014, 8:57 AM

## 2014-11-07 NOTE — Patient Instructions (Signed)
Medication Instructions:  START Isosorbide Mononitrate (Imdur) 30 mg - take 1/2 tablet daily. A prescription has been sent to your pharmacy.  Testing/Procedures: Dr Debara Pickett has requested that you have a lexiscan myoview. For further information please visit [email or web address]. Please follow instruction sheet, as given.  Follow-Up: Dr Debara Pickett recommends that you schedule a follow-up appointment in 1 year. You will receive a reminder letter in the mail two months in advance. If you don't receive a letter, please call our office to schedule the follow-up appointment.

## 2014-12-01 ENCOUNTER — Telehealth (HOSPITAL_COMMUNITY): Payer: Self-pay

## 2014-12-01 ENCOUNTER — Encounter: Payer: Self-pay | Admitting: Cardiology

## 2014-12-01 NOTE — Telephone Encounter (Signed)
Encounter complete. 

## 2014-12-06 ENCOUNTER — Encounter (HOSPITAL_COMMUNITY): Payer: Medicaid Other

## 2014-12-07 ENCOUNTER — Encounter: Payer: Self-pay | Admitting: Cardiology

## 2014-12-14 ENCOUNTER — Encounter: Payer: Self-pay | Admitting: Cardiology

## 2014-12-22 ENCOUNTER — Encounter: Payer: Self-pay | Admitting: Neurology

## 2014-12-22 ENCOUNTER — Ambulatory Visit (INDEPENDENT_AMBULATORY_CARE_PROVIDER_SITE_OTHER): Payer: Medicaid Other | Admitting: Neurology

## 2014-12-22 VITALS — BP 154/90 | HR 88 | Ht 69.0 in | Wt 170.2 lb

## 2014-12-22 DIAGNOSIS — R55 Syncope and collapse: Secondary | ICD-10-CM | POA: Diagnosis not present

## 2014-12-22 DIAGNOSIS — E1140 Type 2 diabetes mellitus with diabetic neuropathy, unspecified: Secondary | ICD-10-CM

## 2014-12-22 DIAGNOSIS — E1142 Type 2 diabetes mellitus with diabetic polyneuropathy: Secondary | ICD-10-CM | POA: Diagnosis not present

## 2014-12-22 HISTORY — DX: Type 2 diabetes mellitus with diabetic neuropathy, unspecified: E11.40

## 2014-12-22 MED ORDER — GABAPENTIN 300 MG PO CAPS: ORAL_CAPSULE | ORAL | Status: AC

## 2014-12-22 NOTE — Patient Instructions (Addendum)
We will set she will for EMG and nerve conduction study evaluation to look for the cause of the right leg weakness and numbness. We will start gabapentin for the neuropathy pain. We will follow-up for the above study.  Peripheral Neuropathy Peripheral neuropathy is a type of nerve damage. It affects nerves that carry signals between the spinal cord and other parts of the body. These are called peripheral nerves. With peripheral neuropathy, one nerve or a group of nerves may be damaged.  CAUSES  Many things can damage peripheral nerves. For some people with peripheral neuropathy, the cause is unknown. Some causes include:  Diabetes. This is the most common cause of peripheral neuropathy.  Injury to a nerve.  Pressure or stress on a nerve that lasts a long time.  Too little vitamin B. Alcoholism can lead to this.  Infections.  Autoimmune diseases, such as multiple sclerosis and systemic lupus erythematosus.  Inherited nerve diseases.  Some medicines, such as cancer drugs.  Toxic substances, such as lead and mercury.  Too little blood flowing to the legs.  Kidney disease.  Thyroid disease. SIGNS AND SYMPTOMS  Different people have different symptoms. The symptoms you have will depend on which of your nerves is damaged. Common symptoms include:  Loss of feeling (numbness) in the feet and hands.  Tingling in the feet and hands.  Pain that burns.  Very sensitive skin.  Weakness.  Not being able to move a part of the body (paralysis).  Muscle twitching.  Clumsiness or poor coordination.  Loss of balance.  Not being able to control your bladder.  Feeling dizzy.  Sexual problems. DIAGNOSIS  Peripheral neuropathy is a symptom, not a disease. Finding the cause of peripheral neuropathy can be hard. To figure that out, your health care provider will take a medical history and do a physical exam. A neurological exam will also be done. This involves checking things  affected by your brain, spinal cord, and nerves (nervous system). For example, your health care provider will check your reflexes, how you move, and what you can feel.  Other types of tests may also be ordered, such as:  Blood tests.  A test of the fluid in your spinal cord.  Imaging tests, such as CT scans or an MRI.  Electromyography (EMG). This test checks the nerves that control muscles.  Nerve conduction velocity tests. These tests check how fast messages pass through your nerves.  Nerve biopsy. A small piece of nerve is removed. It is then checked under a microscope. TREATMENT   Medicine is often used to treat peripheral neuropathy. Medicines may include:  Pain-relieving medicines. Prescription or over-the-counter medicine may be suggested.  Antiseizure medicine. This may be used for pain.  Antidepressants. These also may help ease pain from neuropathy.  Lidocaine. This is a numbing medicine. You might wear a patch or be given a shot.  Mexiletine. This medicine is typically used to help control irregular heart rhythms.  Surgery. Surgery may be needed to relieve pressure on a nerve or to destroy a nerve that is causing pain.  Physical therapy to help movement.  Assistive devices to help movement. HOME CARE INSTRUCTIONS   Only take over-the-counter or prescription medicines as directed by your health care provider. Follow the instructions carefully for any given medicines. Do not take any other medicines without first getting approval from your health care provider.  If you have diabetes, work closely with your health care provider to keep your blood sugar under  control.  If you have numbness in your feet:  Check every day for signs of injury or infection. Watch for redness, warmth, and swelling.  Wear padded socks and comfortable shoes. These help protect your feet.  Do not do things that put pressure on your damaged nerve.  Do not smoke. Smoking keeps blood from  getting to damaged nerves.  Avoid or limit alcohol. Too much alcohol can cause a lack of B vitamins. These vitamins are needed for healthy nerves.  Develop a good support system. Coping with peripheral neuropathy can be stressful. Talk to a mental health specialist or join a support group if you are struggling.  Follow up with your health care provider as directed. SEEK MEDICAL CARE IF:   You have new signs or symptoms of peripheral neuropathy.  You are struggling emotionally from dealing with peripheral neuropathy.  You have a fever. SEEK IMMEDIATE MEDICAL CARE IF:   You have an injury or infection that is not healing.  You feel very dizzy or begin vomiting.  You have chest pain.  You have trouble breathing. Document Released: 05/03/2002 Document Revised: 01/23/2011 Document Reviewed: 01/18/2013 [facility] Patient Information 2015 [place], [place]. This information is not intended to replace advice given to you by your health care provider. Make sure you discuss any questions you have with your health care provider.

## 2014-12-22 NOTE — Progress Notes (Signed)
Reason for visit: Right leg weakness  Referring physician: Lamy  [facility] is a 59 y.o. male  History of present illness:  [facility] is a 59 year old white male with a history of diabetes, hypertension, and coronary artery disease. He was seen in the emergency room on 09/19/2014 following a syncopal event that occurred at home. The patient felt poorly, and was diaphoretic, and pale. The patient lost consciousness briefly. He has had a loop recorder placed, he has not been told that he has any problems with heart rhythm abnormalities. He has not had any further blackouts. Since the syncopal event in April, the patient has noted some right leg weakness that has been gradually worsening over time. He does have a history of a diabetic peripheral neuropathy, both feet bother him in the evening hours, but the right leg is more painful for him. The patient indicates that his right leg will fatigue with walking. He denies any low back pain or pain down the leg. He feels that his balance has in general worsened over the last several months. He comes to this office for further evaluation. He denies any issues controlling the bowels or the bladder. The patient is not on any medications for his neuropathy discomfort. In the emergency room, MRI evaluation of the brain was done showing a moderate level of small vessel ischemic changes that was chronic in nature, no acute changes were seen.  Past Medical History  Diagnosis Date  . Diabetes mellitus without complication   . Coronary artery disease   . CHF (congestive heart failure)   . Hyperlipidemia   . Hypertension   . Stroke   . Syncope and collapse   . HIV (human immunodeficiency virus infection)   . Cancer   . Diabetic neuropathy 12/22/2014    Past Surgical History  Procedure Laterality Date  . Loop recorder implant      Medtronic Reveal Model # [id number] MR safe    Family History  Problem Relation Age of Onset  . Heart disease Mother     . Diabetes Mother   . Cancer Mother   . Heart attack Father   . Stroke Sister     ICH  . Diabetes Sister     Social history:  reports that he has never smoked. He has never used smokeless tobacco. He reports that he drinks alcohol. His drug history is not on file.  Medications:  Prior to Admission medications   Medication Sig Start Date End Date Taking? Authorizing Provider  aspirin EC 81 MG tablet Take 1 tablet by mouth daily. 08/12/12  Yes Historical Provider, MD  atorvastatin (LIPITOR) 20 MG tablet Take 1 tablet by mouth daily. 06/15/13  Yes Historical Provider, MD  citalopram (CELEXA) 10 MG tablet Take 1 tablet by mouth daily.   Yes Historical Provider, MD  clopidogrel (PLAVIX) 75 MG tablet Take 1 tablet by mouth daily. 06/15/13  Yes Historical Provider, MD  Darunavir Ethanolate (PREZISTA) 800 MG tablet Take 1 tablet by mouth daily.   Yes Historical Provider, MD  dolutegravir (TIVICAY) 50 MG tablet Take 1 tablet by mouth daily.   Yes Historical Provider, MD  emtricitabine-tenofovir (TRUVADA) 200-300 MG per tablet Take 1 tablet by mouth daily.   Yes Historical Provider, MD  insulin aspart (NOVOLOG) 100 UNIT/ML injection Inject 6 Units into the skin 3 (three) times daily with meals. 6 units is the base. Add 2 units for each 15gm of carbs   Yes Historical Provider, MD  isosorbide mononitrate (IMDUR) 30 MG 24 hr tablet Take 0.5 tablets (15 mg total) by mouth daily. 11/07/14  Yes Pixie Casino, MD  levothyroxine (SYNTHROID, LEVOTHROID) 75 MCG tablet Take 225 mcg by mouth daily.   Yes Historical Provider, MD  metFORMIN (GLUCOPHAGE-XR) 500 MG 24 hr tablet Take 1 tablet by mouth 2 (two) times daily.   Yes Historical Provider, MD  metoprolol succinate (TOPROL XL) 25 MG 24 hr tablet Take 1 tablet (25 mg total) by mouth daily. 08/08/14  Yes Pixie Casino, MD  pantoprazole (PROTONIX) 40 MG tablet Take 1 tablet by mouth daily.   Yes Historical Provider, MD  ritonavir (NORVIR) 100 MG capsule Take 1  capsule by mouth daily.   Yes Historical Provider, MD      Allergies  Allergen Reactions  . Penicillins Anaphylaxis  . Codeine Nausea Only    ROS:  Out of a complete 14 system review of symptoms, the patient complains only of the following symptoms, and all other reviewed systems are negative.  Chest pain Shortness of breath Urination problems Allergies Memory loss, confusion, numbness, weakness, slurred speech, syncope Snoring  Blood pressure 154/90, pulse 88, height 5\' 9"  (1.753 m), weight 170 lb 3.2 oz (77.202 kg).  Physical Exam  General: The patient is alert and cooperative at the time of the examination.  Eyes: Pupils are equal, round, and reactive to light. Discs are flat bilaterally.  Neck: The neck is supple, no carotid bruits are noted.  Respiratory: The respiratory examination is clear.  Cardiovascular: The cardiovascular examination reveals a regular rate and rhythm, no obvious murmurs or rubs are noted.  Skin: Extremities are without significant edema.  Neurologic Exam  Mental status: The patient is alert and oriented x 3 at the time of the examination. The patient has apparent normal recent and remote memory, with an apparently normal attention span and concentration ability.  Cranial nerves: Facial symmetry is present. There is good sensation of the face to pinprick and soft touch bilaterally. The strength of the facial muscles and the muscles to head turning and shoulder shrug are normal bilaterally. Speech is well enunciated, no aphasia or dysarthria is noted. Extraocular movements are full. Visual fields are full. The tongue is midline, and the patient has symmetric elevation of the soft palate. No obvious hearing deficits are noted.  Motor: The motor testing reveals 5 over 5 strength of all 4 extremities, with a slight weakness with dorsiflexion of the right foot. Good symmetric motor tone is noted throughout.  Sensory: Sensory testing is intact to  pinprick, soft touch, vibration sensation, and position sense on all 4 extremities, with exception of a stocking pattern pinprick deficit on the right leg up to the knee, decreased vibration and position sense on the right foot. No evidence of extinction is noted.  Coordination: Cerebellar testing reveals good finger-nose-finger and heel-to-shin bilaterally.  Gait and station: Gait is normal. Tandem gait is slightly unsteady. Romberg is negative. No drift is seen. The patient has difficulty walking on heels and the toes bilaterally.  Reflexes: Deep tendon reflexes are symmetric and normal bilaterally, with exception that the ankle jerk reflexes are depressed bilaterally. Toes are downgoing bilaterally.   MRI brain 09/19/14:  IMPRESSION: 1. No acute intracranial infarct or other abnormality identified. 2. Multiple remote lacunar infarcts involving the bilateral basal ganglia and pons. 3. Generalized cerebral atrophy with moderate chronic microvascular ischemic disease.  * MRI scan images were reviewed online. I agree with the written report.  Assessment/Plan:  1. Cerebrovascular disease, chronic small vessel disease  2. Diabetes, diabetic peripheral neuropathy  3. Right leg numbness and weakness  The patient has had a slow progression of right leg numbness and weakness over the last several months. This may represent issues with his diabetic neuropathy, he may have a mild right footdrop. The patient will be set up for nerve conduction studies of both legs, EMG on the right leg. Gabapentin will be added for the neuropathy pain beginning at 300 mg at night for 2 weeks, then go to 300 mg twice daily. The patient will follow-up for the EMG study.  Jill Alexanders MD 12/22/2014 7:56 PM  [facility] [address] [place], [place] [address]  Phone [phone] Fax [phone]

## 2014-12-30 ENCOUNTER — Encounter: Payer: Self-pay | Admitting: Cardiology

## 2015-01-04 ENCOUNTER — Encounter: Payer: Self-pay | Admitting: Cardiology

## 2015-01-05 ENCOUNTER — Encounter: Payer: Medicaid Other | Admitting: Neurology

## 2015-01-11 ENCOUNTER — Encounter: Payer: Self-pay | Admitting: Cardiology

## 2015-01-19 ENCOUNTER — Encounter: Payer: Self-pay | Admitting: Cardiology

## 2015-01-26 ENCOUNTER — Encounter: Payer: Self-pay | Admitting: Cardiology

## 2015-01-27 ENCOUNTER — Ambulatory Visit (INDEPENDENT_AMBULATORY_CARE_PROVIDER_SITE_OTHER): Payer: Medicaid Other | Admitting: *Deleted

## 2015-01-27 DIAGNOSIS — R55 Syncope and collapse: Secondary | ICD-10-CM | POA: Diagnosis not present

## 2015-01-31 ENCOUNTER — Encounter: Payer: Medicaid Other | Admitting: Neurology

## 2015-02-02 NOTE — Progress Notes (Signed)
Loop recorder 

## 2015-02-03 ENCOUNTER — Encounter: Payer: Self-pay | Admitting: Cardiology

## 2015-02-09 ENCOUNTER — Encounter: Payer: Self-pay | Admitting: Cardiology

## 2015-02-10 LAB — CUP PACEART REMOTE DEVICE CHECK: Date Time Interrogation Session: 20160916082854

## 2015-02-10 NOTE — Progress Notes (Signed)
Carelink summary report received. Battery status OK. Normal device function. No new symptom episodes, tachy episodes, brady, or pause episodes. No new AF episodes. Monthly summary reports and ROV with [place] in 10/2015.

## 2015-02-15 ENCOUNTER — Encounter: Payer: Self-pay | Admitting: Cardiology

## 2015-02-21 ENCOUNTER — Telehealth: Payer: Self-pay | Admitting: Neurology

## 2015-02-21 ENCOUNTER — Encounter: Payer: Medicaid Other | Admitting: Neurology

## 2015-02-21 NOTE — Telephone Encounter (Signed)
This patient did not show for EMG appointment today. 

## 2015-02-22 ENCOUNTER — Encounter: Payer: Self-pay | Admitting: Neurology

## 2015-03-13 ENCOUNTER — Encounter: Payer: Self-pay | Admitting: Cardiovascular Disease

## 2015-03-28 DIAGNOSIS — I214 Non-ST elevation (NSTEMI) myocardial infarction: Secondary | ICD-10-CM

## 2015-03-28 HISTORY — DX: Non-ST elevation (NSTEMI) myocardial infarction: I21.4

## 2015-03-31 ENCOUNTER — Ambulatory Visit: Payer: No Typology Code available for payment source | Admitting: Physical Therapy

## 2015-04-05 ENCOUNTER — Ambulatory Visit: Payer: No Typology Code available for payment source | Admitting: *Deleted

## 2015-04-05 DIAGNOSIS — Z8673 Personal history of transient ischemic attack (TIA), and cerebral infarction without residual deficits: Secondary | ICD-10-CM | POA: Diagnosis present

## 2015-04-10 ENCOUNTER — Ambulatory Visit: Payer: No Typology Code available for payment source | Attending: Neurology | Admitting: Rehabilitation

## 2015-04-11 ENCOUNTER — Ambulatory Visit: Payer: No Typology Code available for payment source

## 2015-04-11 ENCOUNTER — Ambulatory Visit: Payer: No Typology Code available for payment source | Admitting: Occupational Therapy

## 2015-04-19 ENCOUNTER — Other Ambulatory Visit (HOSPITAL_COMMUNITY): Payer: Medicaid Other

## 2015-04-19 ENCOUNTER — Inpatient Hospital Stay (HOSPITAL_COMMUNITY)
Admission: EM | Admit: 2015-04-19 | Discharge: 2015-04-20 | DRG: 246 | Disposition: A | Discharge status: Home / Self Care | Payer: Medicaid Other | Attending: Cardiovascular Disease | Admitting: Cardiovascular Disease

## 2015-04-19 ENCOUNTER — Emergency Department (HOSPITAL_COMMUNITY): Payer: Medicaid Other

## 2015-04-19 ENCOUNTER — Inpatient Hospital Stay (HOSPITAL_COMMUNITY): Payer: Medicaid Other

## 2015-04-19 ENCOUNTER — Encounter (HOSPITAL_COMMUNITY): Payer: Self-pay

## 2015-04-19 ENCOUNTER — Encounter (HOSPITAL_COMMUNITY)
Admission: EM | Disposition: A | Discharge status: Home / Self Care | Payer: Self-pay | Source: Home / Self Care | Attending: Cardiovascular Disease

## 2015-04-19 DIAGNOSIS — E7800 Pure hypercholesterolemia, unspecified: Secondary | ICD-10-CM | POA: Diagnosis not present

## 2015-04-19 DIAGNOSIS — Z885 Allergy status to narcotic agent status: Secondary | ICD-10-CM | POA: Diagnosis not present

## 2015-04-19 DIAGNOSIS — Z951 Presence of aortocoronary bypass graft: Secondary | ICD-10-CM

## 2015-04-19 DIAGNOSIS — I25710 Atherosclerosis of autologous vein coronary artery bypass graft(s) with unstable angina pectoris: Secondary | ICD-10-CM | POA: Diagnosis present

## 2015-04-19 DIAGNOSIS — N182 Chronic kidney disease, stage 2 (mild): Secondary | ICD-10-CM | POA: Diagnosis present

## 2015-04-19 DIAGNOSIS — K219 Gastro-esophageal reflux disease without esophagitis: Secondary | ICD-10-CM | POA: Diagnosis present

## 2015-04-19 DIAGNOSIS — I25110 Atherosclerotic heart disease of native coronary artery with unstable angina pectoris: Secondary | ICD-10-CM | POA: Diagnosis present

## 2015-04-19 DIAGNOSIS — R079 Chest pain, unspecified: Secondary | ICD-10-CM | POA: Diagnosis present

## 2015-04-19 DIAGNOSIS — E785 Hyperlipidemia, unspecified: Secondary | ICD-10-CM | POA: Diagnosis present

## 2015-04-19 DIAGNOSIS — Z8673 Personal history of transient ischemic attack (TIA), and cerebral infarction without residual deficits: Secondary | ICD-10-CM | POA: Diagnosis not present

## 2015-04-19 DIAGNOSIS — I5020 Unspecified systolic (congestive) heart failure: Secondary | ICD-10-CM | POA: Diagnosis present

## 2015-04-19 DIAGNOSIS — Z833 Family history of diabetes mellitus: Secondary | ICD-10-CM | POA: Diagnosis not present

## 2015-04-19 DIAGNOSIS — E1165 Type 2 diabetes mellitus with hyperglycemia: Secondary | ICD-10-CM

## 2015-04-19 DIAGNOSIS — I639 Cerebral infarction, unspecified: Secondary | ICD-10-CM

## 2015-04-19 DIAGNOSIS — I130 Hypertensive heart and chronic kidney disease with heart failure and stage 1 through stage 4 chronic kidney disease, or unspecified chronic kidney disease: Secondary | ICD-10-CM | POA: Diagnosis present

## 2015-04-19 DIAGNOSIS — Z79899 Other long term (current) drug therapy: Secondary | ICD-10-CM

## 2015-04-19 DIAGNOSIS — Z95818 Presence of other cardiac implants and grafts: Secondary | ICD-10-CM | POA: Diagnosis present

## 2015-04-19 DIAGNOSIS — IMO0002 Reserved for concepts with insufficient information to code with codable children: Secondary | ICD-10-CM

## 2015-04-19 DIAGNOSIS — Z794 Long term (current) use of insulin: Secondary | ICD-10-CM

## 2015-04-19 DIAGNOSIS — Z8249 Family history of ischemic heart disease and other diseases of the circulatory system: Secondary | ICD-10-CM | POA: Diagnosis not present

## 2015-04-19 DIAGNOSIS — E1122 Type 2 diabetes mellitus with diabetic chronic kidney disease: Secondary | ICD-10-CM | POA: Diagnosis present

## 2015-04-19 DIAGNOSIS — I25700 Atherosclerosis of coronary artery bypass graft(s), unspecified, with unstable angina pectoris: Secondary | ICD-10-CM

## 2015-04-19 DIAGNOSIS — I6312 Cerebral infarction due to embolism of basilar artery: Secondary | ICD-10-CM | POA: Diagnosis not present

## 2015-04-19 DIAGNOSIS — E039 Hypothyroidism, unspecified: Secondary | ICD-10-CM | POA: Diagnosis present

## 2015-04-19 DIAGNOSIS — Z880 Allergy status to penicillin: Secondary | ICD-10-CM

## 2015-04-19 DIAGNOSIS — Z823 Family history of stroke: Secondary | ICD-10-CM

## 2015-04-19 DIAGNOSIS — Z955 Presence of coronary angioplasty implant and graft: Secondary | ICD-10-CM

## 2015-04-19 DIAGNOSIS — E1140 Type 2 diabetes mellitus with diabetic neuropathy, unspecified: Secondary | ICD-10-CM | POA: Diagnosis present

## 2015-04-19 DIAGNOSIS — B20 Human immunodeficiency virus [HIV] disease: Secondary | ICD-10-CM | POA: Diagnosis present

## 2015-04-19 DIAGNOSIS — I214 Non-ST elevation (NSTEMI) myocardial infarction: Secondary | ICD-10-CM | POA: Diagnosis present

## 2015-04-19 DIAGNOSIS — R55 Syncope and collapse: Secondary | ICD-10-CM | POA: Diagnosis present

## 2015-04-19 DIAGNOSIS — Z7902 Long term (current) use of antithrombotics/antiplatelets: Secondary | ICD-10-CM | POA: Diagnosis not present

## 2015-04-19 DIAGNOSIS — Z7982 Long term (current) use of aspirin: Secondary | ICD-10-CM

## 2015-04-19 HISTORY — DX: Chronic systolic (congestive) heart failure: I50.22

## 2015-04-19 HISTORY — PX: CARDIAC CATHETERIZATION: SHX172

## 2015-04-19 LAB — BASIC METABOLIC PANEL
ANION GAP: 12 (ref 5–15)
BUN: 18 mg/dL (ref 6–20)
CALCIUM: 9.5 mg/dL (ref 8.9–10.3)
CO2: 22 mmol/L (ref 22–32)
Chloride: 99 mmol/L — ABNORMAL LOW (ref 101–111)
Creatinine, Ser: 1.49 mg/dL — ABNORMAL HIGH (ref 0.61–1.24)
GFR calc Af Amer: 58 mL/min — ABNORMAL LOW (ref >60)
GFR, EST NON AFRICAN AMERICAN: 50 mL/min — AB (ref >60)
GLUCOSE: 298 mg/dL — AB (ref 65–99)
Potassium: 5.1 mmol/L (ref 3.5–5.1)
SODIUM: 133 mmol/L — AB (ref 135–145)

## 2015-04-19 LAB — CBC
HCT: 40.6 % (ref 39.0–52.0)
Hemoglobin: 13.5 g/dL (ref 13.0–17.0)
MCH: 29.2 pg (ref 26.0–34.0)
MCHC: 33.3 g/dL (ref 30.0–36.0)
MCV: 87.9 fL (ref 78.0–100.0)
PLATELETS: 179 10*3/uL (ref 150–400)
RBC: 4.62 MIL/uL (ref 4.22–5.81)
RDW: 12.9 % (ref 11.5–15.5)
WBC: 4.6 10*3/uL (ref 4.0–10.5)

## 2015-04-19 LAB — HEPATIC FUNCTION PANEL
ALT: 21 U/L (ref 17–63)
AST: 45 U/L — ABNORMAL HIGH (ref 15–41)
Albumin: 3.7 g/dL (ref 3.5–5.0)
Alkaline Phosphatase: 84 U/L (ref 38–126)
BILIRUBIN DIRECT: 0.7 mg/dL — AB (ref 0.1–0.5)
BILIRUBIN INDIRECT: 1.3 mg/dL — AB (ref 0.3–0.9)
TOTAL PROTEIN: 8.1 g/dL (ref 6.5–8.1)
Total Bilirubin: 2 mg/dL — ABNORMAL HIGH (ref 0.3–1.2)

## 2015-04-19 LAB — MRSA PCR SCREENING: MRSA by PCR: NEGATIVE

## 2015-04-19 LAB — POCT ACTIVATED CLOTTING TIME
ACTIVATED CLOTTING TIME: 233 s
Activated Clotting Time: 190 seconds
Activated Clotting Time: 239 seconds

## 2015-04-19 LAB — GLUCOSE, CAPILLARY
Glucose-Capillary: 244 mg/dL — ABNORMAL HIGH (ref 65–99)
Glucose-Capillary: 317 mg/dL — ABNORMAL HIGH (ref 65–99)
Glucose-Capillary: 137 mg/dL — ABNORMAL HIGH (ref 65–99)

## 2015-04-19 LAB — TROPONIN I
TROPONIN I: 0.92 ng/mL — AB (ref <0.031)
TROPONIN I: 38.53 ng/mL — AB (ref <0.031)
Troponin I: 12.92 ng/mL (ref <0.031)

## 2015-04-19 LAB — I-STAT TROPONIN, ED: Troponin i, poc: 0.14 ng/mL (ref 0.00–0.08)

## 2015-04-19 LAB — LIPASE, BLOOD: LIPASE: 20 U/L (ref 11–51)

## 2015-04-19 LAB — CBG MONITORING, ED: Glucose-Capillary: 278 mg/dL — ABNORMAL HIGH (ref 65–99)

## 2015-04-19 SURGERY — LEFT HEART CATH AND CORONARY ANGIOGRAPHY

## 2015-04-19 MED ORDER — ONDANSETRON HCL 4 MG/2ML IJ SOLN: 4.0000 mg | Freq: Four times a day (QID) | INTRAMUSCULAR | Status: DC | PRN

## 2015-04-19 MED ORDER — NITROGLYCERIN 1 MG/10 ML FOR IR/CATH LAB
INTRA_ARTERIAL | Status: AC
  Filled 2015-04-19: qty 10

## 2015-04-19 MED ORDER — HEPARIN SODIUM (PORCINE) 1000 UNIT/ML IJ SOLN
INTRAMUSCULAR | Status: DC | PRN
  Administered 2015-04-19: 4000 [IU] via INTRAVENOUS
  Administered 2015-04-19: 3000 [IU] via INTRAVENOUS
  Administered 2015-04-19: 2000 [IU] via INTRAVENOUS

## 2015-04-19 MED ORDER — IOHEXOL 350 MG/ML SOLN
INTRAVENOUS | Status: DC | PRN
  Administered 2015-04-19 (×2): 100 mL via INTRAVENOUS

## 2015-04-19 MED ORDER — DOLUTEGRAVIR SODIUM 50 MG PO TABS
50.0000 mg | ORAL_TABLET | Freq: Every day | ORAL | Status: DC
  Administered 2015-04-19 – 2015-04-20 (×2): 50 mg via ORAL
  Filled 2015-04-19 (×2): qty 1

## 2015-04-19 MED ORDER — SODIUM CHLORIDE 0.9 % IJ SOLN: 3.0000 mL | INTRAMUSCULAR | Status: DC | PRN

## 2015-04-19 MED ORDER — NITROGLYCERIN IN D5W 200-5 MCG/ML-% IV SOLN: 0.0000 ug/min | INTRAVENOUS | Status: DC

## 2015-04-19 MED ORDER — LEVOTHYROXINE SODIUM 25 MCG PO TABS
225.0000 ug | ORAL_TABLET | Freq: Every day | ORAL | Status: DC
  Administered 2015-04-19 – 2015-04-20 (×2): 225 ug via ORAL
  Filled 2015-04-19 (×3): qty 1

## 2015-04-19 MED ORDER — NITROGLYCERIN IN D5W 200-5 MCG/ML-% IV SOLN
0.0000 ug/min | INTRAVENOUS | Status: DC
  Administered 2015-04-19: 5 ug/min via INTRAVENOUS
  Filled 2015-04-19: qty 250

## 2015-04-19 MED ORDER — NITROGLYCERIN 0.4 MG SL SUBL: 0.4000 mg | SUBLINGUAL_TABLET | SUBLINGUAL | Status: DC | PRN

## 2015-04-19 MED ORDER — ISOSORBIDE MONONITRATE 15 MG HALF TABLET
15.0000 mg | ORAL_TABLET | Freq: Every day | ORAL | Status: DC
  Administered 2015-04-20: 15 mg via ORAL
  Filled 2015-04-19: qty 1

## 2015-04-19 MED ORDER — INSULIN ASPART 100 UNIT/ML ~~LOC~~ SOLN
6.0000 [IU] | Freq: Three times a day (TID) | SUBCUTANEOUS | Status: DC
  Administered 2015-04-19 – 2015-04-20 (×2): 6 [IU] via SUBCUTANEOUS

## 2015-04-19 MED ORDER — HEPARIN BOLUS VIA INFUSION
4000.0000 [IU] | Freq: Once | INTRAVENOUS | Status: AC
  Administered 2015-04-19: 4000 [IU] via INTRAVENOUS
  Filled 2015-04-19: qty 4000

## 2015-04-19 MED ORDER — HEPARIN (PORCINE) IN NACL 2-0.9 UNIT/ML-% IJ SOLN
INTRAMUSCULAR | Status: AC
  Filled 2015-04-19: qty 1000

## 2015-04-19 MED ORDER — LIDOCAINE HCL (PF) 1 % IJ SOLN
INTRAMUSCULAR | Status: AC
  Filled 2015-04-19: qty 30

## 2015-04-19 MED ORDER — EMTRICITABINE-TENOFOVIR AF 200-25 MG PO TABS
1.0000 | ORAL_TABLET | Freq: Every day | ORAL | Status: DC
  Administered 2015-04-19 – 2015-04-20 (×2): 1 via ORAL
  Filled 2015-04-19 (×2): qty 1

## 2015-04-19 MED ORDER — CLOPIDOGREL BISULFATE 300 MG PO TABS
ORAL_TABLET | ORAL | Status: DC | PRN
Start: 2015-04-19 — End: 2015-04-19
  Administered 2015-04-19: 300 mg via ORAL

## 2015-04-19 MED ORDER — RITONAVIR 100 MG PO CAPS
100.0000 mg | ORAL_CAPSULE | Freq: Every day | ORAL | Status: DC
  Administered 2015-04-19 – 2015-04-20 (×2): 100 mg via ORAL
  Filled 2015-04-19 (×3): qty 1

## 2015-04-19 MED ORDER — LIDOCAINE HCL (PF) 1 % IJ SOLN
INTRAMUSCULAR | Status: DC | PRN
  Administered 2015-04-19: 2 mL

## 2015-04-19 MED ORDER — CITALOPRAM HYDROBROMIDE 10 MG PO TABS
10.0000 mg | ORAL_TABLET | Freq: Every day | ORAL | Status: DC
  Administered 2015-04-20: 10 mg via ORAL
  Filled 2015-04-19 (×2): qty 1

## 2015-04-19 MED ORDER — GABAPENTIN 300 MG PO CAPS
300.0000 mg | ORAL_CAPSULE | Freq: Two times a day (BID) | ORAL | Status: DC
  Administered 2015-04-19 – 2015-04-20 (×2): 300 mg via ORAL
  Filled 2015-04-19 (×2): qty 1

## 2015-04-19 MED ORDER — VERAPAMIL HCL 2.5 MG/ML IV SOLN
INTRA_ARTERIAL | Status: DC | PRN
  Administered 2015-04-19: 20 mL via INTRA_ARTERIAL

## 2015-04-19 MED ORDER — PANTOPRAZOLE SODIUM 40 MG PO TBEC
40.0000 mg | DELAYED_RELEASE_TABLET | Freq: Every day | ORAL | Status: DC
  Administered 2015-04-20: 40 mg via ORAL
  Filled 2015-04-19: qty 1

## 2015-04-19 MED ORDER — VERAPAMIL HCL 2.5 MG/ML IV SOLN
INTRAVENOUS | Status: DC | PRN
  Administered 2015-04-19 (×2): 200 ug via INTRACORONARY

## 2015-04-19 MED ORDER — METOPROLOL SUCCINATE ER 25 MG PO TB24
25.0000 mg | ORAL_TABLET | Freq: Every day | ORAL | Status: DC
  Administered 2015-04-20: 25 mg via ORAL
  Filled 2015-04-19: qty 1

## 2015-04-19 MED ORDER — FENTANYL CITRATE (PF) 100 MCG/2ML IJ SOLN
INTRAMUSCULAR | Status: DC | PRN
  Administered 2015-04-19: 25 ug via INTRAVENOUS

## 2015-04-19 MED ORDER — FENTANYL CITRATE (PF) 100 MCG/2ML IJ SOLN
INTRAMUSCULAR | Status: AC
  Filled 2015-04-19: qty 2

## 2015-04-19 MED ORDER — ASPIRIN EC 81 MG PO TBEC
81.0000 mg | DELAYED_RELEASE_TABLET | Freq: Every day | ORAL | Status: DC
  Administered 2015-04-20: 81 mg via ORAL
  Filled 2015-04-19: qty 1

## 2015-04-19 MED ORDER — HEPARIN (PORCINE) IN NACL 100-0.45 UNIT/ML-% IJ SOLN
900.0000 [IU]/h | INTRAMUSCULAR | Status: DC
  Administered 2015-04-19: 900 [IU]/h via INTRAVENOUS
  Filled 2015-04-19: qty 250

## 2015-04-19 MED ORDER — SODIUM CHLORIDE 0.9 % WEIGHT BASED INFUSION: 3.0000 mL/kg/h | INTRAVENOUS | Status: AC

## 2015-04-19 MED ORDER — VERAPAMIL HCL 2.5 MG/ML IV SOLN
INTRAVENOUS | Status: AC
  Filled 2015-04-19: qty 2

## 2015-04-19 MED ORDER — SODIUM CHLORIDE 0.9 % IJ SOLN
3.0000 mL | Freq: Two times a day (BID) | INTRAMUSCULAR | Status: DC
  Administered 2015-04-19 – 2015-04-20 (×3): 3 mL via INTRAVENOUS

## 2015-04-19 MED ORDER — SODIUM CHLORIDE 0.9 % IJ SOLN
3.0000 mL | Freq: Two times a day (BID) | INTRAMUSCULAR | Status: DC
  Administered 2015-04-19 – 2015-04-20 (×2): 3 mL via INTRAVENOUS

## 2015-04-19 MED ORDER — ATORVASTATIN CALCIUM 20 MG PO TABS
20.0000 mg | ORAL_TABLET | Freq: Every day | ORAL | Status: DC
  Administered 2015-04-20: 20 mg via ORAL
  Filled 2015-04-19: qty 1

## 2015-04-19 MED ORDER — CLOPIDOGREL BISULFATE 75 MG PO TABS
75.0000 mg | ORAL_TABLET | Freq: Every day | ORAL | Status: DC
  Administered 2015-04-20: 75 mg via ORAL
  Filled 2015-04-19: qty 1

## 2015-04-19 MED ORDER — METFORMIN HCL ER 500 MG PO TB24: 500.0000 mg | ORAL_TABLET | Freq: Two times a day (BID) | ORAL | Status: DC

## 2015-04-19 MED ORDER — CLOPIDOGREL BISULFATE 300 MG PO TABS
ORAL_TABLET | ORAL | Status: AC
  Filled 2015-04-19: qty 1

## 2015-04-19 MED ORDER — MIDAZOLAM HCL 2 MG/2ML IJ SOLN
INTRAMUSCULAR | Status: AC
  Filled 2015-04-19: qty 2

## 2015-04-19 MED ORDER — INSULIN ASPART 100 UNIT/ML ~~LOC~~ SOLN
0.0000 [IU] | Freq: Three times a day (TID) | SUBCUTANEOUS | Status: DC
  Administered 2015-04-19: 11 [IU] via SUBCUTANEOUS
  Administered 2015-04-20: 3 [IU] via SUBCUTANEOUS

## 2015-04-19 MED ORDER — SODIUM CHLORIDE 0.9 % IV SOLN
INTRAVENOUS | Status: DC
  Administered 2015-04-19: 18:00:00 via INTRAVENOUS

## 2015-04-19 MED ORDER — ACETAMINOPHEN 325 MG PO TABS: 650.0000 mg | ORAL_TABLET | ORAL | Status: DC | PRN

## 2015-04-19 MED ORDER — HEPARIN SODIUM (PORCINE) 1000 UNIT/ML IJ SOLN
INTRAMUSCULAR | Status: AC
  Filled 2015-04-19: qty 1

## 2015-04-19 MED ORDER — SODIUM CHLORIDE 0.9 % IV SOLN: 250.0000 mL | INTRAVENOUS | Status: DC | PRN

## 2015-04-19 MED ORDER — ASPIRIN 81 MG PO CHEW: 81.0000 mg | CHEWABLE_TABLET | ORAL | Status: AC

## 2015-04-19 MED ORDER — LIDOCAINE HCL (PF) 1 % IJ SOLN
INTRAMUSCULAR | Status: DC | PRN
  Administered 2015-04-19: 13:00:00

## 2015-04-19 MED ORDER — DARUNAVIR ETHANOLATE 800 MG PO TABS
800.0000 mg | ORAL_TABLET | Freq: Every day | ORAL | Status: DC
  Administered 2015-04-19 – 2015-04-20 (×2): 800 mg via ORAL
  Filled 2015-04-19 (×3): qty 1

## 2015-04-19 MED ORDER — MIDAZOLAM HCL 2 MG/2ML IJ SOLN
INTRAMUSCULAR | Status: DC | PRN
  Administered 2015-04-19: 1 mg via INTRAVENOUS

## 2015-04-19 MED ORDER — SODIUM CHLORIDE 0.9 % IV SOLN
INTRAVENOUS | Status: DC | PRN
  Administered 2015-04-19: 73 mL via INTRAVENOUS

## 2015-04-19 MED ORDER — VERAPAMIL HCL 2.5 MG/ML IV SOLN
INTRAVENOUS | Status: DC | PRN
  Administered 2015-04-19: 12:00:00 via INTRA_ARTERIAL

## 2015-04-19 SURGICAL SUPPLY — 19 items
BALLN EMERGE MR 2.5X15 (BALLOONS) ×3
BALLN ~~LOC~~ TREK RX 4.0X15 (BALLOONS) ×3
BALLOON EMERGE MR 2.5X15 (BALLOONS) ×1 IMPLANT
BALLOON ~~LOC~~ TREK RX 4.0X15 (BALLOONS) IMPLANT
CATH INFINITI 5 FR IM (CATHETERS) ×2 IMPLANT
CATH INFINITI 5FR ANG PIGTAIL (CATHETERS) ×3 IMPLANT
CATH OPTITORQUE TIG 4.0 5F (CATHETERS) ×3 IMPLANT
CATH VISTA GUIDE 6FR AL1 (CATHETERS) ×2 IMPLANT
DEVICE RAD COMP TR BAND LRG (VASCULAR PRODUCTS) ×3 IMPLANT
ELECT DEFIB PAD ADLT CADENCE (PAD) ×2 IMPLANT
GLIDESHEATH SLEND A-KIT 6F 22G (SHEATH) ×3 IMPLANT
KIT ENCORE 26 ADVANTAGE (KITS) ×2 IMPLANT
KIT HEART LEFT (KITS) ×3 IMPLANT
PACK CARDIAC CATHETERIZATION (CUSTOM PROCEDURE TRAY) ×3 IMPLANT
STENT PROMUS PREM MR 3.5X24 (Permanent Stent) ×2 IMPLANT
TRANSDUCER W/STOPCOCK (MISCELLANEOUS) ×3 IMPLANT
TUBING CIL FLEX 10 FLL-RA (TUBING) ×3 IMPLANT
WIRE HI TORQ BMW 190CM (WIRE) ×2 IMPLANT
WIRE SAFE-T 1.5MM-J .035X260CM (WIRE) ×3 IMPLANT

## 2015-04-19 NOTE — ED Notes (Signed)
Pt returned from CT without scan due to increased chest pain.  RN in room upon pt arrival and Ray MD made aware

## 2015-04-19 NOTE — Interval H&P Note (Signed)
History and Physical Interval Note:  04/19/2015 11:26 AM  [facility]  has presented today for surgery, with the diagnosis of NSTEMI, known CAD-CABG.  The various methods of treatment have been discussed with the patient and family. After consideration of risks, benefits and other options for treatment, the patient has consented to  Procedure(s): Left Heart Cath and Coronary Angiography (N/A) With Possible Percutaneous Coronary Intervention as a surgical intervention .  The patient's history has been reviewed, patient examined, no change in status, stable for surgery.  I have reviewed the patient's chart and labs.  Questions were answered to the patient's satisfaction.    Cath Lab Visit (complete for each Cath Lab visit)  Clinical Evaluation Leading to the Procedure:   ACS: Yes.    Non-ACS:    Anginal Classification: CCS IV  Anti-ischemic medical therapy: Maximal Therapy (2 or more classes of medications)  Non-Invasive Test Results: No non-invasive testing performed  Prior CABG: Previous CABG   TIMI Score  Patient Information:  TIMI Score is 5  Revascularization of the presumed culprit artery  A (9)  Indication: 11; Score: 9 TIMI Score  Patient Information:  TIMI Score is 5  Revascularization of multiple coronary arteries when the culprit artery cannot clearly be determined  A (9)  Indication: 12; Score: 9   HARDING, Leonie Green, M.D., M.S. Interventional Cardiologist   Pager # [phone]        [facility], [place]

## 2015-04-19 NOTE — Progress Notes (Signed)
  Echocardiogram 2D Echocardiogram has been performed.  Johny Chess 04/19/2015, 5:12 PM

## 2015-04-19 NOTE — Progress Notes (Signed)
ANTICOAGULATION CONSULT NOTE - Initial Consult  Pharmacy Consult for heparin Indication: chest pain/ACS  Allergies  Allergen Reactions  . Penicillins Anaphylaxis  . Codeine Nausea Only    Patient Measurements: Height: 5\' 8"  (172.7 cm) Weight: 162 lb (73.483 kg) IBW/kg (Calculated) : 68.4  Vital Signs: Temp: 97.9 F (36.6 C) (11/23 0846) Temp Source: Oral (11/23 0846) BP: 151/107 mmHg (11/23 0900) Pulse Rate: 91 (11/23 0900)  Labs:  Recent Labs  04/19/15 0859  HGB 13.5  HCT 40.6  PLT 179    CrCl cannot be calculated (Patient has no serum creatinine result on file.).   Medical History: Past Medical History  Diagnosis Date  . Diabetes mellitus without complication ([place])   . Coronary artery disease   . CHF (congestive heart failure) ([place])   . Hyperlipidemia   . Hypertension   . Stroke ([place])   . Syncope and collapse   . HIV (human immunodeficiency virus infection) ([place])   . Cancer ([place])   . Diabetic neuropathy ([place]) 12/22/2014    Assessment: 59 yo m presenting to the ED on 11/23 with CP.  Pharmacy is consulted to begin heparin for ACS.  No AC PTA. Hgb 13.5, plts 179.   Goal of Therapy:  Heparin level 0.3-0.7 units/ml Monitor platelets by anticoagulation protocol: Yes   Plan:  Heparin 4,000 unit bolus x 1 Heparin infusion at 900 units/hr 6-hr HL @ 1530 Daily HL and CBC Monitor for s/sx of bleeding  Mariaelena Cade L. Nicole Kindred, PharmD Clinical Pharmacy Resident Pager: [phone] 04/19/2015 9:24 AM

## 2015-04-19 NOTE — ED Notes (Signed)
Pt reports he began having a throbbing in his left chest that began this morning at 0530.  Pt reports mild SOB and 1 episode of vomiting this morning.  Pt family also reports that when they arrived at pt home this morning they noticed an increase in facial droop on the right with increased right sided weakness.  Pt has a hx of stroke with right sided deficits aprox. 3 weeks ago.

## 2015-04-19 NOTE — Progress Notes (Signed)
Patient's friend in; Dr. Ellyn Hack talking w/patient.

## 2015-04-19 NOTE — ED Notes (Signed)
Oval Linsey MD at bedside

## 2015-04-19 NOTE — Progress Notes (Signed)
PHARMACIST - PHYSICIAN COMMUNICATION DR:  Oval Linsey CONCERNING:  METFORMIN SAFE ADMINISTRATION POLICY  RECOMMENDATION: Metformin has been placed on DISCONTINUE (rejected order) STATUS and should be reordered only after any of the conditions below are ruled out.  Current safety recommendations include avoiding metformin for a minimum of 48 hours after the patient's exposure to intravenous contrast media.  DESCRIPTION:  The Pharmacy Committee has adopted a policy that restricts the use of metformin in hospitalized patients until all the following conditions have been ruled out.  Specific contraindications are:  _0  eGFR is below 30 ml/minute _1  Planned administration of intravenous iodinated contrast media _2  Acute or chronic metabolic acidosis (including DKA)  _3  Shock, acute MI, sepsis, hypoxemia, dehydration _4  Heart Failure patients with low EF       Eudelia Bunch, [facility] 04/19/2015 3:25 PM

## 2015-04-19 NOTE — Progress Notes (Signed)
"  Feeling bad"

## 2015-04-19 NOTE — Progress Notes (Signed)
Pt just back from the cath lab. Discussed ed with pt and family since CR is closed tomorrow. Voiced understanding and requests his referral be sent to [place]. Discussed importance of Plavix. [place], ACSM 3:59 PM 04/19/2015

## 2015-04-19 NOTE — H&P (View-Only) (Signed)
Patient ID: Jackson Shaw MRN: [id number], DOB/AGE: [birth year]   Admit date: 04/19/2015   Primary Physician: No primary care provider on file. Primary Cardiologist: Dr. Debara Pickett, Dr. Sallyanne Kuster  Pt. Profile:  59 y/o HIV positive male with CAD s/p CABG in 2012, IDDM, Ischemic cardiomyopathy with EF of 35%, h/o recent left midbrain stroke and HLD presenting with chest pain and abnormal troponin.   Problem List  Past Medical History  Diagnosis Date  . Diabetes mellitus without complication ([facility])   . Coronary artery disease   . CHF (congestive heart failure) ([place])   . Hyperlipidemia   . Hypertension   . Stroke ([facility])   . Syncope and collapse   . HIV (human immunodeficiency virus infection) ([facility])   . Cancer ([facility])   . Diabetic neuropathy ([facility]) 12/22/2014    Past Surgical History  Procedure Laterality Date  . Loop recorder implant      Medtronic Reveal Model # [id number] MR safe     Allergies  Allergies  Allergen Reactions  . Penicillins Anaphylaxis  . Codeine Nausea Only    HPI  Jackson Shaw is a complex 59 year old male who is a former patient of [facility]. He has a long-standing history of HIV disease dating back to the 38s. He's been on a number of antiviral regimens and fortunately has survived secondary to this, however has suffered a number of consequences from his long-term disease and medical therapy. Other comorbidities include significant coronary artery disease and he status post 5 vessel CABG in 2012 by Dr. Modesto Charon (Coronary artery bypass grafting x 5 (left internal mammary artery to left anterior descending, sequential saphenous vein graft to diagonal 1 and obtuse marginal 1, sequential saphenous vein graft to posterior descending and Posterolateral). This is following non-ST elevation MI, the fourth that he has had. He also reports having had 2 prior strokes. In addition he is an insulin-dependent diabetes with abnormal cholesterol and hypertension. He's  not currently on any antihypertensive or heart failure/coronary artery disease medications due to syncope. He reports last summer he was having chest pain and had a syncopal event. He underwent heart catheterization which he reports was performed at [facility] by cardiologist with [facility]. The results are as follows:  Left heart catheterization (01/20/14) revealed:   Left Ventriculography: 35% with anterior dk and inferior hk  Coronary Angiography: Left Main: 100 LAD: 100 prox. LCX: 99 prox RCA: 100 prox SVG to pda has a 50% prox lesion. It ties into a normal pda and backfillsa a small pl Shaw through a severe lesion. This is unchanged from prior cath SVG to ramus: There are patent prox and mid stents. This graft had been a jump graft to diag and ramus but the diag is subtotally occluded at the anastamosis (unchanged from prior). The ramus is large LIMA to lad ties into mid lad. There is a 50 in the lad after the touchdown  Impression: Severe 3 v cad Moderately decreased lv ef 3/4 grafts patent with widely patent stents in the svg to ramus No change from prior cath  He subsequently had placement of a Medtronic Linq loop recorder. No clear etiology of his syncope has been found on loop. He was last seen by Dr. Debara Pickett in 10/2014 and complained of chest discomfort. He had previously been on Imdur but this was discontinued due to concerns for orthostatic syncope. Dr. Debara Pickett commented in his note that Ranexa could not be used due to interaction  with his protease inhibitors. His BP was a bit elevated in the office that day, thus Dr. Debara Pickett opted to place him on low dose isosorbide. He also ordered for him to undergo a NST to reassess for ischemia, however the patient failed to get study done.   He now presents to the [facility] ED with a complaint of left sided chest pressure, similar to his prior angina. Sudden onset, which started around 5 AM. His partner is present  and notes there was a heat argument at their home last night and believes that the emotional stress may have precipitated his symptoms. He is currently with ongoing CP. IV heparin and IV nitro initiated. He denies any other symptoms. No syncope. VSS.  Initial troponin is abnormal at 0.14. EKG shows ST depressions in inferolateral leads, not seen on prior EKGs.  He reports full medication compliance. Denies tobacco abuse.   Note: He does mention that he was recently treated at [facility] 3 weeks ago for CVA. Per records in [facility], he presented with complaints of progressive right sided upper and lower extyremity numbness and weakness. MRI of brain revealed MRI of brain revealed acute lacunar infarct in left midbrain without hemorrhage or mass effect.  According to his partner, this was present on imaging study in June of this year but initially missed and identified on recent study. He required inpatient rehab but was released 2 weeks ago.     Home Medications  Prior to Admission medications   Medication Sig Start Date End Date Taking? Authorizing Provider  aspirin EC 81 MG tablet Take 1 tablet by mouth daily. 08/12/12   Historical Provider, MD  atorvastatin (LIPITOR) 20 MG tablet Take 1 tablet by mouth daily. 06/15/13   Historical Provider, MD  citalopram (CELEXA) 10 MG tablet Take 1 tablet by mouth daily.    Historical Provider, MD  clopidogrel (PLAVIX) 75 MG tablet Take 1 tablet by mouth daily. 06/15/13   Historical Provider, MD  Darunavir Ethanolate (PREZISTA) 800 MG tablet Take 1 tablet by mouth daily.    Historical Provider, MD  dolutegravir (TIVICAY) 50 MG tablet Take 1 tablet by mouth daily.    Historical Provider, MD  emtricitabine-tenofovir (TRUVADA) 200-300 MG per tablet Take 1 tablet by mouth daily.    Historical Provider, MD  gabapentin (NEURONTIN) 300 MG capsule One capsule at night for 2 weeks, then take one capsule twice a day 12/22/14   Kathrynn Ducking, MD    insulin aspart (NOVOLOG) 100 UNIT/ML injection Inject 6 Units into the skin 3 (three) times daily with meals. 6 units is the base. Add 2 units for each 15gm of carbs    Historical Provider, MD  isosorbide mononitrate (IMDUR) 30 MG 24 hr tablet Take 0.5 tablets (15 mg total) by mouth daily. 11/07/14   Pixie Casino, MD  levothyroxine (SYNTHROID, LEVOTHROID) 75 MCG tablet Take 225 mcg by mouth daily.    Historical Provider, MD  metFORMIN (GLUCOPHAGE-XR) 500 MG 24 hr tablet Take 1 tablet by mouth 2 (two) times daily.    Historical Provider, MD  metoprolol succinate (TOPROL XL) 25 MG 24 hr tablet Take 1 tablet (25 mg total) by mouth daily. 08/08/14   Pixie Casino, MD  pantoprazole (PROTONIX) 40 MG tablet Take 1 tablet by mouth daily.    Historical Provider, MD  ritonavir (NORVIR) 100 MG capsule Take 1 capsule by mouth daily.    Historical Provider, MD    Family History  Family History  Problem Relation Age of Onset  . Heart disease Mother   . Diabetes Mother   . Cancer Mother   . Heart attack Father   . Stroke Sister     ICH  . Diabetes Sister     Social History  Social History   Social History  . Marital Status: Single    Spouse Name: N/A  . Number of Children: 0  . Years of Education: MA   Occupational History  . Retired    Social History Main Topics  . Smoking status: Never Smoker   . Smokeless tobacco: Never Used  . Alcohol Use: 0.0 oz/week    0 Standard drinks or equivalent per week     Comment: 6 per month  . Drug Use: Not on file  . Sexual Activity: Not on file   Other Topics Concern  . Not on file   Social History Narrative   Patient lives at home with partner.   Caffeine use: 3 cups daily     Review of Systems General:  No chills, fever, night sweats or weight changes.  Cardiovascular:  No chest pain, dyspnea on exertion, edema, orthopnea, palpitations, paroxysmal nocturnal dyspnea. Dermatological: No rash, lesions/masses Respiratory: No cough,  dyspnea Urologic: No hematuria, dysuria Abdominal:   No nausea, vomiting, diarrhea, bright Jackson blood per rectum, melena, or hematemesis Neurologic:  No visual changes, wkns, changes in mental status. All other systems reviewed and are otherwise negative except as noted above.  Physical Exam  Blood pressure 151/107, pulse 91, temperature 97.9 F (36.6 C), temperature source Oral, resp. rate 27, height 5\' 8"  (1.727 m), weight 162 lb (73.483 kg), SpO2 95 %.  General: Pleasant, NAD Psych: Normal affect. Neuro: Alert and oriented X 3. Moves all extremities spontaneously. HEENT: Normal  Neck: Supple without bruits or JVD. Lungs:  Resp regular and unlabored, CTA. Heart: RRR no s3, s4, or murmurs. Abdomen: Soft, non-tender, non-distended, BS + x 4.  Extremities: No clubbing, cyanosis or edema. DP/PT/Radials 2+ and equal bilaterally.  Labs  Troponin [facility] of Care Test)  Recent Labs  04/19/15 0903  TROPIPOC 0.14*   No results for input(s): CKTOTAL, CKMB, TROPONINI in the last 72 hours. Lab Results  Component Value Date   WBC 4.6 04/19/2015   HGB 13.5 04/19/2015   HCT 40.6 04/19/2015   MCV 87.9 04/19/2015   PLT 179 04/19/2015   No results for input(s): NA, K, CL, CO2, BUN, CREATININE, CALCIUM, PROT, BILITOT, ALKPHOS, ALT, AST, GLUCOSE in the last 168 hours.  Invalid input(s): LABALBU Lab Results  Component Value Date   CHOL 234* 03/05/2010   HDL 37.60* 03/05/2010   TRIG 93.0 03/05/2010   No results found for: DDIMER   Radiology/Studies  No results found.  ECG  NSR. New ST depressions in the inferolateral leads   ASSESSMENT AND PLAN  1. Chest Pain: symptoms are concerning for unstable angina. Initial POC troponin is abnormal at 0.14. EKG with ST depressions in the inferolateral leads. He has known CAD s/p CABG in 2012 by Dr. Roxan Hockey. Most recent LHC in 2015 showed 3/4 patent grafts with 50% prox lesion in the SVG-PDA. Will likely need repeat cath. Will try to  arrange today. MD to assess.   2. H/O CVA: recent MRI of brain revealed acute lacunar infarct in left midbrain without hemorrhage or mass effect discovered at [facility]. Per patient, this likely occurred in June but was initially missed on imaging study and noticed 2 weeks ago when he presented back  with worsening right upper and lower extremity numbness and weakness. He is followed in [place].   3. HIV: Management per IM.    4. HLD: continue Lipitor. Check FLP in the am.   5. DM: Management per IM.  6. CHF: Reported EF of 35%. Volume stable. Hasn't been on many HF medications in the past due to h/o syncope. Repeat 2D echo.   7. HTN: BP is elevated at 1162/104. Order home metoprolol.    Signed, Lyda Jester, PA-C 04/19/2015, 9:28 AM

## 2015-04-19 NOTE — ED Notes (Signed)
Pt CBG, 278. Nurse was notified.

## 2015-04-19 NOTE — ED Notes (Signed)
Pt placed on 3 L O2 [place].

## 2015-04-19 NOTE — ED Provider Notes (Addendum)
CSN: [id number]     Arrival date & time 04/19/15  [id number] History   First MD Initiated Contact with Patient 04/19/15 [phone]     Chief Complaint  Patient presents with  . Chest Pain  . Stroke Symptoms     (Consider location/radiation/quality/duration/timing/severity/associated sxs/prior Treatment) Patient is a 59 y.o. male presenting with chest pain.  Chest Pain  59 year old male complex medical history including long-standing HIV disease multiple MIs, and diabetes with history of coronary artery disease status post coronary artery bypass grafting presents today complaining of chest pain that began at 5:30 this morning. He has some history of esophageal disease and initially felt that this was consistent with his prior esophageal pain. He took medications for this with some relief of the central pain but continued left anterior chest pain. He identifies his pain as consistent with his prior coronary artery disease. This pain was initially a 10 out of 10 and has decreased spontaneously to 5 out of 10. He took his aspirin at home. He states he had pain in his left arm. He had one episode of vomiting. He was recently discharged [facility] after a stroke. He has residual right-sided weakness secondary to this. His friend noted some left facial drooping of the mouth this a.m. that is new. He has not noted any other new neurological symptoms. Past Medical History  Diagnosis Date  . Diabetes mellitus without complication ([place])   . Coronary artery disease   . CHF (congestive heart failure) ([place])   . Hyperlipidemia   . Hypertension   . Stroke ([place])   . Syncope and collapse   . HIV (human immunodeficiency virus infection) ([place])   . Cancer ([place])   . Diabetic neuropathy ([place]) 12/22/2014   Past Surgical History  Procedure Laterality Date  . Loop recorder implant      Medtronic Reveal Model # [id number] MR safe   Family History  Problem Relation Age of Onset  . Heart disease Mother   . Diabetes  Mother   . Cancer Mother   . Heart attack Father   . Stroke Sister     ICH  . Diabetes Sister    Social History  Substance Use Topics  . Smoking status: Never Smoker   . Smokeless tobacco: Never Used  . Alcohol Use: 0.0 oz/week    0 Standard drinks or equivalent per week     Comment: 6 per month    Review of Systems  Cardiovascular: Positive for chest pain.  All other systems reviewed and are negative.     Allergies  Penicillins and Codeine  Home Medications   Prior to Admission medications   Medication Sig Start Date End Date Taking? Authorizing Provider  aspirin EC 81 MG tablet Take 1 tablet by mouth daily. 08/12/12   Historical Provider, MD  atorvastatin (LIPITOR) 20 MG tablet Take 1 tablet by mouth daily. 06/15/13   Historical Provider, MD  citalopram (CELEXA) 10 MG tablet Take 1 tablet by mouth daily.    Historical Provider, MD  clopidogrel (PLAVIX) 75 MG tablet Take 1 tablet by mouth daily. 06/15/13   Historical Provider, MD  Darunavir Ethanolate (PREZISTA) 800 MG tablet Take 1 tablet by mouth daily.    Historical Provider, MD  dolutegravir (TIVICAY) 50 MG tablet Take 1 tablet by mouth daily.    Historical Provider, MD  emtricitabine-tenofovir (TRUVADA) 200-300 MG per tablet Take 1 tablet by mouth daily.    Historical Provider, MD  gabapentin (NEURONTIN) 300 MG capsule One  capsule at night for 2 weeks, then take one capsule twice a day 12/22/14   Kathrynn Ducking, MD  insulin aspart (NOVOLOG) 100 UNIT/ML injection Inject 6 Units into the skin 3 (three) times daily with meals. 6 units is the base. Add 2 units for each 15gm of carbs    Historical Provider, MD  isosorbide mononitrate (IMDUR) 30 MG 24 hr tablet Take 0.5 tablets (15 mg total) by mouth daily. 11/07/14   Pixie Casino, MD  levothyroxine (SYNTHROID, LEVOTHROID) 75 MCG tablet Take 225 mcg by mouth daily.    Historical Provider, MD  metFORMIN (GLUCOPHAGE-XR) 500 MG 24 hr tablet Take 1 tablet by mouth 2 (two) times  daily.    Historical Provider, MD  metoprolol succinate (TOPROL XL) 25 MG 24 hr tablet Take 1 tablet (25 mg total) by mouth daily. 08/08/14   Pixie Casino, MD  pantoprazole (PROTONIX) 40 MG tablet Take 1 tablet by mouth daily.    Historical Provider, MD  ritonavir (NORVIR) 100 MG capsule Take 1 capsule by mouth daily.    Historical Provider, MD   BP 151/107 mmHg  Pulse 91  Temp(Src) 97.9 F (36.6 C) (Oral)  Resp 27  Ht 5\' 8"  (1.727 m)  Wt 73.483 kg  BMI 24.64 kg/m2  SpO2 95% Physical Exam  Constitutional: He is oriented to person, place, and time. He appears well-developed and well-nourished. No distress.  HENT:  Head: Normocephalic and atraumatic.  Right Ear: External ear normal.  Left Ear: External ear normal.  Nose: Nose normal.  Mouth/Throat: Oropharynx is clear and moist.  Eyes: Conjunctivae are normal. Pupils are equal, round, and reactive to light.  Neck: Normal range of motion. Neck supple.  Cardiovascular: Normal rate and regular rhythm.   Pulmonary/Chest: Effort normal.  Abdominal: Soft. Bowel sounds are normal.  Musculoskeletal: Normal range of motion. He exhibits no edema or tenderness.  Neurological: He is alert and oriented to person, place, and time.  Right upper extremity strength 4 out of 5 right lower extremity strength 4 out of 5 there is some drooping noted of the left side of the mouth but it appears to move normally. Eyebrows raise equally. Extraocular movements are intact.  Skin: Skin is warm and dry.  Psychiatric: He has a normal mood and affect. His behavior is normal. Judgment and thought content normal.  Nursing note and vitals reviewed.   ED Course  Procedures (including critical care time) [place], ED - Abnormal; Notable for the following:    Troponin i, poc 0.14 (*)    All other components within normal limits  CBG MONITORING, ED - Abnormal; Notable for the following:    Glucose-Capillary 278 (*)    All  other components within normal limits  CBC  BASIC METABOLIC PANEL  HEPATIC FUNCTION PANEL  LIPASE, BLOOD    Imaging Review No results found. I have personally reviewed and evaluated these images and lab results as part of my medical decision-making.   EKG Interpretation   Date/Time:  Wednesday April 19 2015 08:42:45 EST Ventricular Rate:  95 PR Interval:  196 QRS Duration: 119 QT Interval:  391 QTC Calculation: 491 R Axis:   33 Text Interpretation:  Normal sinus rhythm Non-specific ST-t changes  Inferolateral leads Confirmed by Kodee Drury MD, Andee Poles [id number]) on 04/19/2015  8:46:30 AM      MDM   Final diagnoses:  NSTEMI (non-ST elevated myocardial infarction) [facility])   59 year old male couple K past medical history  and known coronary artery disease who presents today with chest pain that began at 5:30 AM. Pain has decreased from 10-5 out of 10. No specific cardiac interventions preceded these changes. Here his EKG shows diffuse nonspecific ST changes. Initial troponin is elevated at 0.14. IV nitroglycerin and heparin are being instituted. I have spoken with cardiology and they will be in to see and evaluate directly.  Pattricia Boss, MD 04/19/15 [id number]  Pattricia Boss, MD 04/19/15 [phone]

## 2015-04-19 NOTE — Consult Note (Signed)
Patient ID: Jackson Shaw MRN: [id number], DOB/AGE: [birth year]   Admit date: 04/19/2015   Primary Physician: No primary care provider on file. Primary Cardiologist: Dr. Debara Shaw, Dr. Sallyanne Shaw  Pt. Profile:  59 y/o HIV positive male with CAD s/p CABG in 2012, IDDM, Ischemic cardiomyopathy with EF of 35%, h/o recent left midbrain stroke and HLD presenting with chest pain and abnormal troponin.   Problem List  Past Medical History  Diagnosis Date  . Diabetes mellitus without complication ([place])   . Coronary artery disease   . CHF (congestive heart failure) ([place])   . Hyperlipidemia   . Hypertension   . Stroke ([place])   . Syncope and collapse   . HIV (human immunodeficiency virus infection) ([place])   . Cancer ([place])   . Diabetic neuropathy ([place]) 12/22/2014    Past Surgical History  Procedure Laterality Date  . Loop recorder implant      Medtronic Reveal Model # [id number] MR safe     Allergies  Allergies  Allergen Reactions  . Penicillins Anaphylaxis  . Codeine Nausea Only    HPI  Jackson Shaw is a complex 59 year old male who is a former patient of Dr. Addison Shaw. He has a long-standing history of HIV disease dating back to the 16s. He's been on a number of antiviral regimens and fortunately has survived secondary to this, however has suffered a number of consequences from his long-term disease and medical therapy. Other comorbidities include significant coronary artery disease and he status post 5 vessel CABG in 2012 by Dr. Modesto Shaw (Coronary artery bypass grafting x 5 (left internal mammary artery to left anterior descending, sequential saphenous vein graft to diagonal 1 and obtuse marginal 1, sequential saphenous vein graft to posterior descending and Posterolateral). This is following non-ST elevation MI, the fourth that he has had. He also reports having had 2 prior strokes. In addition he is an insulin-dependent diabetes with abnormal cholesterol and hypertension. He's  not currently on any antihypertensive or heart failure/coronary artery disease medications due to syncope. He reports last summer he was having chest pain and had a syncopal event. He underwent heart catheterization which he reports was performed at [facility] by cardiologist with [facility]. The results are as follows:  Left heart catheterization (01/20/14) revealed:   Left Ventriculography: 35% with anterior dk and inferior hk  Coronary Angiography: Left Main: 100 LAD: 100 prox. LCX: 99 prox RCA: 100 prox SVG to pda has a 50% prox lesion. It ties into a normal pda and backfillsa a small pl system through a severe lesion. This is unchanged from prior cath SVG to ramus: There are patent prox and mid stents. This graft had been a jump graft to diag and ramus but the diag is subtotally occluded at the anastamosis (unchanged from prior). The ramus is large LIMA to lad ties into mid lad. There is a 50 in the lad after the touchdown  Impression: Severe 3 v cad Moderately decreased lv ef 3/4 grafts patent with widely patent stents in the svg to ramus No change from prior cath  He subsequently had placement of a Medtronic Linq loop recorder. No clear etiology of his syncope has been found on loop. He was last seen by Dr. Debara Shaw in 10/2014 and complained of chest discomfort. He had previously been on Imdur but this was discontinued due to concerns for orthostatic syncope. Dr. Debara Shaw commented in his note that Ranexa could not be used due to interaction  with his protease inhibitors. His BP was a bit elevated in the office that day, thus Dr. Debara Shaw opted to place him on low dose isosorbide. He also ordered for him to undergo a NST to reassess for ischemia, however the patient failed to get study done.   He now presents to the [facility] ED with a complaint of left sided chest pressure, similar to his prior angina. Sudden onset, which started around 5 AM. His partner is present  and notes there was a heat argument at their home last night and believes that the emotional stress may have precipitated his symptoms. He is currently with ongoing CP. IV heparin and IV nitro initiated. He denies any other symptoms. No syncope. VSS.  Initial troponin is abnormal at 0.14. EKG shows ST depressions in inferolateral leads, not seen on prior EKGs.  He reports full medication compliance. Denies tobacco abuse.   Note: He does mention that he was recently treated at [facility] 3 weeks ago for CVA. Per records in [place], he presented with complaints of progressive right sided upper and lower extyremity numbness and weakness. MRI of brain revealed MRI of brain revealed acute lacunar infarct in left midbrain without hemorrhage or mass effect.  According to his partner, this was present on imaging study in June of this year but initially missed and identified on recent study. He required inpatient rehab but was released 2 weeks ago.     Home Medications  Prior to Admission medications   Medication Sig Start Date End Date Taking? Authorizing Provider  aspirin EC 81 MG tablet Take 1 tablet by mouth daily. 08/12/12   Historical Provider, MD  atorvastatin (LIPITOR) 20 MG tablet Take 1 tablet by mouth daily. 06/15/13   Historical Provider, MD  citalopram (CELEXA) 10 MG tablet Take 1 tablet by mouth daily.    Historical Provider, MD  clopidogrel (PLAVIX) 75 MG tablet Take 1 tablet by mouth daily. 06/15/13   Historical Provider, MD  Darunavir Ethanolate (PREZISTA) 800 MG tablet Take 1 tablet by mouth daily.    Historical Provider, MD  dolutegravir (TIVICAY) 50 MG tablet Take 1 tablet by mouth daily.    Historical Provider, MD  emtricitabine-tenofovir (TRUVADA) 200-300 MG per tablet Take 1 tablet by mouth daily.    Historical Provider, MD  gabapentin (NEURONTIN) 300 MG capsule One capsule at night for 2 weeks, then take one capsule twice a day 12/22/14   Jackson Ducking, MD    insulin aspart (NOVOLOG) 100 UNIT/ML injection Inject 6 Units into the skin 3 (three) times daily with meals. 6 units is the base. Add 2 units for each 15gm of carbs    Historical Provider, MD  isosorbide mononitrate (IMDUR) 30 MG 24 hr tablet Take 0.5 tablets (15 mg total) by mouth daily. 11/07/14   Pixie Casino, MD  levothyroxine (SYNTHROID, LEVOTHROID) 75 MCG tablet Take 225 mcg by mouth daily.    Historical Provider, MD  metFORMIN (GLUCOPHAGE-XR) 500 MG 24 hr tablet Take 1 tablet by mouth 2 (two) times daily.    Historical Provider, MD  metoprolol succinate (TOPROL XL) 25 MG 24 hr tablet Take 1 tablet (25 mg total) by mouth daily. 08/08/14   Pixie Casino, MD  pantoprazole (PROTONIX) 40 MG tablet Take 1 tablet by mouth daily.    Historical Provider, MD  ritonavir (NORVIR) 100 MG capsule Take 1 capsule by mouth daily.    Historical Provider, MD    Family History  Family History  Problem Relation Age of Onset  . Heart disease Mother   . Diabetes Mother   . Cancer Mother   . Heart attack Father   . Stroke Sister     ICH  . Diabetes Sister     Social History  Social History   Social History  . Marital Status: Single    Spouse Name: N/A  . Number of Children: 0  . Years of Education: MA   Occupational History  . Retired    Social History Main Topics  . Smoking status: Never Smoker   . Smokeless tobacco: Never Used  . Alcohol Use: 0.0 oz/week    0 Standard drinks or equivalent per week     Comment: 6 per month  . Drug Use: Not on file  . Sexual Activity: Not on file   Other Topics Concern  . Not on file   Social History Narrative   Patient lives at home with partner.   Caffeine use: 3 cups daily     Review of Systems General:  No chills, fever, night sweats or weight changes.  Cardiovascular:  No chest pain, dyspnea on exertion, edema, orthopnea, palpitations, paroxysmal nocturnal dyspnea. Dermatological: No rash, lesions/masses Respiratory: No cough,  dyspnea Urologic: No hematuria, dysuria Abdominal:   No nausea, vomiting, diarrhea, bright red blood per rectum, melena, or hematemesis Neurologic:  No visual changes, wkns, changes in mental status. All other systems reviewed and are otherwise negative except as noted above.  Physical Exam  Blood pressure 151/107, pulse 91, temperature 97.9 F (36.6 C), temperature source Oral, resp. rate 27, height 5\' 8"  (1.727 m), weight 162 lb (73.483 kg), SpO2 95 %.  General: Pleasant, NAD Psych: Normal affect. Neuro: Alert and oriented X 3. Moves all extremities spontaneously. HEENT: Normal  Neck: Supple without bruits or JVD. Lungs:  Resp regular and unlabored, CTA. Heart: RRR no s3, s4, or murmurs. Abdomen: Soft, non-tender, non-distended, BS + x 4.  Extremities: No clubbing, cyanosis or edema. DP/PT/Radials 2+ and equal bilaterally.  Labs  Troponin [facility] Test)  Recent Labs  04/19/15 0903  TROPIPOC 0.14*   No results for input(s): CKTOTAL, CKMB, TROPONINI in the last 72 hours. Lab Results  Component Value Date   WBC 4.6 04/19/2015   HGB 13.5 04/19/2015   HCT 40.6 04/19/2015   MCV 87.9 04/19/2015   PLT 179 04/19/2015   No results for input(s): NA, K, CL, CO2, BUN, CREATININE, CALCIUM, PROT, BILITOT, ALKPHOS, ALT, AST, GLUCOSE in the last 168 hours.  Invalid input(s): LABALBU Lab Results  Component Value Date   CHOL 234* 03/05/2010   HDL 37.60* 03/05/2010   TRIG 93.0 03/05/2010   No results found for: DDIMER   Radiology/Studies  No results found.  ECG  NSR. New ST depressions in the inferolateral leads   ASSESSMENT AND PLAN  1. Chest Pain: symptoms are concerning for unstable angina. Initial POC troponin is abnormal at 0.14. EKG with ST depressions in the inferolateral leads. He has known CAD s/p CABG in 2012 by Dr. Roxan Hockey. Most recent LHC in 2015 showed 3/4 patent grafts with 50% prox lesion in the SVG-PDA. Will likely need repeat cath. Will try to  arrange today. MD to assess.   2. H/O CVA: recent MRI of brain revealed acute lacunar infarct in left midbrain without hemorrhage or mass effect discovered at [facility]. Per patient, this likely occurred in June but was initially missed on imaging study and noticed 2 weeks ago when he presented back  with worsening right upper and lower extremity numbness and weakness. He is followed in [place].   3. HIV: Management per IM.    4. HLD: continue Lipitor. Check FLP in the am.   5. DM: Management per IM.  6. CHF: Reported EF of 35%. Volume stable. Hasn't been on many HF medications in the past due to h/o syncope. Repeat 2D echo.   7. HTN: BP is elevated at 1162/104. Order home metoprolol.    Signed, Lyda Jester, PA-C 04/19/2015, 9:28 AM

## 2015-04-19 NOTE — Progress Notes (Signed)
Family in. Ate small lunch

## 2015-04-20 ENCOUNTER — Other Ambulatory Visit (HOSPITAL_COMMUNITY): Payer: Medicaid Other

## 2015-04-20 ENCOUNTER — Encounter (HOSPITAL_COMMUNITY): Payer: Self-pay | Admitting: Nurse Practitioner

## 2015-04-20 DIAGNOSIS — E7800 Pure hypercholesterolemia, unspecified: Secondary | ICD-10-CM

## 2015-04-20 DIAGNOSIS — B20 Human immunodeficiency virus [HIV] disease: Secondary | ICD-10-CM

## 2015-04-20 LAB — BASIC METABOLIC PANEL
ANION GAP: 10 (ref 5–15)
BUN: 15 mg/dL (ref 6–20)
CO2: 22 mmol/L (ref 22–32)
Calcium: 8.8 mg/dL — ABNORMAL LOW (ref 8.9–10.3)
Chloride: 101 mmol/L (ref 101–111)
Creatinine, Ser: 1.35 mg/dL — ABNORMAL HIGH (ref 0.61–1.24)
GFR, EST NON AFRICAN AMERICAN: 56 mL/min — AB (ref >60)
GLUCOSE: 151 mg/dL — AB (ref 65–99)
POTASSIUM: 3.9 mmol/L (ref 3.5–5.1)
Sodium: 133 mmol/L — ABNORMAL LOW (ref 135–145)

## 2015-04-20 LAB — CBC
HEMATOCRIT: 36.1 % — AB (ref 39.0–52.0)
HEMOGLOBIN: 12.3 g/dL — AB (ref 13.0–17.0)
MCH: 30.1 pg (ref 26.0–34.0)
MCHC: 34.1 g/dL (ref 30.0–36.0)
MCV: 88.3 fL (ref 78.0–100.0)
Platelets: 161 10*3/uL (ref 150–400)
RBC: 4.09 MIL/uL — AB (ref 4.22–5.81)
RDW: 12.8 % (ref 11.5–15.5)
WBC: 7.9 10*3/uL (ref 4.0–10.5)

## 2015-04-20 LAB — GLUCOSE, CAPILLARY: GLUCOSE-CAPILLARY: 191 mg/dL — AB (ref 65–99)

## 2015-04-20 MED ORDER — NITROGLYCERIN 0.4 MG SL SUBL: 0.4000 mg | SUBLINGUAL_TABLET | SUBLINGUAL | Status: AC | PRN

## 2015-04-20 MED ORDER — METFORMIN HCL ER 500 MG PO TB24: 500.0000 mg | ORAL_TABLET | Freq: Two times a day (BID) | ORAL | Status: DC

## 2015-04-20 NOTE — Discharge Summary (Signed)
Discharge Summary   Patient ID: [facility],  MRN: [id number], DOB/AGE: Feb 12, 1956 59 y.o.  Admit date: 04/19/2015 Discharge date: 04/20/2015  Primary Care Provider: No primary care provider on file. Primary Cardiologist: C. Hilty, MD   Discharge Diagnoses    Principal Problem:   NSTEMI (non-ST elevated myocardial infarction) ([place])  **s/p catheterization revealing severe multivessel CAD with severe stenosis in the VG RPDA RPLB3 with PCI and drug-eluting stent placement to the proximal portion of the graft.  Active Problems:   S/P CABG x 5   Coronary artery disease involving coronary bypass graft of native heart with unstable angina pectoris (HCC)   DM2 (diabetes mellitus, type 2) (HCC)   RENAL DISEASE, CHRONIC, STAGE II   Human immunodeficiency virus (HIV) disease (HCC)   Hypothyroidism   HYPERCHOLESTEROLEMIA   GASTROESOPHAGEAL REFLUX DISEASE   Syncope   Status post placement of implantable loop recorder   CVA (cerebral vascular accident) ([place])   Allergies Allergies  Allergen Reactions  . Penicillins Anaphylaxis  . Codeine Nausea Only    Diagnostic Studies/Procedures    Cardiac Catheterization and Percutaneous Coronary Intervention 10.23.2016  Dominance: Right    Left Main  . Vessel is normal in caliber.   Colon Flattery LM to LM lesion, 100% stenosed. Chronic total occlusion located proximal to major branch.      Left Anterior Descending  . Vessel is moderate in size.   Colon Flattery LAD lesion, 100% stenosed.   . Prox LAD lesion, 100% stenosed. Diffuse chronic total occlusion.   . Dist LAD-1 lesion, 45% stenosed. Diffuse eccentric. stable   . Dist LAD-2 lesion, 95% stenosed. Diffuse located at the bend. apical   . First Diagonal Branch   The vessel is small in size.   Colon Flattery 1st Diag lesion, 100% stenosed. located at the major branch.   . 1st Diag lesion, 95% stenosed. Diffuse located at the major branch. Subtotally occluded   . First Septal Branch   The vessel is small  in size. 1st Sept filled by collaterals from [place].   Marland Kitchen Second Septal Branch   The vessel is small in size.   . Third Diagonal Branch   The vessel is small in size.      Ramus Intermedius  . Vessel is moderate in size. There is mild the vessel.   . Ramus lesion, 75% stenosed. Diffuse.   . Lateral Ramus Intermedius   The vessel is small in size.      Left Circumflex  . Vessel is small. There is moderate diffuse disease throughout the vessel. Dist Cx filled by collaterals from 3rd RPLB.   Colon Flattery Cx to Prox Cx lesion, 100% stenosed.   . First Obtuse Marginal Branch   The vessel is small in size. There is moderate disease in the vessel.   . Third Obtuse Marginal Branch   The vessel is small in size. There is severe and diffuse disease in the vessel.      Right Coronary Artery   . Ost RCA to Dist RCA lesion, 100% stenosed. Diffuse chronic total occlusion.   . Right Posterior Descending Artery   The vessel is small in size. There is moderate and diffuse disease in the vessel.   . Right Posterior Atrioventricular Branch   The vessel is small in size.   Marland Kitchen Post Atrio-1 lesion, 80% stenosed. Diffuse.   Marland Kitchen Post Atrio-2 lesion, 90% stenosed. Discrete.   . First Right Posterolateral   The vessel is small in  size. There is mild and diffuse disease in the vessel.   . 1st RPLB lesion, 80% stenosed.   . Second Right Posterolateral   The vessel is small in size. There is moderate and diffuse disease in the vessel.   . 2nd RPLB lesion, 80% stenosed. Discrete.   . Third Right Posterolateral   The vessel is small in size. 3rd RPLB filled by collaterals from Ost RCA.      Graft Angiography     Sequential Graft to 1st Diag, Ramus  Sequential SVG was injected is large. JACKY Catheter   . Origin to Prox Graft lesion before 1st Diag, 10% stenosed. Diffuse. The lesion was previously treated with a stent (unknown type) greater than two years ago.   . Mid Graft lesion before 1st Diag, 0% stenosed.  Diffuse. Previously placed Mid Graft stent (unknown type) before 1st Diag is patent.      Sequential Graft to RPDA, 3rd RPLB  Sequential SVG was injected is large. JACKY Catheter   . Dist Graft to Insertion lesion before RPDA, 99% stenosed. Diffuse with heavy thrombus ulcerative.   Marland Kitchen PCI: The proximal VG   RPDA was successfully stented using a 3.5 x 41mm Promus Premier DES.  Marland Kitchen There is no residual stenosis post intervention.     . Prox Graft lesion between RPDA and 3rd RPLB, 100% stenosed. Discrete chronic total occlusion.      Free LIMA Graft to Mid LAD  LIMA was injected is large, and is anatomically normal. IMA Catheter      _____________   2D Echocardiogram 10.23.2016  Study Conclusions  - Left ventricle: Diffuse hypokinesis septal and apical akinesis   The cavity size was severely dilated. Wall thickness was normal.   The estimated ejection fraction was 25%. - Mitral valve: There was mild regurgitation. - Left atrium: The atrium was mildly dilated. - Atrial septum: No defect or patent foramen ovale was identified. _____________   History of Present Illness  59 y/o male with a history of CAD s/p CABG x5, HIV, systolic CHF, ischemic cardiomyopathy, recurrent syncope s/p implantable loop recorder, DM, and strokes - most recently s/p left midbrain lacunar infarct earlier this month in [facility].  He was in his usual state of health until the morning of 11/23, when he developed chest discomfort while having an argument with his partner.  He presented to the [facility] ED where ECG was notable for new inferolateral ST depression, while troponin was elevated at 0.14.  He continued to c/o chest pain and decision was made to admit him for diagnostic catheterization and evaluation.  Hospital Course Consultants: None   [name] ruled for NSTEMI, eventually peaking his troponin at 38.53.  He underwent diagnostic catheterization on 11/23 revealing severe native CAD with patent LIMA  LAD and  sequential vein graft D1 Ramus.  The sequential vein graft  RPDA  RPLB3 was notable for a 99% stenosis within the graft proximal to the RPDA insertion.  Beyond the RPDA, the graft was chronically occluded.  The proximal stenosis was felt to be the culprit for current symptoms and was successfully treated using a 3.5 x 24 mm Promus Premier DES.  Mr. Whiteaker tolerated the procedure well and post-procedure has had no further chest pain.  2D echocardiography was performed on 11/23 revealing an EF of 25%, which is down from 45% in 2015.  He has been maintained on ASA, plavix, bb, nitrate, and statin therapy.  He is not on an ACEI/ARB/ARNI/aldosterone blocker secondary  to prior intolerance and h/o syncope in the setting of CHF medications.  Mr. Spradling has been seen by cardiac rehab and has been ambulating without recurrent symptoms.  He will be discharged home today in good condition.  We will arrange for f/u in our office within the next week. _____________  Discharge Vitals Blood pressure 127/74, pulse 87, temperature 98.5 F (36.9 C), temperature source Oral, resp. rate 13, height 5\' 8"  (1.727 m), weight 163 lb 12.8 oz (74.3 kg), SpO2 98 %.  Filed Weights   04/19/15 0846 04/19/15 1542  Weight: 162 lb (73.483 kg) 163 lb 12.8 oz (74.3 kg)   _____________  Labs     CBC  Recent Labs  04/19/15 0859 04/20/15 0252  WBC 4.6 7.9  HGB 13.5 12.3*  HCT 40.6 36.1*  MCV 87.9 88.3  PLT 179 [id number]   Basic Metabolic Panel  Recent Labs  04/19/15 0859 04/20/15 0252  NA 133* 133*  K 5.1 3.9  CL 99* 101  CO2 22 22  GLUCOSE 298* 151*  BUN 18 15  CREATININE 1.49* 1.35*  CALCIUM 9.5 8.8*   Liver Function Tests  Recent Labs  04/19/15 0859  AST 45*  ALT 21  ALKPHOS 84  BILITOT 2.0*  PROT 8.1  ALBUMIN 3.7    Recent Labs  04/19/15 0859  LIPASE 20   Cardiac Enzymes  Recent Labs  04/19/15 1115 04/19/15 1620 04/19/15 2301  TROPONINI 0.92* 12.92* 38.53*   _____________  Disposition    Pt is being discharged home today in good condition. _____________  Follow-up Plans & Appointments    Follow-up Information    Follow up with Pixie Casino, MD In 1 week.   Specialty:  Cardiology   Why:  We will arrange for f/u and contact you.   Contact information:   [address] [place] [address] [phone]      Discharge Instructions    AMB Referral to Cardiac Rehabilitation - Phase II    Complete by:  As directed   Diagnosis:   Myocardial Infarction PCI Heart Failure (see criteria below)       Amb Referral to Cardiac Rehabilitation    Complete by:  As directed   Diagnosis:   Myocardial Infarction PCI             Discharge Medications   Current Discharge Medication List    START taking these medications   Details  nitroGLYCERIN (NITROSTAT) 0.4 MG SL tablet Place 1 tablet (0.4 mg total) under the tongue every 5 (five) minutes as needed for chest pain. Qty: 25 tablet, Refills: 3      CONTINUE these medications which have CHANGED   Details  metFORMIN (GLUCOPHAGE-XR) 500 MG 24 hr tablet Take 1 tablet (500 mg total) by mouth 2 (two) times daily. **Resume on 04/22/2015.      CONTINUE these medications which have NOT CHANGED   Details  aspirin EC 81 MG tablet Take 1 tablet by mouth daily.    atorvastatin (LIPITOR) 20 MG tablet Take 1 tablet by mouth daily.    citalopram (CELEXA) 10 MG tablet Take 1 tablet by mouth daily.    clopidogrel (PLAVIX) 75 MG tablet Take 1 tablet by mouth daily.    Darunavir Ethanolate (PREZISTA) 800 MG tablet Take 1 tablet by mouth daily.    dolutegravir (TIVICAY) 50 MG tablet Take 1 tablet by mouth daily.    emtricitabine-tenofovir (TRUVADA) 200-300 MG per tablet Take 1 tablet by mouth daily.  gabapentin (NEURONTIN) 300 MG capsule One capsule at night for 2 weeks, then take one capsule twice a day Qty: 60 capsule, Refills: 3    insulin aspart (NOVOLOG) 100 UNIT/ML injection Inject 6 Units into the  skin 3 (three) times daily with meals. 6 units is the base. Add 2 units for each 15gm of carbs    isosorbide mononitrate (IMDUR) 30 MG 24 hr tablet Take 0.5 tablets (15 mg total) by mouth daily. Qty: 15 tablet, Refills: 11    levothyroxine (SYNTHROID, LEVOTHROID) 75 MCG tablet Take 225 mcg by mouth daily.    metoprolol succinate (TOPROL XL) 25 MG 24 hr tablet Take 1 tablet (25 mg total) by mouth daily. Qty: 30 tablet, Refills: 6    pantoprazole (PROTONIX) 40 MG tablet Take 1 tablet by mouth daily.    ritonavir (NORVIR) 100 MG capsule Take 1 capsule by mouth daily.         Aspirin prescribed at discharge:  Yes High Intensity Statin Prescribed? (Lipitor 40-80mg  or Crestor 20-40mg ): No: He is on statin but dose limited by HIV medications. Beta Blocker Prescribed: Yes ADP Receptor Inhibitor Prescribed? (i.e. Plavix etc.-Includes Medically Managed Patients): Yes For EF <40%, Aldosterone Inhibitor Prescribed? No: prior intolerance to CHF medications 2/2 syncope. Was EF assessed during THIS hospitalization? Yes Was Cardiac Rehab II ordered? (Included Medically managed Patients): Yes _____________   Outstanding Labs/Studies   None  Duration of Discharge Encounter   Greater than 30 minutes including physician time.  Signed, Murray Hodgkins NP 04/20/2015, 8:25 AM

## 2015-04-20 NOTE — Discharge Instructions (Signed)
**  PLEASE REMEMBER TO BRING ALL OF YOUR MEDICATIONS TO EACH OF YOUR FOLLOW-UP OFFICE VISITS. ° °NO HEAVY LIFTING X 2 WEEKS. °NO SEXUAL ACTIVITY X 2 WEEKS. °NO DRIVING X 1 WEEK. °NO SOAKING BATHS, HOT TUBS, POOLS, ETC., X 7 DAYS. ° °Groin Site Care °Refer to this sheet in the next few weeks. These instructions provide you with information on caring for yourself after your procedure. Your caregiver may also give you more specific instructions. Your treatment has been planned according to current medical practices, but problems sometimes occur. Call your caregiver if you have any problems or questions after your procedure. °HOME CARE INSTRUCTIONS °· You may shower 24 hours after the procedure. Remove the bandage (dressing) and gently wash the site with plain soap and water. Gently pat the site dry.  °· Do not apply powder or lotion to the site.  °· Do not sit in a bathtub, swimming pool, or whirlpool for 5 to 7 days.  °· No bending, squatting, or lifting anything over 10 pounds (4.5 kg) as directed by your caregiver.  °· Inspect the site at least twice daily.  °· Do not drive home if you are discharged the same day of the procedure. Have someone else drive you.  ° °What to expect: °· Any bruising will usually fade within 1 to 2 weeks.  °· Blood that collects in the tissue (hematoma) may be painful to the touch. It should usually decrease in size and tenderness within 1 to 2 weeks.  °SEEK IMMEDIATE MEDICAL CARE IF: °· You have unusual pain at the groin site or down the affected leg.  °· You have redness, warmth, swelling, or pain at the groin site.  °· You have drainage (other than a small amount of blood on the dressing).  °· You have chills.  °· You have a fever or persistent symptoms for more than 72 hours.  °· You have a fever and your symptoms suddenly get worse.  °· Your leg becomes pale, cool, tingly, or numb.  °You have heavy bleeding from the site. Hold pressure on the site. . °  °

## 2015-04-20 NOTE — Progress Notes (Signed)
patietn ambulated 1 full lap on unit, no complaints of pain or dizziness, no sob, family members at bedside and one person ambulated in hallway as well.  He is alert and oriented, ready for discharge.

## 2015-04-20 NOTE — Progress Notes (Signed)
SUBJECTIVE: No chest pain or SOB.   BP 127/74 mmHg  Pulse 87  Temp(Src) 98.5 F (36.9 C) (Oral)  Resp 13  Ht 5\' 8"  (1.727 m)  Wt 163 lb 12.8 oz (74.3 kg)  BMI 24.91 kg/m2  SpO2 98%  Intake/Output Summary (Last 24 hours) at 04/20/15 0819 Last data filed at 04/20/15 0800  Gross per 24 hour  Intake 358.81 ml  Output    775 ml  Net -416.19 ml    PHYSICAL EXAM General: Well developed, well nourished, in no acute distress. Alert and oriented x 3.  Psych:  Good affect, responds appropriately Neck: No JVD. No masses noted.  Lungs: Clear bilaterally with no wheezes or rhonci noted.  Heart: RRR with no murmurs noted. Abdomen: Bowel sounds are present. Soft, non-tender.  Extremities: No lower extremity edema.   LABS: Basic Metabolic Panel:  Recent Labs  04/19/15 0859 04/20/15 0252  NA 133* 133*  K 5.1 3.9  CL 99* 101  CO2 22 22  GLUCOSE 298* 151*  BUN 18 15  CREATININE 1.49* 1.35*  CALCIUM 9.5 8.8*   CBC:  Recent Labs  04/19/15 0859 04/20/15 0252  WBC 4.6 7.9  HGB 13.5 12.3*  HCT 40.6 36.1*  MCV 87.9 88.3  PLT 179 161   Cardiac Enzymes:  Recent Labs  04/19/15 1115 04/19/15 1620 04/19/15 2301  TROPONINI 0.92* 12.92* 38.53*   Current Meds: . aspirin EC  81 mg Oral Daily  . atorvastatin  20 mg Oral Daily  . citalopram  10 mg Oral Daily  . clopidogrel  75 mg Oral Daily  . Darunavir Ethanolate  800 mg Oral Q breakfast  . dolutegravir  50 mg Oral Daily  . emtricitabine-tenofovir AF  1 tablet Oral Daily  . gabapentin  300 mg Oral BID  . insulin aspart  0-15 Units Subcutaneous TID WC  . insulin aspart  6 Units Subcutaneous TID WC  . isosorbide mononitrate  15 mg Oral Daily  . levothyroxine  225 mcg Oral QAC breakfast  . metoprolol succinate  25 mg Oral Daily  . pantoprazole  40 mg Oral Daily  . ritonavir  100 mg Oral Q breakfast  . sodium chloride  3 mL Intravenous Q12H  . sodium chloride  3 mL Intravenous Q12H   Cardiac cath  04/19/15: 1. CULPRIT LESION: Dist SVG-RPDA Insertion lesion, before RPDA, 99% stenosed. PCI with Promus Premier DES 3.5 mm x 24 mm (tapered from 4.1-3.7 mm). Post intervention, there is a 0% residual stenosis. 2. There is severe left ventricular systolic dysfunction. 3. Severe native coronary artery disease with 100% occluded left main and ostial RCA. -- Collaterals from very proximal RCA branch distal RCA and distal small vessel circumflex. 4. Ost RCA to Dist RCA lesion, 100% stenosed. Ost LM to LM lesion, 100% stenosed. Ost to Prox Cx & LAD lesions, 100% stenosed. 5. Patent vein graft to RAMUS INTERMEDIUS with diffuse disease in the ramus upstream. The touchdown point the diagonal is barely visible. 6. Widely patent LIMA-LAD. Insertion site stable 45% disease with apical severe disease. 7. SEQUENTIAL limb from RPDA and 3rd RPLB, 100% stenosed - As previously described 8. Severe diffuse disease in the PDA and PL system as described:  Echo 04/19/15: Left ventricle: Diffuse hypokinesis septal and apical akinesis The cavity size was severely dilated. Wall thickness was normal. The estimated ejection fraction was 25%. - Mitral valve: There was mild regurgitation. - Left atrium: The atrium was mildly dilated. - Atrial septum:  No defect or patent foramen ovale was identified.  ASSESSMENT AND PLAN:  1. CAD/NSTEMI: Pt with known CAD s/p CABG in 2012 by Dr. Roxan Hockey. Admitted with NSTEMI. Cardiac cath 04/19/15 per Dr. Ellyn Hack with severe thrombotic stenosis distal body of SVG to PDA. Now s/p DES x 1 distal body of SVG to PDA. Continue ASA and Plavix for lifetime. Continue statin and beta blocker.   2. H/O CVA: recent MRI of brain revealed acute lacunar infarct in left midbrain without hemorrhage or mass effect discovered at [facility]. Per patient, this likely occurred in June but was initially missed on imaging study and noticed 2 weeks ago when he presented back with worsening right upper and lower  extremity numbness and weakness. He is followed in [place].   3. HIV: Management per IM.   4. HLD: continue Lipitor.   5. DM: Management per IM.  6. CHF: LVEF=25% by echo 04/19/15. This is chronic. Volume stable. Hasn't tolerated many HF medications in the past due to h/o syncope. Continue beta blocker.   7. HTN: BP controlled this am.   Discharge home today. Follow up Dr. Debara Pickett of office APP in 1-2 weeks.     Annelle Behrendt  11/24/20168:19 AM

## 2015-04-28 ENCOUNTER — Telehealth: Payer: Self-pay | Admitting: Internal Medicine

## 2015-04-28 NOTE — Telephone Encounter (Signed)
TOC per Gerald Stabs B 12/12 @ 9am w/ Cecille Rubin @ [address]

## 2015-05-02 NOTE — Telephone Encounter (Signed)
Patient contacted regarding discharge from [facility] on 11/24 Patient understands to follow up with provider Truitt Merle at [facility] Monday 12/12 at [place]. Patient understands discharge instructions? Yes Patient understands medications and regiment? Yes Patient understands to bring all medications to this visit? Yes

## 2015-05-07 NOTE — Progress Notes (Signed)
Cardiology Office Note   Date:  05/07/2015   ID:  Juliann Keeley, DOB [birth year], MRN [id number]  PCP:  No primary care provider on file.  Cardiologist:  Dr Mali Hilty / Dr. Sallyanne Kuster Neurologist: Dr. Margette Fast  No chief complaint on file.     History of Present Illness: Jackson Shaw is a 59 y.o. male who presents for post hospital followup. He has a past medical history of HIV disease dating back to 75s who has fortunately survived 2/2 multiple retroantiviral meditions, CAD s/p 5v CABG 2012 by Dr. Roxan Hockey (LIMA to LAD, sequential SVG to D1 and OM1, sequential SVG to posterior descending and posterolateral), ICM with EF 35%, h/o stroke, IDDM, HTN and HLD. He has had multiple MI in the past and at least 2 prior strokes. He underwent cardiac catheterization at [facility] on 01/20/2014 which showed EF 35% was anterior dyskinesis and inferior hypokinesis, 3/4 grafts patent (SVG to PDA, SVG to ramus, LIMA to LAD) with widely patent stent to SVG to ramus, no change from prior cath. He had a loop recorder placed for eval for syncope which has been followed by Dr. Sallyanne Kuster, no clear correlation or any arrhythmia was found on last loop recorder interrogation. He is not on any antihypertensives or heart failure medication due to syncope. He was previously on Imdur, however this was stopped due to possible orthostasis. He is not a candidate for ranolazine as he is on protease inhibitor. He saw both Dr. Sallyanne Kuster and Dr. Debara Pickett in June, at that time he had some exertional chest discomfort which could be related to peripheral neuropathy or coronary vessel disease, Dr. Debara Pickett recommended an outpatient stress test since his last cath was in 2015, but this was never done. 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  He presented to the ED on 11/23 with chest pain. He was ruled in as NSTEMI elevated troponin and ST depression in  inferolateral leads not seen on previous EKGs. He underwent cardiac catheterization on the same day, the culprit lesion was felt to be to 99% stenosis in distal SVG to RPDA which was treated with Promus Premier DES 3.5 mm x 24 mm stent. Patent SVG to ramus intermedius, widely patent LIMA to LAD, stenosed sequential SVG limb from RPDA and third RPLB as previously described. It was recommended he continue lifelong DAPT with aspirin and Plavix. Post cath, his troponin trended up to 38.53 before trending back down. Echocardiogram was obtained on the day of admission which showed EF 25%, mild MR.    Left heart catheterization (01/20/14)at [facility] hospital revealed:   Left Ventriculography: 35% with anterior dk and inferior hk  Coronary Angiography: Left Main: 100 LAD: 100 prox. LCX: 99 prox RCA: 100 prox SVG to pda has a 50% prox lesion. It ties into a normal pda and backfillsa a small pl system through a severe lesion. This is unchanged from prior cath SVG to ramus: There are patent prox and mid stents. This graft had been a jump graft to diag and ramus but the diag is subtotally occluded at the anastamosis (unchanged from prior). The ramus is large LIMA to lad ties into mid lad. There is a 50 in the lad after the touchdown  Impression: Severe 3 v cad Moderately decreased lv ef 3/4 grafts patent with widely patent stents in the svg to ramus No change from prior cath    Left heart Catherization  04/19/2015 by Dr. Ellyn Hack 1. CULPRIT LESION: Dist SVG-RPDA Insertion lesion, before RPDA, 99% stenosed. PCI with Promus Premier DES 3.5 mm x 24 mm (tapered from 4.1-3.7 mm). Post intervention, there is a 0% residual stenosis. 2. There is severe left ventricular systolic dysfunction. 3. Severe native coronary artery disease with 100% occluded left main and ostial RCA. -- Collaterals from very proximal RCA branch distal RCA and distal small vessel circumflex. 4. Ost RCA to Dist RCA lesion, 100%  stenosed. Ost LM to LM lesion, 100% stenosed. Ost to Prox Cx & LAD lesions, 100% stenosed. 5. Patent vein graft to RAMUS INTERMEDIUS with diffuse disease in the ramus upstream. The touchdown point the diagonal is barely visible. 6. Widely patent LIMA-LAD. Insertion site stable 45% disease with apical severe disease. 7. SEQUENTIAL limb from RPDA and 3rd RPLB, 100% stenosed - As previously described 8. Severe diffuse disease in the PDA and PL system as described:   Severe ischemic cardiomyopathy, EF appears to be lower than previously reported in the setting of acute non-ST elevation MI with severe thrombotic stenosis of the distal SVG-RPDA. Successful SVG PCI.  Plan:  Admit to TCU for post PCI monitoring and TR band removal per protocol  Lifelong dual antiplatelet therapy with aspirin plus Plavix  Check 2-D echocardiogram in the morning to get a better assessment of EF - will need to initiate heart failure management protocol  Cardiac Rehabilitation Consultation   Echocardiogram 04/19/2015  LV EF: 25%  ------------------------------------------------------------------- Indications:   Chest pain 786.51.  ------------------------------------------------------------------- History:  Risk factors: History of stroke. Diabetes mellitus. Dyslipidemia.  ------------------------------------------------------------------- Study Conclusions  - Left ventricle: Diffuse hypokinesis septal and apical akinesis The cavity size was severely dilated. Wall thickness was normal. The estimated ejection fraction was 25%. - Mitral valve: There was mild regurgitation. - Left atrium: The atrium was mildly dilated. - Atrial septum: No defect or patent foramen ovale was identified.   Past Medical History  Diagnosis Date  . Diabetes mellitus without complication ([place])   . Coronary artery disease     a. s/p prior CABG;  b. 03/2015 NSTEMI/PCI: 100ost CTO, LAD 100ost/p, 45d, D1 100/95ost, RI  75, LCX 100p, OM1/3 small, RCA 100ost->distal, RPDA/RPAV small,RPA1 80, RPA2 90, RPLB 80, RPLB2 80,  VG->D1->RI 10p, patent stents, VG->RPDA->RPLB3 99 before RPDA (3.5x24 Promus DES), 100 between PDA& RPLB3 (CTO), LIMA->LAD nl.  . Chronic systolic CHF (congestive heart failure) ([place])     a. 12/2013 Echo: EF 45%;  b. 03/2015 Echo: EF 35%, mild MR, mildly dil LA.  Marland Kitchen Hyperlipidemia   . Hypertension   . Stroke [facility])     a. 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  Marland Kitchen Syncope and collapse     a. 12/2013 s/p MDT Linq ILR.  Marland Kitchen HIV (human immunodeficiency virus infection) ([place])     a. Dx in 1980's.  . Cancer ([place])   . Diabetic neuropathy ([place]) 12/22/2014    Past Surgical History  Procedure Laterality Date  . Loop recorder implant      Medtronic Reveal Model # [id number] MR safe  . Cardiac catheterization N/A 04/19/2015    Procedure: Left Heart Cath and Coronary Angiography;  Surgeon: Leonie Man, MD;  Location: [place] CV LAB;  Service: Cardiovascular;  Laterality: N/A;  . Cardiac catheterization N/A 04/19/2015    Procedure: Coronary Stent Intervention;  Surgeon: Leonie Man, MD;  Location: [place] CV LAB;  Service: Cardiovascular;  Laterality: N/A;  . Coronary artery bypass graft  Current Outpatient Prescriptions  Medication Sig Dispense Refill  . aspirin EC 81 MG tablet Take 1 tablet by mouth daily.    Marland Kitchen atorvastatin (LIPITOR) 20 MG tablet Take 1 tablet by mouth daily.    . citalopram (CELEXA) 10 MG tablet Take 1 tablet by mouth daily.    . clopidogrel (PLAVIX) 75 MG tablet Take 1 tablet by mouth daily.    . Darunavir Ethanolate (PREZISTA) 800 MG tablet Take 1 tablet by mouth daily.    . dolutegravir (TIVICAY) 50 MG tablet Take 1 tablet by mouth daily.    Marland Kitchen emtricitabine-tenofovir (TRUVADA) 200-300 MG per tablet Take 1 tablet by mouth daily.    Marland Kitchen gabapentin (NEURONTIN) 300 MG capsule One capsule at night for 2 weeks, then take one capsule twice a day 60 capsule 3    . insulin aspart (NOVOLOG) 100 UNIT/ML injection Inject 6 Units into the skin 3 (three) times daily with meals. 6 units is the base. Add 2 units for each 15gm of carbs    . isosorbide mononitrate (IMDUR) 30 MG 24 hr tablet Take 0.5 tablets (15 mg total) by mouth daily. 15 tablet 11  . levothyroxine (SYNTHROID, LEVOTHROID) 75 MCG tablet Take 225 mcg by mouth daily.    . metFORMIN (GLUCOPHAGE-XR) 500 MG 24 hr tablet Take 1 tablet (500 mg total) by mouth 2 (two) times daily.    . metoprolol succinate (TOPROL XL) 25 MG 24 hr tablet Take 1 tablet (25 mg total) by mouth daily. 30 tablet 6  . nitroGLYCERIN (NITROSTAT) 0.4 MG SL tablet Place 1 tablet (0.4 mg total) under the tongue every 5 (five) minutes as needed for chest pain. 25 tablet 3  . pantoprazole (PROTONIX) 40 MG tablet Take 1 tablet by mouth daily.    . ritonavir (NORVIR) 100 MG capsule Take 1 capsule by mouth daily.     No current facility-administered medications for this visit.    Allergies:   Penicillins and Codeine    Social History:  The patient  reports that he has never smoked. He has never used smokeless tobacco. He reports that he drinks about 0.6 oz of alcohol per week. He reports that he does not use illicit drugs.   Family History:  The patient's family history includes Cancer in his mother; Diabetes in his mother and sister; Heart attack in his father; Heart disease in his mother; Stroke in his sister.    ROS:  Please see the history of present illness.   Otherwise, review of systems are positive for none.   All other systems are reviewed and negative.    PHYSICAL EXAM: VS:  There were no vitals taken for this visit. , BMI There is no weight on file to calculate BMI. GEN: Well nourished, well developed, in no acute distress HEENT: normal Neck: no, carotid bruits, or masses Cardiac: RRR; no murmurs, rubs, or gallops,no edema  Respiratory:  clear to auscultation bilaterally, normal work of breathing GI: soft,  nontender, nondistended, + BS MS: no deformity or atrophy Skin: warm and dry, no rash Neuro:  Strength and sensation are intact Psych: euthymic mood, full affect   EKG:  EKG is ordered today. The ekg ordered today demonstrates    Recent Labs: 04/19/2015: ALT 21 04/20/2015: BUN 15; Creatinine, Ser 1.35*; Hemoglobin 12.3*; Platelets 161; Potassium 3.9; Sodium 133*    Lipid Panel    Component Value Date/Time   CHOL 234* 03/05/2010 1200   TRIG 93.0 03/05/2010 1200   HDL 37.60* 03/05/2010 1200  CHOLHDL 6 03/05/2010 1200   VLDL 18.6 03/05/2010 1200   LDLDIRECT 176.0 03/05/2010 1200      Wt Readings from Last 3 Encounters:  04/19/15 163 lb 12.8 oz (74.3 kg)  12/22/14 170 lb 3.2 oz (77.202 kg)  11/07/14 169 lb (76.658 kg)      Other studies Reviewed: Additional studies/ records that were reviewed today include: . Review of the above records demonstrates:    ASSESSMENT AND PLAN:  1.  CAD s/p 5v CABG 2012   - see above for cath report at [facility] in 2015  - Cath 04/19/2015 for NSTEMI the culprit lesion was felt to be to 99% stenosis in distal SVG to RPDA which was treated with Promus Premier DES 3.5 mm x 24 mm stent. Patent SVG to ramus intermedius, widely patent LIMA to LAD, stenosed sequential SVG limb from RPDA and third RPLB as previously described. Need Life long DAPT  - continue ASA and plavix  2. ICM with EF previously 35%, recent echo on 04/19/2015 showed EF down to 25%. Not on RAAS inhibitors following (hypotensive) syncope. Obtain Echo in 3 month, if EF still low, need to think about if he is an ICD candidate and long term expectation and life expectancy given HIV and severe CAD  3. H/o near syncope s/p medtronic loop recorder: per Dr. 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 Recommended avoid diuretics and  vasodilators.  4. CKD stage III: Cr 1.3-1.5, stable post cath. Will recheck BMET  5. 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  The patient  concerns regarding medicines.  The following changes have been made:  no change  Labs/ tests ordered today include:  No orders of the defined types were placed in this encounter.     Disposition:   FU with  in 2 days  Hilbert Corrigan PA 05/07/2015 6:39 PM    [facility] [address], [place], [place]  [address] Phone: [phone]; Fax: [phone]    This encounter was created in error - please disregard.

## 2015-05-08 ENCOUNTER — Encounter: Payer: Medicaid Other | Admitting: Physician Assistant

## 2015-05-08 ENCOUNTER — Ambulatory Visit: Payer: Medicaid Other | Admitting: Nurse Practitioner

## 2015-05-08 ENCOUNTER — Other Ambulatory Visit: Payer: Self-pay | Admitting: Physician Assistant

## 2015-05-08 DIAGNOSIS — R0989 Other specified symptoms and signs involving the circulatory and respiratory systems: Secondary | ICD-10-CM

## 2015-06-27 ENCOUNTER — Emergency Department (HOSPITAL_COMMUNITY): Payer: Medicaid Other

## 2015-06-27 ENCOUNTER — Inpatient Hospital Stay (HOSPITAL_COMMUNITY)
Admission: EM | Admit: 2015-06-27 | Discharge: 2015-06-29 | DRG: 177 | Disposition: A | Discharge status: Home / Self Care | Payer: Medicaid Other | Attending: Internal Medicine | Admitting: Internal Medicine

## 2015-06-27 ENCOUNTER — Encounter (HOSPITAL_COMMUNITY): Payer: Self-pay | Admitting: Emergency Medicine

## 2015-06-27 DIAGNOSIS — Z951 Presence of aortocoronary bypass graft: Secondary | ICD-10-CM

## 2015-06-27 DIAGNOSIS — J90 Pleural effusion, not elsewhere classified: Secondary | ICD-10-CM

## 2015-06-27 DIAGNOSIS — B20 Human immunodeficiency virus [HIV] disease: Secondary | ICD-10-CM | POA: Diagnosis not present

## 2015-06-27 DIAGNOSIS — I252 Old myocardial infarction: Secondary | ICD-10-CM

## 2015-06-27 DIAGNOSIS — R0602 Shortness of breath: Secondary | ICD-10-CM

## 2015-06-27 DIAGNOSIS — Z794 Long term (current) use of insulin: Secondary | ICD-10-CM

## 2015-06-27 DIAGNOSIS — E039 Hypothyroidism, unspecified: Secondary | ICD-10-CM | POA: Diagnosis present

## 2015-06-27 DIAGNOSIS — I25700 Atherosclerosis of coronary artery bypass graft(s), unspecified, with unstable angina pectoris: Secondary | ICD-10-CM | POA: Diagnosis present

## 2015-06-27 DIAGNOSIS — Z880 Allergy status to penicillin: Secondary | ICD-10-CM

## 2015-06-27 DIAGNOSIS — R0600 Dyspnea, unspecified: Secondary | ICD-10-CM | POA: Diagnosis not present

## 2015-06-27 DIAGNOSIS — IMO0002 Reserved for concepts with insufficient information to code with codable children: Secondary | ICD-10-CM

## 2015-06-27 DIAGNOSIS — I509 Heart failure, unspecified: Secondary | ICD-10-CM

## 2015-06-27 DIAGNOSIS — I69351 Hemiplegia and hemiparesis following cerebral infarction affecting right dominant side: Secondary | ICD-10-CM

## 2015-06-27 DIAGNOSIS — I255 Ischemic cardiomyopathy: Secondary | ICD-10-CM | POA: Diagnosis present

## 2015-06-27 DIAGNOSIS — I5022 Chronic systolic (congestive) heart failure: Secondary | ICD-10-CM | POA: Diagnosis present

## 2015-06-27 DIAGNOSIS — Z8673 Personal history of transient ischemic attack (TIA), and cerebral infarction without residual deficits: Secondary | ICD-10-CM

## 2015-06-27 DIAGNOSIS — R0603 Acute respiratory distress: Secondary | ICD-10-CM | POA: Diagnosis present

## 2015-06-27 DIAGNOSIS — N183 Chronic kidney disease, stage 3 (moderate): Secondary | ICD-10-CM | POA: Diagnosis present

## 2015-06-27 DIAGNOSIS — E1165 Type 2 diabetes mellitus with hyperglycemia: Secondary | ICD-10-CM | POA: Diagnosis present

## 2015-06-27 DIAGNOSIS — I130 Hypertensive heart and chronic kidney disease with heart failure and stage 1 through stage 4 chronic kidney disease, or unspecified chronic kidney disease: Secondary | ICD-10-CM | POA: Diagnosis present

## 2015-06-27 DIAGNOSIS — E1140 Type 2 diabetes mellitus with diabetic neuropathy, unspecified: Secondary | ICD-10-CM | POA: Diagnosis present

## 2015-06-27 DIAGNOSIS — E785 Hyperlipidemia, unspecified: Secondary | ICD-10-CM | POA: Diagnosis present

## 2015-06-27 DIAGNOSIS — E11649 Type 2 diabetes mellitus with hypoglycemia without coma: Secondary | ICD-10-CM | POA: Diagnosis present

## 2015-06-27 DIAGNOSIS — J690 Pneumonitis due to inhalation of food and vomit: Secondary | ICD-10-CM | POA: Diagnosis not present

## 2015-06-27 DIAGNOSIS — E1122 Type 2 diabetes mellitus with diabetic chronic kidney disease: Secondary | ICD-10-CM | POA: Diagnosis present

## 2015-06-27 DIAGNOSIS — Z885 Allergy status to narcotic agent status: Secondary | ICD-10-CM

## 2015-06-27 DIAGNOSIS — I25110 Atherosclerotic heart disease of native coronary artery with unstable angina pectoris: Secondary | ICD-10-CM | POA: Diagnosis present

## 2015-06-27 HISTORY — DX: Personal history of other diseases of the digestive system: Z87.19

## 2015-06-27 HISTORY — DX: Gastro-esophageal reflux disease without esophagitis: K21.9

## 2015-06-27 HISTORY — DX: Type 2 diabetes mellitus without complications: E11.9

## 2015-06-27 HISTORY — DX: Non-ST elevation (NSTEMI) myocardial infarction: I21.4

## 2015-06-27 HISTORY — DX: Hypothyroidism, unspecified: E03.9

## 2015-06-27 HISTORY — DX: Kaposi's sarcoma, unspecified: C46.9

## 2015-06-27 HISTORY — DX: Anxiety disorder, unspecified: F41.9

## 2015-06-27 HISTORY — DX: Pneumonitis due to inhalation of food and vomit: J69.0

## 2015-06-27 HISTORY — DX: Calculus of kidney: N20.0

## 2015-06-27 HISTORY — DX: Angina pectoris, unspecified: I20.9

## 2015-06-27 HISTORY — DX: Pneumonia, unspecified organism: J18.9

## 2015-06-27 HISTORY — DX: Acute myocardial infarction, unspecified: I21.9

## 2015-06-27 LAB — GLUCOSE, CAPILLARY
Glucose-Capillary: 220 mg/dL — ABNORMAL HIGH (ref 65–99)
Glucose-Capillary: 284 mg/dL — ABNORMAL HIGH (ref 65–99)

## 2015-06-27 LAB — CBC WITH DIFFERENTIAL/PLATELET
BASOS ABS: 0 10*3/uL (ref 0.0–0.1)
BASOS PCT: 0 %
Basophils Absolute: 0 10*3/uL (ref 0.0–0.1)
Basophils Relative: 0 %
EOS ABS: 0 10*3/uL (ref 0.0–0.7)
EOS PCT: 1 %
Eosinophils Absolute: 0 10*3/uL (ref 0.0–0.7)
Eosinophils Relative: 0 %
HEMATOCRIT: 38.3 % — AB (ref 39.0–52.0)
HEMATOCRIT: 35.9 % — AB (ref 39.0–52.0)
HEMOGLOBIN: 12.9 g/dL — AB (ref 13.0–17.0)
Hemoglobin: 12.4 g/dL — ABNORMAL LOW (ref 13.0–17.0)
LYMPHS ABS: 2.5 10*3/uL (ref 0.7–4.0)
LYMPHS PCT: 35 %
Lymphocytes Relative: 22 %
Lymphs Abs: 1.7 10*3/uL (ref 0.7–4.0)
MCH: 29 pg (ref 26.0–34.0)
MCH: 29.7 pg (ref 26.0–34.0)
MCHC: 33.7 g/dL (ref 30.0–36.0)
MCHC: 34.5 g/dL (ref 30.0–36.0)
MCV: 86.1 fL (ref 78.0–100.0)
MCV: 86.1 fL (ref 78.0–100.0)
MONO ABS: 0.5 10*3/uL (ref 0.1–1.0)
MONOS PCT: 5 %
MONOS PCT: 6 %
Monocytes Absolute: 0.4 10*3/uL (ref 0.1–1.0)
NEUTROS ABS: 5.8 10*3/uL (ref 1.7–7.7)
NEUTROS ABS: 4 10*3/uL (ref 1.7–7.7)
NEUTROS PCT: 73 %
Neutrophils Relative %: 58 %
Platelets: 169 10*3/uL (ref 150–400)
Platelets: 168 10*3/uL (ref 150–400)
RBC: 4.45 MIL/uL (ref 4.22–5.81)
RBC: 4.17 MIL/uL — ABNORMAL LOW (ref 4.22–5.81)
RDW: 12.3 % (ref 11.5–15.5)
RDW: 12.3 % (ref 11.5–15.5)
WBC: 8 10*3/uL (ref 4.0–10.5)
WBC: 7 10*3/uL (ref 4.0–10.5)

## 2015-06-27 LAB — COMPREHENSIVE METABOLIC PANEL
ALBUMIN: 3.5 g/dL (ref 3.5–5.0)
ALK PHOS: 132 U/L — AB (ref 38–126)
ALT: 27 U/L (ref 17–63)
AST: 30 U/L (ref 15–41)
Anion gap: 11 (ref 5–15)
BILIRUBIN TOTAL: 1.6 mg/dL — AB (ref 0.3–1.2)
BUN: 18 mg/dL (ref 6–20)
CALCIUM: 9.1 mg/dL (ref 8.9–10.3)
CO2: 25 mmol/L (ref 22–32)
Chloride: 96 mmol/L — ABNORMAL LOW (ref 101–111)
Creatinine, Ser: 1.36 mg/dL — ABNORMAL HIGH (ref 0.61–1.24)
GFR calc Af Amer: >60 mL/min (ref >60)
GFR calc non Af Amer: 56 mL/min — ABNORMAL LOW (ref >60)
GLUCOSE: 297 mg/dL — AB (ref 65–99)
Potassium: 4.3 mmol/L (ref 3.5–5.1)
SODIUM: 132 mmol/L — AB (ref 135–145)
TOTAL PROTEIN: 7.6 g/dL (ref 6.5–8.1)

## 2015-06-27 LAB — BRAIN NATRIURETIC PEPTIDE: B Natriuretic Peptide: 1767.6 pg/mL — ABNORMAL HIGH (ref 0.0–100.0)

## 2015-06-27 LAB — INFLUENZA PANEL BY PCR (TYPE A & B)
H1N1 flu by pcr: NOT DETECTED
INFLAPCR: NEGATIVE
INFLBPCR: NEGATIVE

## 2015-06-27 LAB — PROTIME-INR
INR: 1.04 (ref 0.00–1.49)
Prothrombin Time: 13.8 seconds (ref 11.6–15.2)

## 2015-06-27 LAB — I-STAT TROPONIN, ED: TROPONIN I, POC: 0.03 ng/mL (ref 0.00–0.08)

## 2015-06-27 LAB — LACTATE DEHYDROGENASE: LDH: 155 U/L (ref 98–192)

## 2015-06-27 MED ORDER — CITALOPRAM HYDROBROMIDE 20 MG PO TABS
10.0000 mg | ORAL_TABLET | Freq: Every day | ORAL | Status: DC
  Administered 2015-06-28 – 2015-06-29 (×2): 10 mg via ORAL
  Filled 2015-06-27 (×3): qty 1

## 2015-06-27 MED ORDER — CLOPIDOGREL BISULFATE 75 MG PO TABS
75.0000 mg | ORAL_TABLET | Freq: Every day | ORAL | Status: DC
  Administered 2015-06-28 – 2015-06-29 (×2): 75 mg via ORAL
  Filled 2015-06-27 (×3): qty 1

## 2015-06-27 MED ORDER — ENSURE ENLIVE PO LIQD: 237.0000 mL | Freq: Two times a day (BID) | ORAL | Status: DC

## 2015-06-27 MED ORDER — SODIUM CHLORIDE 0.9 % IV SOLN: 500.0000 mg | Freq: Three times a day (TID) | INTRAVENOUS | Status: DC

## 2015-06-27 MED ORDER — SODIUM CHLORIDE 0.9 % IV BOLUS (SEPSIS)
500.0000 mL | Freq: Once | INTRAVENOUS | Status: AC
  Administered 2015-06-27: 500 mL via INTRAVENOUS

## 2015-06-27 MED ORDER — ALBUTEROL SULFATE (2.5 MG/3ML) 0.083% IN NEBU: 2.5000 mg | INHALATION_SOLUTION | Freq: Once | RESPIRATORY_TRACT | Status: DC

## 2015-06-27 MED ORDER — DOLUTEGRAVIR SODIUM 50 MG PO TABS
50.0000 mg | ORAL_TABLET | Freq: Every day | ORAL | Status: DC
  Administered 2015-06-28 – 2015-06-29 (×2): 50 mg via ORAL
  Filled 2015-06-27 (×3): qty 1

## 2015-06-27 MED ORDER — GABAPENTIN 300 MG PO CAPS
300.0000 mg | ORAL_CAPSULE | Freq: Two times a day (BID) | ORAL | Status: DC
  Administered 2015-06-28: 300 mg via ORAL
  Filled 2015-06-27 (×3): qty 1

## 2015-06-27 MED ORDER — ENOXAPARIN SODIUM 40 MG/0.4ML ~~LOC~~ SOLN
40.0000 mg | SUBCUTANEOUS | Status: DC
  Administered 2015-06-28 (×2): 40 mg via SUBCUTANEOUS
  Filled 2015-06-27 (×2): qty 0.4

## 2015-06-27 MED ORDER — DEXTROSE 5 % IV SOLN
500.0000 mg | INTRAVENOUS | Status: DC
  Administered 2015-06-27 – 2015-06-28 (×2): 500 mg via INTRAVENOUS
  Filled 2015-06-27 (×3): qty 500

## 2015-06-27 MED ORDER — EMTRICITABINE-TENOFOVIR AF 200-25 MG PO TABS
1.0000 | ORAL_TABLET | Freq: Every day | ORAL | Status: DC
  Administered 2015-06-28 – 2015-06-29 (×2): 1 via ORAL
  Filled 2015-06-27 (×3): qty 1

## 2015-06-27 MED ORDER — INSULIN ASPART 100 UNIT/ML ~~LOC~~ SOLN
0.0000 [IU] | Freq: Every day | SUBCUTANEOUS | Status: DC
  Administered 2015-06-27: 3 [IU] via SUBCUTANEOUS

## 2015-06-27 MED ORDER — ISOSORBIDE MONONITRATE ER 30 MG PO TB24
15.0000 mg | ORAL_TABLET | Freq: Every day | ORAL | Status: DC
  Administered 2015-06-28 – 2015-06-29 (×2): 15 mg via ORAL
  Filled 2015-06-27 (×3): qty 1

## 2015-06-27 MED ORDER — INSULIN ASPART 100 UNIT/ML ~~LOC~~ SOLN
0.0000 [IU] | Freq: Three times a day (TID) | SUBCUTANEOUS | Status: DC
  Administered 2015-06-27 – 2015-06-28 (×3): 3 [IU] via SUBCUTANEOUS
  Administered 2015-06-29 (×2): 2 [IU] via SUBCUTANEOUS

## 2015-06-27 MED ORDER — METOPROLOL SUCCINATE ER 25 MG PO TB24
25.0000 mg | ORAL_TABLET | Freq: Every day | ORAL | Status: DC
  Administered 2015-06-28 – 2015-06-29 (×2): 25 mg via ORAL
  Filled 2015-06-27 (×3): qty 1

## 2015-06-27 MED ORDER — ATORVASTATIN CALCIUM 20 MG PO TABS
20.0000 mg | ORAL_TABLET | Freq: Every day | ORAL | Status: DC
  Administered 2015-06-28 – 2015-06-29 (×2): 20 mg via ORAL
  Filled 2015-06-27 (×3): qty 1

## 2015-06-27 MED ORDER — ASPIRIN EC 81 MG PO TBEC
81.0000 mg | DELAYED_RELEASE_TABLET | Freq: Every day | ORAL | Status: DC
  Administered 2015-06-28 – 2015-06-29 (×2): 81 mg via ORAL
  Filled 2015-06-27 (×3): qty 1

## 2015-06-27 MED ORDER — LEVOTHYROXINE SODIUM 75 MCG PO TABS
225.0000 ug | ORAL_TABLET | Freq: Every day | ORAL | Status: DC
  Administered 2015-06-28 – 2015-06-29 (×2): 225 ug via ORAL
  Filled 2015-06-27 (×2): qty 3

## 2015-06-27 MED ORDER — DARUNAVIR ETHANOLATE 800 MG PO TABS
800.0000 mg | ORAL_TABLET | Freq: Every day | ORAL | Status: DC
  Administered 2015-06-28 – 2015-06-29 (×2): 800 mg via ORAL
  Filled 2015-06-27 (×3): qty 1

## 2015-06-27 MED ORDER — RITONAVIR 100 MG PO CAPS
100.0000 mg | ORAL_CAPSULE | Freq: Every day | ORAL | Status: DC
  Administered 2015-06-28 – 2015-06-29 (×2): 100 mg via ORAL
  Filled 2015-06-27 (×3): qty 1

## 2015-06-27 MED ORDER — CLINDAMYCIN PHOSPHATE 600 MG/50ML IV SOLN
600.0000 mg | Freq: Two times a day (BID) | INTRAVENOUS | Status: DC
  Administered 2015-06-27 – 2015-06-28 (×3): 600 mg via INTRAVENOUS
  Filled 2015-06-27 (×3): qty 50

## 2015-06-27 NOTE — H&P (Signed)
[facility] History and Physical                                                                                    Jackson Shaw, is a 60 y.o. male  MRN: [id number]   DOB - 1956/04/05  Admit Date - 06/27/2015  Outpatient Primary MD for the patient is No primary care provider on file.  Referring MD: Alvino Chapel / ER  PMH: Past Medical History  Diagnosis Date  . Diabetes mellitus without complication ([place])   . Coronary artery disease     a. s/p prior CABG;  b. 03/2015 NSTEMI/PCI: 100ost CTO, LAD 100ost/p, 45d, D1 100/95ost, RI 75, LCX 100p, OM1/3 small, RCA 100ost->distal, RPDA/RPAV small,RPA1 80, RPA2 90, RPLB 80, RPLB2 80,  VG->D1->RI 10p, patent stents, VG->RPDA->RPLB3 99 before RPDA (3.5x24 Promus DES), 100 between PDA& RPLB3 (CTO), LIMA->LAD nl.  . Chronic systolic CHF (congestive heart failure) ([place])     a. 12/2013 Echo: EF 45%;  b. 03/2015 Echo: EF 35%, mild MR, mildly dil LA.  Marland Kitchen Hyperlipidemia   . Hypertension   . Stroke [facility])     a. 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  Marland Kitchen Syncope and collapse     a. 12/2013 s/p MDT Linq ILR.  Marland Kitchen HIV (human immunodeficiency virus infection) ([place])     a. Dx in 1980's.  . Cancer ([place])   . Diabetic neuropathy ([place]) 12/22/2014      PSH: Past Surgical History  Procedure Laterality Date  . Loop recorder implant      Medtronic Reveal Model # [id number] MR safe  . Cardiac catheterization N/A 04/19/2015    Procedure: Left Heart Cath and Coronary Angiography;  Surgeon: Leonie Man, MD;  Location: [place] [facility];  Service: Cardiovascular;  Laterality: N/A;  . Cardiac catheterization N/A 04/19/2015    Procedure: Coronary Stent Intervention;  Surgeon: Leonie Man, MD;  Location: [place] [facility];  Service: Cardiovascular;  Laterality: N/A;  . Coronary artery bypass graft       CC:  Chief Complaint  Patient presents with  . Shortness of Breath     HPI: This is a 60 year old male patient with known history of HIV  disease on ART and is followed by the [facility] ID clinics. He was most recently hospitalized in November 2016 for non-STEMI and underwent cardiac catheterization placement of drug-eluting stent to vein graft of previous CABG. He was continued on aspirin and Plavix (lifetime),  beta blocker, nitrites and statin therapy. He is not on ACE/ARB or aldosterone blocker due to prior intolerance and history of syncope in setting of aggressive CHF medications. He also has a history of diabetes on meal coverage only as well as chronic kidney disease and prior CVA with residual right-sided weakness and minimal dysarthria. He presents to the hospital with reports of shortness of breath with a sensation of breath feeling "labored" and having difficulty taking in a good deep breath. It was a minor pleuritic component. Symptoms began on 1/30. For the past 3 weeks patient has felt progressive weakness and has had issues with difficulty with choking while swallowing liquids and foods. He is not  had any fevers. He does report that over the past few weeks he's had productive yellow sputum worse over the past 2 days. He reports he did receive a flu shot. He has not had any weight gain or lower extremity edema, orthopnea or dyspnea on exertion. He reports for the past 2 days his dysarthria slightly worsened and he seems to be slightly more worse than baseline regarding his right-sided weakness  ER Evaluation and treatment: Afebrile, moderately hypertensive at presentation with a blood pressure 162/101 with repeat blood pressure 159/97, no hypoxemia PCXR: Minimal bilateral pleural effusions with minimal right middle lobe airspace opacity which maybe present mild inflammation or atelectasis EKG: Sinus rhythm with ventricular rate 81 these per minute, QTC 493 ms, voltage criteria are met for LVH, no acute ischemic changes, T-wave inversions V5 V6 Laboratory data: Sodium 132 with glucose 297, creatinine 1.36, BNP 1767, troponin 0.03, WBC  8000, hemoglobin 12.9, platelet 169,000 Saline bolus 500 mL  Review of Systems   In addition to the HPI above,  No Fever-chills, myalgias or other constitutional symptoms No Headache, changes with Vision or hearing, new weakness, tingling, numbness in any extremity, No problems swallowing food or Liquids, indigestion/reflux No Chest pain, palpitations, orthopnea or DOE No Abdominal pain, N/V; no melena or hematochezia, no dark tarry stools No dysuria, hematuria or flank pain No new skin rashes, lesions, masses or bruises, No new joints pains-aches No recent weight gain or loss No polyuria, polydypsia or polyphagia,  *A full 10 point Review of Systems was done, except as stated above, all other Review of Systems were negative.  Social History Social History  Substance Use Topics  . Smoking status: Never Smoker   . Smokeless tobacco: Never Used  . Alcohol Use: No    Resides at: Private residence  Lives with: His partner  Ambulatory status: Cane   Family History Family History  Problem Relation Age of Onset  . Heart disease Mother   . Diabetes Mother   . Cancer Mother   . Heart attack Father   . Stroke Sister     ICH  . Diabetes Sister      Prior to Admission medications   Medication Sig Start Date End Date Taking? Authorizing Provider  aspirin EC 81 MG tablet Take 1 tablet by mouth daily. 08/12/12  Yes Historical Provider, MD  atorvastatin (LIPITOR) 20 MG tablet Take 1 tablet by mouth daily. 06/15/13  Yes Historical Provider, MD  citalopram (CELEXA) 10 MG tablet Take 1 tablet by mouth daily.   Yes Historical Provider, MD  clopidogrel (PLAVIX) 75 MG tablet Take 1 tablet by mouth daily. 06/15/13  Yes Historical Provider, MD  Darunavir Ethanolate (PREZISTA) 800 MG tablet Take 1 tablet by mouth daily.   Yes Historical Provider, MD  dolutegravir (TIVICAY) 50 MG tablet Take 1 tablet by mouth daily.   Yes Historical Provider, MD  emtricitabine-tenofovir (TRUVADA) 200-300 MG  per tablet Take 1 tablet by mouth daily.   Yes Historical Provider, MD  gabapentin (NEURONTIN) 300 MG capsule One capsule at night for 2 weeks, then take one capsule twice a day Patient taking differently: Take 300 mg by mouth 2 (two) times daily.  12/22/14  Yes Kathrynn Ducking, MD  insulin aspart (NOVOLOG) 100 UNIT/ML injection Inject 6 Units into the skin 3 (three) times daily with meals. 6 units is the base. Add 2 units for each 15gm of carbs   Yes Historical Provider, MD  isosorbide mononitrate (IMDUR) 30 MG 24 hr tablet  Take 0.5 tablets (15 mg total) by mouth daily. 11/07/14  Yes Pixie Casino, MD  levothyroxine (SYNTHROID, LEVOTHROID) 75 MCG tablet Take 225 mcg by mouth daily.   Yes Historical Provider, MD  metFORMIN (GLUCOPHAGE-XR) 500 MG 24 hr tablet Take 1 tablet (500 mg total) by mouth 2 (two) times daily. 04/20/15  Yes Rogelia Mire, NP  metoprolol succinate (TOPROL XL) 25 MG 24 hr tablet Take 1 tablet (25 mg total) by mouth daily. 08/08/14  Yes Pixie Casino, MD  pantoprazole (PROTONIX) 40 MG tablet Take 1 tablet by mouth daily.   Yes Historical Provider, MD  ritonavir (NORVIR) 100 MG capsule Take 1 capsule by mouth daily.   Yes Historical Provider, MD  nitroGLYCERIN (NITROSTAT) 0.4 MG SL tablet Place 1 tablet (0.4 mg total) under the tongue every 5 (five) minutes as needed for chest pain. 04/20/15   Rogelia Mire, NP    Allergies  Allergen Reactions  . Penicillins Anaphylaxis  . Codeine Nausea Only    Physical Exam  Vitals  Blood pressure 159/97, pulse 74, temperature 98.7 F (37.1 C), temperature source Oral, resp. rate 18, height 5' 8"  (1.727 m), weight 160 lb (72.576 kg), SpO2 98 %.   General:  In no acute distress, appears slightly older than stated age  Psych:  Normal affect, Denies Suicidal or Homicidal ideations, Awake Alert, Oriented X 3. Speech and thought patterns are clear and appropriate, no apparent short term memory deficits  Neuro:   No focal  neurological deficits, CN II through XII intact except for persistent right facial drooping and mild dysarthria, Strength 5/5 left side and 4/5 right side, Sensation intact all 4 extremities.  ENT:  Ears and Eyes appear Normal, Conjunctivae clear, PER. Moist oral mucosa without erythema or exudates.  Neck:  Supple, No lymphadenopathy appreciated  Respiratory:  Symmetrical chest wall movement, Good air movement bilaterally, CTAB. Room Air  Cardiac:  RRR, No Murmurs, no LE edema noted, no JVD, No carotid bruits, peripheral pulses palpable at 2+  Abdomen:  Positive bowel sounds, Soft, Non tender, Non distended,  No masses appreciated, no obvious hepatosplenomegaly  Skin:  No Cyanosis, Normal Skin Turgor, No Skin Rash or Bruise.  Extremities: Symmetrical without obvious trauma or injury,  no effusions.  Data Review  CBC  Recent Labs Lab 06/27/15 1137  WBC 8.0  HGB 12.9*  HCT 38.3*  PLT 169  MCV 86.1  MCH 29.0  MCHC 33.7  RDW 12.3  LYMPHSABS 1.7  MONOABS 0.4  EOSABS 0.0  BASOSABS 0.0    Chemistries   Recent Labs Lab 06/27/15 1137  NA 132*  K 4.3  CL 96*  CO2 25  GLUCOSE 297*  BUN 18  CREATININE 1.36*  CALCIUM 9.1  AST 30  ALT 27  ALKPHOS 132*  BILITOT 1.6*    estimated creatinine clearance is 57.3 mL/min (by C-G formula based on Cr of 1.36).  No results for input(s): TSH, T4TOTAL, T3FREE, THYROIDAB in the last 72 hours.  Invalid input(s): FREET3  Coagulation profile  Recent Labs Lab 06/27/15 1137  INR 1.04    No results for input(s): DDIMER in the last 72 hours.  Cardiac Enzymes No results for input(s): CKMB, TROPONINI, MYOGLOBIN in the last 168 hours.  Invalid input(s): CK  Invalid input(s): POCBNP  Urinalysis    Component Value Date/Time   COLORURINE YELLOW 09/19/2014 0325   APPEARANCEUR CLEAR 09/19/2014 0325   LABSPEC 1.010 09/19/2014 0325   PHURINE 6.5 09/19/2014 0325  GLUCOSEU NEGATIVE 09/19/2014 0325   HGBUR NEGATIVE 09/19/2014  0325   BILIRUBINUR NEGATIVE 09/19/2014 0325   KETONESUR NEGATIVE 09/19/2014 0325   PROTEINUR 30* 09/19/2014 0325   UROBILINOGEN 0.2 09/19/2014 0325   NITRITE NEGATIVE 09/19/2014 0325   LEUKOCYTESUR NEGATIVE 09/19/2014 0325    Imaging results:   Dg Chest 2 View  06/27/2015  CLINICAL DATA:  Shortness of breath. EXAM: CHEST  2 VIEW COMPARISON:  April 19, 2015. FINDINGS: Stable cardiomediastinal silhouette. Status post coronary artery bypass graft. No pneumothorax is noted. Minimal bilateral pleural effusions are noted. Left lung is clear. Minimal airspace opacity is noted in right middle lobe which may represent mild inflammation or atelectasis. Bony thorax is unremarkable. IMPRESSION: Minimal bilateral pleural effusions. Minimal right middle lobe airspace opacity is noted which may represent mild inflammation or atelectasis. Electronically Signed   By: Marijo Conception, M.D.   On: 06/27/2015 13:09     EKG: (Independently reviewed)  Sinus rhythm with ventricular rate 81 these per minute, QTC 493 ms, voltage criteria are met for LVH, no acute ischemic changes, T-wave inversions V5 V6   Assessment & Plan  Principal Problem:   Acute respiratory distress/Aspiration pneumonia  -Given patient's report of choking while eating and a vague right middle lobe density suspect symptoms related to aspiration -Tele/ Obs -Empiric Zithromax and due to penicillin allergy clindamycin-since HIV positive and higher risk for complications if would develop C. difficile colitis we'll dose clindamycin at 600 every 12 hours and discontinue patient's preadmission PPI -With history of HIV we'll check LDH to make sure this is not a typical presentation of PCP (patient reports has had twice in the past) -Encourage the patient not hypoxemic  Active Problems:   Human immunodeficiency virus (HIV) disease  -Followed by ID clinic at [facility] -In October for viral load was 6000 and patient admitted to missing some doses of his  medications during that time. Noting his mother and stepfather were ill -Ck CD4 CD8 as well as HIV on a quantitative -Continue preadmission ARTs    CKD 3 -Reacting at baseline    History of CVA  -Persistent right-sided weakness requiring cane -Now with symptoms concerning for dysphagia and likely aspiration -Have ordered a D3 nectar thick liquid diet -SLP evaluation    S/P CABG x 5/Coronary artery disease involving coronary bypass graft of native heart with unstable angina pectoris    -Currently asymptomatic -Continue preadmission Plavix, aspirin, statin and beta blocker and Imdur    Bilateral pleural effusion/ Ischemic cardiomyopathy-EF 25% -Very tiny bilateral effusions not associated with weight gain, lower extremity edema, dyspnea on exertion or orthopnea -BNP elevated but patient does not have typical symptoms of heart failure -Not on diuretics or ACE/ARB secondary to previous syncope from these medications -repeat BNP in am    Hypothyroidism -Continue Synthroid    Insulin dependent type 2 diabetes mellitus, uncontrolled  -States is no longer on metformin -Also reports episodic hypoglycemia longer acting insulins -At-home utilizes 6 units meal coverage plus a sliding scale -For now will utilize sliding scale only and adjust accordingly    Diabetic neuropathy  -Continue Neurontin    DVT Prophylaxis: Lovenox  Family Communication:   Partner not at bedside  Code Status:  Full code  Condition:  Stable  Discharge disposition: Anticipate discharge back to previous home environment after workup for possible aspiration pneumonia and dysphagia  Time spent in minutes : 60      Eliyana Pagliaro L. ANP on 06/27/2015 at 3:34 PM  You  may contact me by going to [email or web address] - password [email or web address]  I am available from 7a-7p but please confirm I am on the schedule by going to Amion as above.   After 7p please contact night coverage person covering me after hours  [facility]

## 2015-06-27 NOTE — ED Notes (Signed)
Provider at bedside

## 2015-06-27 NOTE — ED Notes (Signed)
Onset one day ago felt warm to touch difficult to take a deep inspiration continued today with general body achy and productive cough yellow.

## 2015-06-27 NOTE — ED Provider Notes (Signed)
CSN: [id number]     Arrival date & time 06/27/15  1033 History   First MD Initiated Contact with Patient 06/27/15 1039     Chief Complaint  Patient presents with  . Shortness of Breath   (Consider location/radiation/quality/duration/timing/severity/associated sxs/prior Treatment) The history is provided by the patient and the spouse. No language interpreter was used.    [name] is a 60 y.o male with a past medical history of diabetes, CAD, CHF, hyperlipidemia, karposi sarcoma, hypertension, stroke, and HIV who presents for faculty with inspiration for the past 2 days. He states he feels like he is coming down with the flu. He recently drove to [place] 4 days ago and drove back last night. He takes Plavix for previous stroke and MI. His partner states that he has been slower to respond since the strokes but has been even more slower in the past 4 days since returning from his trip to [place] to see his mother. Last CD4 count was 600 and undetectable viral load.   Past Medical History  Diagnosis Date  . Chronic systolic CHF (congestive heart failure) ([place])     a. 12/2013 Echo: EF 45%;  b. 03/2015 Echo: EF 35%, mild MR, mildly dil LA.  Marland Kitchen Hyperlipidemia   . Hypertension   . Syncope and collapse     a. 12/2013 s/p MDT Linq ILR.  Marland Kitchen HIV (human immunodeficiency virus infection) ([place]) a. Dx in 1980's.  . Coronary artery disease     a. s/p prior CABG;  b. 03/2015 NSTEMI/PCI: 100ost CTO, LAD 100ost/p, 45d, D1 100/95ost, RI 75, LCX 100p, OM1/3 small, RCA 100ost->distal, RPDA/RPAV small,RPA1 80, RPA2 90, RPLB 80, RPLB2 80,  VG->D1->RI 10p, patent stents, VG->RPDA->RPLB3 99 before RPDA (3.5x24 Promus DES), 100 between PDA& RPLB3 (CTO), LIMA->LAD nl.  . Kidney stones   . 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 NSTEMI (non-ST elevated myocardial infarction) ([place]) 03/2015  . MI (myocardial infarction) ([place])     "I've had 4" (06/27/2015)  . Anginal pain ([place])   . Hypothyroidism   . Type  II diabetes mellitus ([place])   . Diabetic neuropathy ([place]) 12/22/2014  . GERD (gastroesophageal reflux disease)   . History of hiatal hernia   . History of duodenal ulcer   . Stroke [facility])     a. 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  Marland Kitchen Anxiety   . Pneumonia "several times"  . Aspiration pneumonia ([place]) 06/27/2015   Past Surgical History  Procedure Laterality Date  . Loop recorder implant      Medtronic Reveal Model # [id number] MR safe  . Cystoscopy w/ stone manipulation  1980s  . Coronary artery bypass graft  1980s    "CABG X5; Dr. Lia Foyer"  . Cardiac catheterization N/A 04/19/2015    Procedure: Left Heart Cath and Coronary Angiography;  Surgeon: Leonie Man, MD;  Location: [place] [facility];  Service: Cardiovascular;  Laterality: N/A;  . Cardiac catheterization N/A 04/19/2015    Procedure: Coronary Stent Intervention;  Surgeon: Leonie Man, MD;  Location: [place] [facility];  Service: Cardiovascular;  Laterality: N/A;  . Coronary angioplasty with stent placement  "3-4 times"    "I've got several stents"   Family History  Problem Relation Age of Onset  . Heart disease Mother   . Diabetes Mother   . Cancer Mother   . Heart attack Father   . Stroke Sister     ICH  . Diabetes Sister  Social History  Substance Use Topics  . Smoking status: Never Smoker   . Smokeless tobacco: Never Used  . Alcohol Use: No    Review of Systems  Constitutional: Negative for fever and chills.  Respiratory: Positive for cough and shortness of breath. Negative for chest tightness.   Cardiovascular: Negative for chest pain and leg swelling.  All other systems reviewed and are negative.     Allergies  Penicillins and Codeine  Home Medications   Prior to Admission medications   Medication Sig Start Date End Date Taking? Authorizing Provider  aspirin EC 81 MG tablet Take 1 tablet by mouth daily. 08/12/12  Yes Historical Provider, MD  atorvastatin (LIPITOR) 20 MG tablet  Take 1 tablet by mouth daily. 06/15/13  Yes Historical Provider, MD  citalopram (CELEXA) 10 MG tablet Take 1 tablet by mouth daily.   Yes Historical Provider, MD  clopidogrel (PLAVIX) 75 MG tablet Take 1 tablet by mouth daily. 06/15/13  Yes Historical Provider, MD  Darunavir Ethanolate (PREZISTA) 800 MG tablet Take 1 tablet by mouth daily.   Yes Historical Provider, MD  dolutegravir (TIVICAY) 50 MG tablet Take 1 tablet by mouth daily.   Yes Historical Provider, MD  emtricitabine-tenofovir (TRUVADA) 200-300 MG per tablet Take 1 tablet by mouth daily.   Yes Historical Provider, MD  gabapentin (NEURONTIN) 300 MG capsule One capsule at night for 2 weeks, then take one capsule twice a day Patient taking differently: Take 300 mg by mouth 2 (two) times daily.  12/22/14  Yes Kathrynn Ducking, MD  insulin aspart (NOVOLOG) 100 UNIT/ML injection Inject 6 Units into the skin 3 (three) times daily with meals. 6 units is the base. Add 2 units for each 15gm of carbs   Yes Historical Provider, MD  isosorbide mononitrate (IMDUR) 30 MG 24 hr tablet Take 0.5 tablets (15 mg total) by mouth daily. 11/07/14  Yes Pixie Casino, MD  levothyroxine (SYNTHROID, LEVOTHROID) 75 MCG tablet Take 225 mcg by mouth daily.   Yes Historical Provider, MD  metFORMIN (GLUCOPHAGE-XR) 500 MG 24 hr tablet Take 1 tablet (500 mg total) by mouth 2 (two) times daily. 04/20/15  Yes Rogelia Mire, NP  metoprolol succinate (TOPROL XL) 25 MG 24 hr tablet Take 1 tablet (25 mg total) by mouth daily. 08/08/14  Yes Pixie Casino, MD  pantoprazole (PROTONIX) 40 MG tablet Take 1 tablet by mouth daily.   Yes Historical Provider, MD  ritonavir (NORVIR) 100 MG capsule Take 1 capsule by mouth daily.   Yes Historical Provider, MD  nitroGLYCERIN (NITROSTAT) 0.4 MG SL tablet Place 1 tablet (0.4 mg total) under the tongue every 5 (five) minutes as needed for chest pain. 04/20/15   Rogelia Mire, NP   BP 159/97 mmHg  Pulse 74  Temp(Src) 98.7 F (37.1  C) (Oral)  Resp 18  Ht 5\' 8"  (1.727 m)  Wt 72.576 kg  BMI 24.33 kg/m2  SpO2 98% Physical Exam  Constitutional: He is oriented to person, place, and time. He appears well-developed and well-nourished. No distress.  HENT:  Head: Normocephalic and atraumatic.  Eyes: Conjunctivae are normal.  Neck: Normal range of motion. Neck supple.  Cardiovascular: Normal rate, regular rhythm and normal heart sounds.   No murmur heard. Regular rate and rhythm. No murmurs or gallops.  Pulmonary/Chest: Effort normal and breath sounds normal. No respiratory distress. He has no wheezes.  Decreased breath sounds bilaterally. No wheezing. No respiratory distress.   Abdominal: Soft. He exhibits no distension.  There is no tenderness. There is no rebound and no guarding.  No abdominal tenderness.  Musculoskeletal: Normal range of motion.  No lower extremity swelling or tenderness.  Neurological: He is alert and oriented to person, place, and time. He has normal strength. No sensory deficit. GCS eye subscore is 4. GCS verbal subscore is 5. GCS motor subscore is 6.  Slow to respond to questions but is appropriate. Residual right-sided weakness in the upper extremity from previous stroke. No sensory deficit. Appears weak.  Skin: Skin is warm and dry.  Nursing note and vitals reviewed.   ED Course  Procedures (including critical care time) Labs Review Labs Reviewed  CBC WITH DIFFERENTIAL/PLATELET - Abnormal; Notable for the following:    Hemoglobin 12.9 (*)    HCT 38.3 (*)    All other components within normal limits  COMPREHENSIVE METABOLIC PANEL - Abnormal; Notable for the following:    Sodium 132 (*)    Chloride 96 (*)    Glucose, Bld 297 (*)    Creatinine, Ser 1.36 (*)    Alkaline Phosphatase 132 (*)    Total Bilirubin 1.6 (*)    GFR calc non Af Amer 56 (*)    All other components within normal limits  BRAIN NATRIURETIC PEPTIDE - Abnormal; Notable for the following:    B Natriuretic Peptide 1767.6  (*)    All other components within normal limits  GLUCOSE, CAPILLARY - Abnormal; Notable for the following:    Glucose-Capillary 220 (*)    All other components within normal limits  RESPIRATORY VIRUS PANEL  CULTURE, BLOOD (ROUTINE X 2)  CULTURE, BLOOD (ROUTINE X 2)  PROTIME-INR  CD4/CD8 (T-HELPER/T-SUPPRESSOR CELL)  LACTATE DEHYDROGENASE  HIV 1 RNA QUANT-NO REFLEX-BLD  INFLUENZA PANEL BY PCR (TYPE A & B, H1N1)  HEMOGLOBIN A1C  I-STAT TROPOININ, ED    Imaging Review Dg Chest 2 View  06/27/2015  CLINICAL DATA:  Shortness of breath. EXAM: CHEST  2 VIEW COMPARISON:  April 19, 2015. FINDINGS: Stable cardiomediastinal silhouette. Status post coronary artery bypass graft. No pneumothorax is noted. Minimal bilateral pleural effusions are noted. Left lung is clear. Minimal airspace opacity is noted in right middle lobe which may represent mild inflammation or atelectasis. Bony thorax is unremarkable. IMPRESSION: Minimal bilateral pleural effusions. Minimal right middle lobe airspace opacity is noted which may represent mild inflammation or atelectasis. Electronically Signed   By: Marijo Conception, M.D.   On: 06/27/2015 13:09   I have personally reviewed and evaluated these images and lab results as part of my medical decision-making.   EKG Interpretation None      MDM   Final diagnoses:  Acute congestive heart failure, unspecified congestive heart failure type (HCC)  Shortness of breath  Bilateral pleural effusion   Patient with significant past medical history presents for difficulty taking a deep breath and productive yellow sputum cough 2 days. Partner reports that he is slower to respond then he was 4 days ago. He has some residual slowness to respond and right-sided weakness secondary to a stroke. She has a blood glucose of 298 but normal anion gap. His creatinine slightly elevated at 1.36. BMP is 1700 and chest x-ray shows bilateral pleural effusions but no pneumonia or  pneumothorax. This is most likely causing his shortness of breath and inability to take a deep breath. I discussed findings with the patient and his partner. I also explained that he would need admission into the hospital. They agree. Patient is stable and still 98%  on room air.  I discussed this patient with the NP Erin Hearing to admit the patient under Dr. Marily Memos for the above diagnoses.     Ottie Glazier, PA-C 06/27/15 [place], MD 06/28/15 [phone]

## 2015-06-28 DIAGNOSIS — R0602 Shortness of breath: Secondary | ICD-10-CM | POA: Diagnosis present

## 2015-06-28 DIAGNOSIS — E785 Hyperlipidemia, unspecified: Secondary | ICD-10-CM | POA: Diagnosis present

## 2015-06-28 DIAGNOSIS — Z951 Presence of aortocoronary bypass graft: Secondary | ICD-10-CM | POA: Diagnosis not present

## 2015-06-28 DIAGNOSIS — E11649 Type 2 diabetes mellitus with hypoglycemia without coma: Secondary | ICD-10-CM | POA: Diagnosis present

## 2015-06-28 DIAGNOSIS — E1165 Type 2 diabetes mellitus with hyperglycemia: Secondary | ICD-10-CM | POA: Diagnosis present

## 2015-06-28 DIAGNOSIS — I130 Hypertensive heart and chronic kidney disease with heart failure and stage 1 through stage 4 chronic kidney disease, or unspecified chronic kidney disease: Secondary | ICD-10-CM | POA: Diagnosis present

## 2015-06-28 DIAGNOSIS — N183 Chronic kidney disease, stage 3 (moderate): Secondary | ICD-10-CM | POA: Diagnosis present

## 2015-06-28 DIAGNOSIS — I25110 Atherosclerotic heart disease of native coronary artery with unstable angina pectoris: Secondary | ICD-10-CM | POA: Diagnosis present

## 2015-06-28 DIAGNOSIS — R0600 Dyspnea, unspecified: Secondary | ICD-10-CM

## 2015-06-28 DIAGNOSIS — B20 Human immunodeficiency virus [HIV] disease: Secondary | ICD-10-CM | POA: Diagnosis present

## 2015-06-28 DIAGNOSIS — I255 Ischemic cardiomyopathy: Secondary | ICD-10-CM | POA: Diagnosis present

## 2015-06-28 DIAGNOSIS — I252 Old myocardial infarction: Secondary | ICD-10-CM | POA: Diagnosis not present

## 2015-06-28 DIAGNOSIS — E1122 Type 2 diabetes mellitus with diabetic chronic kidney disease: Secondary | ICD-10-CM | POA: Diagnosis present

## 2015-06-28 DIAGNOSIS — Z794 Long term (current) use of insulin: Secondary | ICD-10-CM | POA: Diagnosis not present

## 2015-06-28 DIAGNOSIS — Z885 Allergy status to narcotic agent status: Secondary | ICD-10-CM | POA: Diagnosis not present

## 2015-06-28 DIAGNOSIS — I5022 Chronic systolic (congestive) heart failure: Secondary | ICD-10-CM | POA: Diagnosis present

## 2015-06-28 DIAGNOSIS — Z880 Allergy status to penicillin: Secondary | ICD-10-CM | POA: Diagnosis not present

## 2015-06-28 DIAGNOSIS — J690 Pneumonitis due to inhalation of food and vomit: Secondary | ICD-10-CM | POA: Diagnosis not present

## 2015-06-28 DIAGNOSIS — E039 Hypothyroidism, unspecified: Secondary | ICD-10-CM | POA: Diagnosis present

## 2015-06-28 DIAGNOSIS — E1140 Type 2 diabetes mellitus with diabetic neuropathy, unspecified: Secondary | ICD-10-CM | POA: Diagnosis present

## 2015-06-28 DIAGNOSIS — I69351 Hemiplegia and hemiparesis following cerebral infarction affecting right dominant side: Secondary | ICD-10-CM | POA: Diagnosis not present

## 2015-06-28 LAB — BASIC METABOLIC PANEL
Anion gap: 9 (ref 5–15)
BUN: 17 mg/dL (ref 6–20)
CHLORIDE: 97 mmol/L — AB (ref 101–111)
CO2: 26 mmol/L (ref 22–32)
CREATININE: 1.21 mg/dL (ref 0.61–1.24)
Calcium: 8.7 mg/dL — ABNORMAL LOW (ref 8.9–10.3)
GFR calc Af Amer: >60 mL/min (ref >60)
GFR calc non Af Amer: >60 mL/min (ref >60)
GLUCOSE: 142 mg/dL — AB (ref 65–99)
Potassium: 3.5 mmol/L (ref 3.5–5.1)
Sodium: 132 mmol/L — ABNORMAL LOW (ref 135–145)

## 2015-06-28 LAB — CD4/CD8 (T-HELPER/T-SUPPRESSOR CELL)
CD4 ABSOLUTE: 410 /uL — AB (ref 500–1900)
CD4%: 18 % — AB (ref 30.0–60.0)
CD8 T CELL ABS: 1080 /uL — AB (ref 230–1000)
CD8tox: 48 % — ABNORMAL HIGH (ref 15.0–40.0)
RATIO: 0.38 — AB (ref 1.0–3.0)
Total lymphocyte count: 2240 /uL (ref 1000–4000)

## 2015-06-28 LAB — GLUCOSE, CAPILLARY
GLUCOSE-CAPILLARY: 130 mg/dL — AB (ref 65–99)
GLUCOSE-CAPILLARY: 156 mg/dL — AB (ref 65–99)
Glucose-Capillary: 107 mg/dL — ABNORMAL HIGH (ref 65–99)
Glucose-Capillary: 238 mg/dL — ABNORMAL HIGH (ref 65–99)
Glucose-Capillary: 226 mg/dL — ABNORMAL HIGH (ref 65–99)

## 2015-06-28 LAB — HEMOGLOBIN A1C
Hgb A1c MFr Bld: 11.8 % — ABNORMAL HIGH (ref 4.8–5.6)
Mean Plasma Glucose: 292 mg/dL

## 2015-06-28 LAB — HIV-1 RNA QUANT-NO REFLEX-BLD
HIV 1 RNA Quant: 950 copies/mL
LOG10 HIV-1 RNA: 2.978 log10copy/mL

## 2015-06-28 LAB — PREALBUMIN: Prealbumin: 13.8 mg/dL — ABNORMAL LOW (ref 18–38)

## 2015-06-28 LAB — STREP PNEUMONIAE URINARY ANTIGEN: STREP PNEUMO URINARY ANTIGEN: NEGATIVE

## 2015-06-28 LAB — BRAIN NATRIURETIC PEPTIDE: B Natriuretic Peptide: 1071.7 pg/mL — ABNORMAL HIGH (ref 0.0–100.0)

## 2015-06-28 MED ORDER — CLINDAMYCIN PHOSPHATE 600 MG/50ML IV SOLN
600.0000 mg | Freq: Three times a day (TID) | INTRAVENOUS | Status: DC
  Administered 2015-06-28 – 2015-06-29 (×3): 600 mg via INTRAVENOUS
  Filled 2015-06-28 (×3): qty 50

## 2015-06-28 MED ORDER — INSULIN ASPART 100 UNIT/ML ~~LOC~~ SOLN
3.0000 [IU] | Freq: Three times a day (TID) | SUBCUTANEOUS | Status: DC
  Administered 2015-06-28 – 2015-06-29 (×3): 3 [IU] via SUBCUTANEOUS

## 2015-06-28 NOTE — Hospital Discharge Follow-Up (Signed)
Transitional Care Clinic Care Coordination Note:  Admit date:  06/27/15 Discharge date: TBD Discharge Disposition: Home when stable Patient contact: [phone] (mobile) Emergency contact(s): Helayne Seminole (significant other)-[phone]  This Case Manager reviewed patient's EMR and determined patient would benefit from post-discharge medical management and chronic care management services through the [facility]. Patient has a history of CHF, CKD stage 3, uncontrolled diabetes mellitus, HIV. Admitted with acute respiratory distress, aspiration pneumonia.  This Case Manager met with patient to discuss the services and medical management that can be provided at the [facility]. Patient verbalized understanding and agreed to receive post-discharge care at the [facility].   Patient scheduled for Transitional Care appointment on 07/03/15 at 1400 with Dr. Jarold Song.  Clinic information and appointment time provided to patient. Appointment information also placed on AVS.  Assessment:       Home Environment: Patient lives with his significant other, Joneen Caraway, in a private residence.       Support System: significant other, friends       Level of functioning: Independent       Home DME: four point cane for stability with ambulation. Patient also indicated he has a scale at home as well as a glucometer and diabetes monitoring supplies.       Home care services: none       Transportation: Patient indicated he typically drives to his medical appointments.        Food/Nutrition: Patient indicated he has access to needed food.        Medications: Patient indicated he gets his medications from [facility] on [address]. He denied problems obtaining or affording his medications.        Identified Barriers: lack of PCP        PCP: none. Will need to establish with PCP at [facility] after 30 days of medical management with the [facility].      Arranged services:   Services communicated to Marvetta Gibbons, RN CM

## 2015-06-28 NOTE — Progress Notes (Signed)
[facility] PROGRESS NOTE  Jackson Shaw [id number] DOB: December 28, 1955 DOA: 06/27/2015 PCP: Pcp Not In System  Assessment/Plan: Acute respiratory distress/Aspiration pneumonia  - Zithromax and clindamycin- -With history of HIV we'll check LDH to make sure this is not a typical presentation of PCP. LDL normal.  -speech evaluation for diet recomemndation -CD 4  Absolute at 410.    Human immunodeficiency virus (HIV) disease  -Followed by ID clinic at [facility] -In October for viral load was 6000 and patient admitted to missing some doses of his medications during that time. Noting his mother and stepfather were ill -Ck CD4 absolute at 76 -Continue preadmission ARTs   CKD 3 -cr at baseline    History of CVA  -Persistent right-sided weakness requiring cane -denies worsening of symptoms, right side weakness.  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 pectoris  -Currently asymptomatic -Continue preadmission Plavix, aspirin, statin and beta blocker and Imdur   Bilateral pleural effusion/ Ischemic cardiomyopathy-EF 25% -Very tiny bilateral effusions not associated with weight gain, lower extremity edema, dyspnea on exertion or orthopnea -BNP elevated but patient does not have typical symptoms of heart failure -Not on diuretics or ACE/ARB secondary to previous syncope from these medications -repeat BNP in am   Hypothyroidism -Continue Synthroid   Insulin dependent type 2 diabetes mellitus, uncontrolled  -States is no longer on metformin -Also reports episodic hypoglycemia longer acting insulins -will add meal coverage. CBG increasing.    Diabetic neuropathy  -Continue Neurontin  Code Status: full code Family Communication: care discussed with patient.  Disposition Plan: remain inpatient for IV antibiotics, Speech evaluation     Consultants:  none  Procedures:  none  Antibiotics:  Clindamycin 2-01  Azithromycin 1-31  HPI/Subjective: He is feeling better, breathing better. He doesn't like the thickening to fluids.  He feels right side weakness is the same , not worsening of his symptoms.   Objective: Filed Vitals:   06/28/15 0541 06/28/15 1348  BP: 115/67 140/76  Pulse: 59 73  Temp: 97.9 F (36.6 C)   Resp: 18 18    Intake/Output Summary (Last 24 hours) at 06/28/15 1409 Last data filed at 06/28/15 1300  Gross per 24 hour  Intake    600 ml  Output    400 ml  Net    200 ml   Filed Weights   06/27/15 1052  Weight: 72.576 kg (160 lb)    Exam:   General:  Alert in no acute distress  Cardiovascular: S 1, S 2 RRR  Respiratory: Crackles bases  Abdomen: BS present, soft, nt  Musculoskeletal: no edema  Data Reviewed: Basic Metabolic Panel:  Recent Labs Lab 06/27/15 1137 06/28/15 0400  NA 132* 132*  K 4.3 3.5  CL 96* 97*  CO2 25 26  GLUCOSE 297* 142*  BUN 18 17  CREATININE 1.36* 1.21  CALCIUM 9.1 8.7*   Liver Function Tests:  Recent Labs Lab 06/27/15 1137  AST 30  ALT 27  ALKPHOS 132*  BILITOT 1.6*  PROT 7.6  ALBUMIN 3.5   No results for input(s): LIPASE, AMYLASE in the last 168 hours. No results for input(s): AMMONIA in the last 168 hours. CBC:  Recent Labs Lab 06/27/15 1137 06/27/15 1910  WBC 8.0 7.0  NEUTROABS 5.8 4.0  HGB 12.9* 12.4*  HCT 38.3* 35.9*  MCV 86.1 86.1  PLT 169 168   Cardiac Enzymes: No results for input(s): CKTOTAL, CKMB, CKMBINDEX, TROPONINI  in the last 168 hours. BNP (last 3 results)  Recent Labs  06/27/15 1130 06/28/15 0400  BNP 1767.6* 1071.7*    ProBNP (last 3 results) No results for input(s): PROBNP in the last 8760 hours.  CBG:  Recent Labs Lab 06/27/15 1634 06/27/15 2121 06/28/15 0626 06/28/15 1201  GLUCAP 220* 284* 107* 238*    Recent Results (from the past 240 hour(s))  Culture, blood (Routine X 2)  w Reflex to ID Panel     Status: None (Preliminary result)   Collection Time: 06/27/15  4:00 PM  Result Value Ref Range Status   Specimen Description BLOOD RIGHT ANTECUBITAL  Final   Special Requests   Final    BOTTLES DRAWN AEROBIC AND ANAEROBIC 10CC BLUE, 5CC RED   Culture NO GROWTH < 24 HOURS  Final   Report Status PENDING  Incomplete  Culture, blood (Routine X 2) w Reflex to ID Panel     Status: None (Preliminary result)   Collection Time: 06/27/15  4:29 PM  Result Value Ref Range Status   Specimen Description BLOOD RIGHT FOREARM  Final   Special Requests BOTTLES DRAWN AEROBIC AND ANAEROBIC 5CC  Final   Culture NO GROWTH < 24 HOURS  Final   Report Status PENDING  Incomplete     Studies: Dg Chest 2 View  06/27/2015  CLINICAL DATA:  Shortness of breath. EXAM: CHEST  2 VIEW COMPARISON:  April 19, 2015. FINDINGS: Stable cardiomediastinal silhouette. Status post coronary artery bypass graft. No pneumothorax is noted. Minimal bilateral pleural effusions are noted. Left lung is clear. Minimal airspace opacity is noted in right middle lobe which may represent mild inflammation or atelectasis. Bony thorax is unremarkable. IMPRESSION: Minimal bilateral pleural effusions. Minimal right middle lobe airspace opacity is noted which may represent mild inflammation or atelectasis. Electronically Signed   By: Marijo Conception, M.D.   On: 06/27/2015 13:09    Scheduled Meds: . aspirin EC  81 mg Oral Daily  . atorvastatin  20 mg Oral Daily  . azithromycin  500 mg Intravenous Q24H  . citalopram  10 mg Oral Daily  . clindamycin (CLEOCIN) IV  600 mg Intravenous Q8H  . clopidogrel  75 mg Oral Daily  . Darunavir Ethanolate  800 mg Oral Q breakfast  . dolutegravir  50 mg Oral Daily  . emtricitabine-tenofovir AF  1 tablet Oral Daily  . enoxaparin (LOVENOX) injection  40 mg Subcutaneous Q24H  . feeding supplement (ENSURE ENLIVE)  237 mL Oral BID BM  . gabapentin  300 mg Oral BID  . insulin aspart  0-5  Units Subcutaneous QHS  . insulin aspart  0-9 Units Subcutaneous TID WC  . isosorbide mononitrate  15 mg Oral Daily  . levothyroxine  225 mcg Oral QAC breakfast  . metoprolol succinate  25 mg Oral Daily  . ritonavir  100 mg Oral Q breakfast   Continuous Infusions:   Principal Problem:   Aspiration pneumonia (HCC) Active Problems:   Human immunodeficiency virus (HIV) disease (HCC)   Hypothyroidism   Insulin dependent type 2 diabetes mellitus, uncontrolled ([place])   S/P CABG x 5   Diabetic neuropathy (HCC)   History of CVA (cerebrovascular accident)   Coronary artery disease involving coronary bypass graft of native heart with unstable angina pectoris (HCC)   Bilateral pleural effusion   Acute respiratory distress (HCC)   Ischemic cardiomyopathy/EF 25%   Aspiration pneumonia of right middle lobe ([place])    Time spent: 35 minutes.  Niel Hummer A  [facility] Pager [phone]. If 7PM-7AM, please contact night-coverage at [email or web address], password [facility] 06/28/2015, 2:09 PM  LOS: 1 day

## 2015-06-28 NOTE — Evaluation (Signed)
Clinical/Bedside Swallow Evaluation Patient Details  Name: Jackson Shaw MRN: [id number] Date of Birth: 1955-08-24  Today's Date: 06/28/2015 Time: SLP Start Time (ACUTE ONLY): [id number] SLP Stop Time (ACUTE ONLY): 1439 SLP Time Calculation (min) (ACUTE ONLY): 24 min  Past Medical History:  Past Medical History  Diagnosis Date  . Chronic systolic CHF (congestive heart failure) ([place])     a. 12/2013 Echo: EF 45%;  b. 03/2015 Echo: EF 35%, mild MR, mildly dil LA.  Marland Kitchen Hyperlipidemia   . Hypertension   . Syncope and collapse     a. 12/2013 s/p MDT Linq ILR.  Marland Kitchen HIV (human immunodeficiency virus infection) ([place]) a. Dx in 1980's.  . Coronary artery disease     a. s/p prior CABG;  b. 03/2015 NSTEMI/PCI: 100ost CTO, LAD 100ost/p, 45d, D1 100/95ost, RI 75, LCX 100p, OM1/3 small, RCA 100ost->distal, RPDA/RPAV small,RPA1 80, RPA2 90, RPLB 80, RPLB2 80,  VG->D1->RI 10p, patent stents, VG->RPDA->RPLB3 99 before RPDA (3.5x24 Promus DES), 100 between PDA& RPLB3 (CTO), LIMA->LAD nl.  . Kidney stones   . 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 NSTEMI (non-ST elevated myocardial infarction) ([place]) 03/2015  . MI (myocardial infarction) ([place])     "I've had 4" (06/27/2015)  . Anginal pain ([place])   . Hypothyroidism   . Type II diabetes mellitus ([place])   . Diabetic neuropathy ([place]) 12/22/2014  . GERD (gastroesophageal reflux disease)   . History of hiatal hernia   . History of duodenal ulcer   . Stroke [facility])     a. 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  Marland Kitchen Anxiety   . Pneumonia "several times"  . Aspiration pneumonia ([place]) 06/27/2015   Past Surgical History:  Past Surgical History  Procedure Laterality Date  . Loop recorder implant      Med[employer]onic Reveal Model # [id number] MR safe  . Cystoscopy w/ stone manipulation  1980s  . Coronary artery bypass graft  1980s    "CABG X5; Dr. Lia Foyer"  . Cardiac catheterization N/A 04/19/2015    Procedure: Left Heart Cath and Coronary Angiography;   Surgeon: Leonie Man, MD;  Location: [facility];  Service: Cardiovascular;  Laterality: N/A;  . Cardiac catheterization N/A 04/19/2015    Procedure: Coronary Stent Intervention;  Surgeon: Leonie Man, MD;  Location: [facility];  Service: Cardiovascular;  Laterality: N/A;  . Coronary angioplasty with stent placement  "3-4 times"    "I've got several stents"   HPI:  Jackson Shaw is a 60 y.o. male with a Past Medical History of HTN, CHF, HLD, HTN, CVA, syncopal episodes, HIV who presents with suspected aspiration pneumonia. He reports onset of swallowing difficulties with delayed onset after two brainstem strokes.    Assessment / Plan / Recommendation Clinical Impression  Pt reports onset of swallowing difficulty primarily with thin liquids after two prior brainstem strokes, for which he seems to use compensatory strategies with Mod I. No overt s/s of aspiration observed during PO trials, but suspect impaired coordination is the root of his swallowing difficulty given description of dysphagia and strategies effective at alleviating this trouble. Audible swallows are noted throughout intake. Recommend advancement to regular textures and thin liquids as pt uses safe swallowing strategies with no overt signs of aspiration; however, given current concern for PNA and location of prior CVAs would proceed with MBS to assess for possible silent aspiration.    Aspiration Risk  Mild aspiration risk    Diet  Recommendation Regular;Thin liquid   Liquid Administration via: Straw Medication Administration: Whole meds with liquid Supervision: Patient able to self feed;Intermittent supervision to cue for compensatory strategies Compensations: Slow rate;Small sips/bites Postural Changes: Seated upright at 90 degrees    Other  Recommendations Oral Care Recommendations: Oral care BID   Follow up Recommendations   (tba)    Frequency and Duration            Prognosis         Swallow Study   General HPI: Jackson Shaw is a 60 y.o. male with a Past Medical History of HTN, CHF, HLD, HTN, CVA, syncopal episodes, HIV who presents with suspected aspiration pneumonia. He reports onset of swallowing difficulties with delayed onset after two brainstem strokes.  Type of Study: Bedside Swallow Evaluation Previous Swallow Assessment: none in chart - believes he had his swallowing checked at [facility] without difficulties Diet Prior to this Study: Dysphagia 3 (soft);Nectar-thick liquids Temperature Spikes Noted: No Respiratory Status: Room air History of Recent Intubation: No Behavior/Cognition: Alert;Cooperative;Pleasant mood Oral Cavity Assessment: Within Functional Limits Oral Care Completed by SLP: No Oral Cavity - Dentition: Adequate natural dentition Vision: Functional for self-feeding Self-Feeding Abilities: Able to feed self Patient Positioning: Upright in bed Baseline Vocal Quality: Normal Volitional Swallow: Able to elicit    Oral/Motor/Sensory Function Overall Oral Motor/Sensory Function: Within functional limits   Ice Chips Ice chips: Not tested   Thin Liquid Thin Liquid: Impaired Presentation: Cup;Self Fed;Straw Pharyngeal  Phase Impairments: Other (comments);Suspected delayed Swallow (audible swallows)    Nectar Thick Nectar Thick Liquid: Not tested   Honey Thick Honey Thick Liquid: Not tested   Puree Puree: Within functional limits Presentation: Self Fed;Spoon   Solid   GO   Solid: Within functional limits Presentation: Self Fed       Germain Osgood, M.A. CCC-SLP [phone]  Germain Osgood 06/28/2015,3:10 PM

## 2015-06-28 NOTE — Progress Notes (Signed)
Inpatient Diabetes Program Recommendations  AACE/ADA: New Consensus Statement on Inpatient Glycemic Control (2015)  Target Ranges:  Prepandial:   less than 140 mg/dL      Peak postprandial:   less than 180 mg/dL (1-2 hours)      Critically ill patients:  140 - 180 mg/dL   Results for BEXLEY, MOSEY (MRN [id number]) as of 06/28/2015 13:10  Ref. Range 06/27/2015 16:34 06/27/2015 21:21 06/28/2015 06:26 06/28/2015 12:01  Glucose-Capillary Latest Ref Range: 65-99 mg/dL 220 (H) 284 (H) 107 (H) 238 (H)   Review of Glycemic Control  Diabetes history: DM 2 Outpatient Diabetes medications: Novolog 6 units TID, Metformin 500 mg BID Current orders for Inpatient glycemic control: Novolog Sensitive + HS  Inpatient Diabetes Program Recommendations: Insulin - Meal Coverage: Glucose increased from 107 mg/dl this am to 238 mg/dl at lunch. Patient takes Novolog 6 units TID meal coverage at home. Please consider starting Novolog 4 units TID meal coverage if patient consumes 50% or more of meals.  Thanks,  Tama Headings RN, MSN, [facility] Inpatient Diabetes Coordinator Team Pager [phone] (8a-5p)

## 2015-06-29 ENCOUNTER — Inpatient Hospital Stay (HOSPITAL_COMMUNITY): Payer: Medicaid Other

## 2015-06-29 LAB — HIV ANTIBODY (ROUTINE TESTING W REFLEX)

## 2015-06-29 LAB — BASIC METABOLIC PANEL
Anion gap: 7 (ref 5–15)
BUN: 20 mg/dL (ref 6–20)
CHLORIDE: 99 mmol/L — AB (ref 101–111)
CO2: 25 mmol/L (ref 22–32)
CREATININE: 1.3 mg/dL — AB (ref 0.61–1.24)
Calcium: 8.3 mg/dL — ABNORMAL LOW (ref 8.9–10.3)
GFR calc non Af Amer: 59 mL/min — ABNORMAL LOW (ref >60)
Glucose, Bld: 177 mg/dL — ABNORMAL HIGH (ref 65–99)
POTASSIUM: 3.5 mmol/L (ref 3.5–5.1)
Sodium: 131 mmol/L — ABNORMAL LOW (ref 135–145)

## 2015-06-29 LAB — GLUCOSE, CAPILLARY
GLUCOSE-CAPILLARY: 174 mg/dL — AB (ref 65–99)
Glucose-Capillary: 152 mg/dL — ABNORMAL HIGH (ref 65–99)

## 2015-06-29 LAB — LEGIONELLA ANTIGEN, URINE

## 2015-06-29 LAB — HIV 1/2 AB DIFFERENTIATION
HIV 1 AB: POSITIVE — AB
HIV 2 AB: NEGATIVE

## 2015-06-29 MED ORDER — CLINDAMYCIN HCL 300 MG PO CAPS: 600.0000 mg | ORAL_CAPSULE | Freq: Three times a day (TID) | ORAL | Status: DC

## 2015-06-29 MED ORDER — ENSURE ENLIVE PO LIQD: 237.0000 mL | Freq: Two times a day (BID) | ORAL | Status: DC

## 2015-06-29 NOTE — Discharge Summary (Signed)
Physician Discharge Summary  Romil Gilly [id number] DOB: May 12, 1956 DOA: 06/27/2015  PCP: Pcp Not In System  Admit date: 06/27/2015 Discharge date: 06/29/2015  Time spent: 35 minutes  Recommendations for Outpatient Follow-up:  Needs follow up for resolution of PNA Needs better control of diabetes  Discharge Diagnoses:    Aspiration pneumonia ([place])   Human immunodeficiency virus (HIV) disease ([place])   Hypothyroidism   Insulin dependent type 2 diabetes mellitus, uncontrolled ([place])   S/P CABG x 5   Diabetic neuropathy ([place])   History of CVA (cerebrovascular accident)   Coronary artery disease involving coronary bypass graft of native heart with unstable angina pectoris ([place])   Bilateral pleural effusion   Acute respiratory distress ([place])   Ischemic cardiomyopathy/EF 25%   Aspiration pneumonia of right middle lobe [facility])   Discharge Condition: stable  Diet recommendation: carb modified, aspiration precaution   Filed Weights   06/27/15 1052  Weight: 72.576 kg (160 lb)    History of present illness:  This is a 60 year old male patient with known history of HIV disease on ART and is followed by the [facility] ID clinics. He was most recently hospitalized in November 2016 for non-STEMI and underwent cardiac catheterization placement of drug-eluting stent to vein graft of previous CABG. He was continued on aspirin and Plavix (lifetime), beta blocker, nitrites and statin therapy. He is not on ACE/ARB or aldosterone blocker due to prior intolerance and history of syncope in setting of aggressive CHF medications. He also has a history of diabetes on meal coverage only as well as chronic kidney disease and prior CVA with residual right-sided weakness and minimal dysarthria. He presents to the hospital with reports of shortness of breath with a sensation of breath feeling "labored" and having difficulty taking in a good deep breath. It was a minor pleuritic component. Symptoms began on 1/30. For the  past 3 weeks patient has felt progressive weakness and has had issues with difficulty with choking while swallowing liquids and foods. He is not had any fevers. He does report that over the past few weeks he's had productive yellow sputum worse over the past 2 days. He reports he did receive a flu shot. He has not had any weight gain or lower extremity edema, orthopnea or dyspnea on exertion. He reports for the past 2 days his dysarthria slightly worsened and he seems to be slightly more worse than baseline regarding his right-sided weakness  Hospital Course:  Acute respiratory distress/Aspiration pneumonia  - Zithromax and clindamycin- discharge on 5 more days of clindamycin  - LDL normal.  -speech evaluation recommended regular diet, aspiration precaution  -CD 4 Absolute at 410.    Human immunodeficiency virus (HIV) disease  -Followed by ID clinic at [facility] -In October for viral load was 6000 and patient admitted to missing some doses of his medications during that time. Noting his mother and stepfather were ill -Ck CD4 absolute at 69 -Continue preadmission ARTs   CKD 3 -cr at baseline    History of CVA  -Persistent right-sided weakness requiring cane -denies worsening of symptoms, right side weakness.  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 pectoris  -Currently asymptomatic -Continue preadmission Plavix, aspirin, statin and beta blocker and Imdur   Bilateral pleural effusion/ Ischemic cardiomyopathy-EF 25% -Very tiny bilateral effusions not associated with weight gain, lower extremity edema, dyspnea on exertion or orthopnea -BNP elevated but patient does not have typical symptoms of  heart failure -Not on diuretics or ACE/ARB secondary to previous syncope from these medications -repeat BNP in am   Hypothyroidism -Continue Synthroid   Insulin dependent type 2 diabetes mellitus,  uncontrolled  -States is no longer on metformin -Also reports episodic hypoglycemia longer acting insulins -will add meal coverage. CBG increasing.    Diabetic neuropathy  -Continue Neurontin  Procedures:  Swallow evaluation.   Consultations:  none  Discharge Exam: Filed Vitals:   06/29/15 0541 06/29/15 0835  BP: 137/89 130/77  Pulse: 69 75  Temp: 97.8 F (36.6 C)   Resp: 118     General: NAD Cardiovascular: S 1, S 2 RRR Respiratory: CTA  Discharge Instructions   Discharge Instructions    Increase activity slowly    Complete by:  As directed           Current Discharge Medication List    START taking these medications   Details  clindamycin (CLEOCIN) 300 MG capsule Take 2 capsules (600 mg total) by mouth 3 (three) times daily. Qty: 30 capsule, Refills: 0    feeding supplement, ENSURE ENLIVE, (ENSURE ENLIVE) LIQD Take 237 mLs by mouth 2 (two) times daily between meals. Qty: 237 mL, Refills: 12      CONTINUE these medications which have NOT CHANGED   Details  aspirin EC 81 MG tablet Take 1 tablet by mouth daily.    atorvastatin (LIPITOR) 20 MG tablet Take 1 tablet by mouth daily.    citalopram (CELEXA) 10 MG tablet Take 1 tablet by mouth daily.    clopidogrel (PLAVIX) 75 MG tablet Take 1 tablet by mouth daily.    Darunavir Ethanolate (PREZISTA) 800 MG tablet Take 1 tablet by mouth daily.    dolutegravir (TIVICAY) 50 MG tablet Take 1 tablet by mouth daily.    emtricitabine-tenofovir (TRUVADA) 200-300 MG per tablet Take 1 tablet by mouth daily.    gabapentin (NEURONTIN) 300 MG capsule One capsule at night for 2 weeks, then take one capsule twice a day Qty: 60 capsule, Refills: 3    insulin aspart (NOVOLOG) 100 UNIT/ML injection Inject 6 Units into the skin 3 (three) times daily with meals. 6 units is the base. Add 2 units for each 15gm of carbs    isosorbide mononitrate (IMDUR) 30 MG 24 hr tablet Take 0.5 tablets (15 mg total) by mouth daily. Qty:  15 tablet, Refills: 11    levothyroxine (SYNTHROID, LEVOTHROID) 75 MCG tablet Take 225 mcg by mouth daily.    metoprolol succinate (TOPROL XL) 25 MG 24 hr tablet Take 1 tablet (25 mg total) by mouth daily. Qty: 30 tablet, Refills: 6    pantoprazole (PROTONIX) 40 MG tablet Take 1 tablet by mouth daily.    ritonavir (NORVIR) 100 MG capsule Take 1 capsule by mouth daily.    nitroGLYCERIN (NITROSTAT) 0.4 MG SL tablet Place 1 tablet (0.4 mg total) under the tongue every 5 (five) minutes as needed for chest pain. Qty: 25 tablet, Refills: 3      STOP taking these medications     metFORMIN (GLUCOPHAGE-XR) 500 MG 24 hr tablet        Allergies  Allergen Reactions  . Penicillins Anaphylaxis  . Codeine Nausea Only   Follow-up Information    Follow up with [place] On 07/03/2015.   Why:  Transitional Care Clinic appointment on 07/03/15 at 2:00 pm with Dr. Jarold Song.   Contact information:   [address] [place] [phone]  The results of significant diagnostics from this hospitalization (including imaging, microbiology, ancillary and laboratory) are listed below for reference.    Significant Diagnostic Studies: Dg Chest 2 View  06/27/2015  CLINICAL DATA:  Shortness of breath. EXAM: CHEST  2 VIEW COMPARISON:  April 19, 2015. FINDINGS: Stable cardiomediastinal silhouette. Status post coronary artery bypass graft. No pneumothorax is noted. Minimal bilateral pleural effusions are noted. Left lung is clear. Minimal airspace opacity is noted in right middle lobe which may represent mild inflammation or atelectasis. Bony thorax is unremarkable. IMPRESSION: Minimal bilateral pleural effusions. Minimal right middle lobe airspace opacity is noted which may represent mild inflammation or atelectasis. Electronically Signed   By: Marijo Conception, M.D.   On: 06/27/2015 13:09    Microbiology: Recent Results (from the past 240  hour(s))  Culture, blood (Routine X 2) w Reflex to ID Panel     Status: None (Preliminary result)   Collection Time: 06/27/15  4:00 PM  Result Value Ref Range Status   Specimen Description BLOOD RIGHT ANTECUBITAL  Final   Special Requests   Final    BOTTLES DRAWN AEROBIC AND ANAEROBIC 10CC BLUE, 5CC RED   Culture NO GROWTH < 24 HOURS  Final   Report Status PENDING  Incomplete  Culture, blood (Routine X 2) w Reflex to ID Panel     Status: None (Preliminary result)   Collection Time: 06/27/15  4:29 PM  Result Value Ref Range Status   Specimen Description BLOOD RIGHT FOREARM  Final   Special Requests BOTTLES DRAWN AEROBIC AND ANAEROBIC 5CC  Final   Culture NO GROWTH < 24 HOURS  Final   Report Status PENDING  Incomplete     Labs: Basic Metabolic Panel:  Recent Labs Lab 06/27/15 1137 06/28/15 0400 06/29/15 0348  NA 132* 132* 131*  K 4.3 3.5 3.5  CL 96* 97* 99*  CO2 25 26 25   GLUCOSE 297* 142* 177*  BUN 18 17 20   CREATININE 1.36* 1.21 1.30*  CALCIUM 9.1 8.7* 8.3*   Liver Function Tests:  Recent Labs Lab 06/27/15 1137  AST 30  ALT 27  ALKPHOS 132*  BILITOT 1.6*  PROT 7.6  ALBUMIN 3.5   No results for input(s): LIPASE, AMYLASE in the last 168 hours. No results for input(s): AMMONIA in the last 168 hours. CBC:  Recent Labs Lab 06/27/15 1137 06/27/15 1910  WBC 8.0 7.0  NEUTROABS 5.8 4.0  HGB 12.9* 12.4*  HCT 38.3* 35.9*  MCV 86.1 86.1  PLT 169 168   Cardiac Enzymes: No results for input(s): CKTOTAL, CKMB, CKMBINDEX, TROPONINI in the last 168 hours. BNP: BNP (last 3 results)  Recent Labs  06/27/15 1130 06/28/15 0400  BNP 1767.6* 1071.7*    ProBNP (last 3 results) No results for input(s): PROBNP in the last 8760 hours.  CBG:  Recent Labs Lab 06/28/15 1201 06/28/15 1624 06/28/15 2122 06/28/15 2157 06/29/15 0538  GLUCAP 238* 226* 130* 156* 152*       Signed:  Niel Hummer A MD.  [facility] 06/29/2015, 11:13 AM

## 2015-06-29 NOTE — Progress Notes (Signed)
    MBSS complete. Full report located under chart review in imaging section.    Sakira Dahmer Willis Mikalia Fessel M.Ed CCC-SLP Pager [phone]      

## 2015-06-29 NOTE — Progress Notes (Addendum)
Initial Nutrition Assessment  DOCUMENTATION CODES:   Non-severe (moderate) malnutrition in context of chronic illness  INTERVENTION:  -Ensure Enlive po BID, each supplement provides 350 kcal and 20 grams of protein  NUTRITION DIAGNOSIS:   Malnutrition related to chronic illness as evidenced by moderate depletion of body fat, moderate depletions of muscle mass.  GOAL:   Patient will meet greater than or equal to 90% of their needs  MONITOR:   Supplement acceptance, I & O's, Labs, Skin  REASON FOR ASSESSMENT:   Malnutrition Screening Tool    ASSESSMENT:   This is a 60 year old male patient with known history of HIV disease on ART and is followed by the [facility] ID clinics. He was most recently hospitalized in November 2016 for non-STEMI and underwent cardiac catheterization placement of drug-eluting stent to vein graft of previous CABG. He was continued on aspirin and Plavix (lifetime), beta blocker, nitrites and statin therapy  Spoke with pt at bedside. He reports that eats a large breakfast, generally involving Eggs, Grits, Toast, and some fruit. He stated that he normally eats smaller meals for lunch and dinner. He stated he does this to control his weight and his blood sugar. He admits to some weight loss recently. His father in law passed away 5 weeks ago from cancer and his mother had surgery 2 weeks ago related to cancer. Patient stated he had decreased PO intake around his father-in-law's passing. Per chart he has only last 3# between admits, and it appears to be within his normal weight fluctuations.  Pt has a history of progressive weakness, prior CVA w/ right sided weakness and dysarthria. For the past 3 weeks, he has become even weaker, and is having difficulty choking while swallowing liquids and foods. Was on NDD3 Nectar Thick, but SLP has since cleared him for regular textures, citing him as a mild aspiration risk.  Labs and Medications reviewed.  Nutrition-Focused  physical exam completed. Findings are moderate fat depletion, moderate muscle depletion, and no edema.   Diet Order:  Diet Carb Modified Fluid consistency:: Thin; Room service appropriate?: Yes  Skin:  Reviewed, no issues  Last BM:  1/31  Height:   Ht Readings from Last 1 Encounters:  06/27/15 5\' 8"  (1.727 m)    Weight:   Wt Readings from Last 1 Encounters:  06/27/15 160 lb (72.576 kg)    Ideal Body Weight:  70 kg  BMI:  Body mass index is 24.33 kg/(m^2).  Estimated Nutritional Needs:   Kcal:  1800-2200 calories  Protein:  72-87 grams  Fluid:  >/= 1.8L  EDUCATION NEEDS:   No education needs identified at this time  Satira Anis. Marwah Disbro, MS, RD LDN After Hours/Weekend Pager [phone]

## 2015-06-29 NOTE — Progress Notes (Signed)
Patients iv removed no issues at present. Reviewed discharge instructions. Awaiting discharge via wheelchair.   Axle Parfait, Mervin Kung RN

## 2015-06-30 ENCOUNTER — Telehealth: Payer: Self-pay

## 2015-06-30 LAB — RESPIRATORY VIRUS PANEL
Adenovirus: NEGATIVE
INFLUENZA B 1: NEGATIVE
Influenza A: NEGATIVE
METAPNEUMOVIRUS: NEGATIVE
PARAINFLUENZA 2 A: NEGATIVE
PARAINFLUENZA 3 A: NEGATIVE
Parainfluenza 1: NEGATIVE
RESPIRATORY SYNCYTIAL VIRUS A: NEGATIVE
Respiratory Syncytial Virus B: NEGATIVE
Rhinovirus: NEGATIVE

## 2015-06-30 NOTE — Telephone Encounter (Signed)
Transitional Care Clinic Post-discharge Follow-Up Phone Call:  Date of Discharge: 06/29/15 Principal Discharge Diagnosis(es): Aspiration pneumonia  Call Completed: Yes                   With Whom: Patient   Please check all that apply:  X  Patient is knowledgeable of his/her condition(s) and/or treatment. X  Patient is caring for self at home.  ? Patient is receiving assist at home from family and/or caregiver. Family and/or caregiver is knowledgeable of patient's condition(s) and/or treatment. ? Patient is receiving home health services. If so, name of agency.     Medication Reconciliation:  X  Patient obtained all discharge medications-YES. Patient's discharge medications reviewed. Patient indicated he picked up his discharge medications and is taking them as prescribed. Informed patient that per discharge summary he is to STOP taking Metformin 500 mg daily. Patient verbalized understanding.    Activities of Daily Living:  X  Independent-Patient does use a four point cane for ambulation. He also indicated he has a glucometer and diabetes monitoring supplies.  ? Needs assist  ? Total [facility]    Community resources in place for patient:  X  None  ? Home Health/Home DME ? Assisted Living ? Support Group                Questions/Concerns discussed: This Case Manager placed call to patient`to check on status and to remind him of upcoming Transitional Care Clinic appointment on 07/03/15 at 1400 with Dr. Jarold Song. Patient indicated he was doing well and had no concerns. Remains afebrile. He indicated he picked up his discharge medications and indicated he was taking them as prescribed. Patient aware of upcoming Transitional Care Clinic appointment and indicated he had transportation to his appointment.

## 2015-07-02 LAB — CULTURE, BLOOD (ROUTINE X 2)
Culture: NO GROWTH
Culture: NO GROWTH

## 2015-07-03 ENCOUNTER — Ambulatory Visit: Payer: Medicaid Other | Attending: Family Medicine | Admitting: Family Medicine

## 2015-07-03 ENCOUNTER — Encounter: Payer: Self-pay | Admitting: Family Medicine

## 2015-07-03 VITALS — BP 148/86 | HR 66 | Temp 98.3°F | Resp 14 | Ht 69.0 in | Wt 164.4 lb

## 2015-07-03 DIAGNOSIS — Z79899 Other long term (current) drug therapy: Secondary | ICD-10-CM | POA: Insufficient documentation

## 2015-07-03 DIAGNOSIS — Z7982 Long term (current) use of aspirin: Secondary | ICD-10-CM | POA: Diagnosis not present

## 2015-07-03 DIAGNOSIS — E038 Other specified hypothyroidism: Secondary | ICD-10-CM

## 2015-07-03 DIAGNOSIS — I255 Ischemic cardiomyopathy: Secondary | ICD-10-CM | POA: Diagnosis not present

## 2015-07-03 DIAGNOSIS — J690 Pneumonitis due to inhalation of food and vomit: Secondary | ICD-10-CM

## 2015-07-03 DIAGNOSIS — E119 Type 2 diabetes mellitus without complications: Secondary | ICD-10-CM | POA: Insufficient documentation

## 2015-07-03 DIAGNOSIS — B20 Human immunodeficiency virus [HIV] disease: Secondary | ICD-10-CM | POA: Diagnosis not present

## 2015-07-03 DIAGNOSIS — E039 Hypothyroidism, unspecified: Secondary | ICD-10-CM | POA: Diagnosis not present

## 2015-07-03 DIAGNOSIS — Z955 Presence of coronary angioplasty implant and graft: Secondary | ICD-10-CM | POA: Insufficient documentation

## 2015-07-03 DIAGNOSIS — E7800 Pure hypercholesterolemia, unspecified: Secondary | ICD-10-CM | POA: Diagnosis not present

## 2015-07-03 DIAGNOSIS — Z951 Presence of aortocoronary bypass graft: Secondary | ICD-10-CM | POA: Diagnosis not present

## 2015-07-03 DIAGNOSIS — I10 Essential (primary) hypertension: Secondary | ICD-10-CM | POA: Diagnosis not present

## 2015-07-03 DIAGNOSIS — Z794 Long term (current) use of insulin: Secondary | ICD-10-CM

## 2015-07-03 DIAGNOSIS — I2510 Atherosclerotic heart disease of native coronary artery without angina pectoris: Secondary | ICD-10-CM | POA: Diagnosis not present

## 2015-07-03 DIAGNOSIS — Z8673 Personal history of transient ischemic attack (TIA), and cerebral infarction without residual deficits: Secondary | ICD-10-CM | POA: Diagnosis not present

## 2015-07-03 DIAGNOSIS — E118 Type 2 diabetes mellitus with unspecified complications: Secondary | ICD-10-CM

## 2015-07-03 DIAGNOSIS — R471 Dysarthria and anarthria: Secondary | ICD-10-CM | POA: Insufficient documentation

## 2015-07-03 LAB — TSH: TSH: 6.62 mIU/L — ABNORMAL HIGH (ref 0.40–4.50)

## 2015-07-03 LAB — GLUCOSE, POCT (MANUAL RESULT ENTRY): POC Glucose: 324 mg/dl — AB (ref 70–99)

## 2015-07-03 MED ORDER — INSULIN GLARGINE 100 UNIT/ML SOLOSTAR PEN: 10.0000 [IU] | PEN_INJECTOR | Freq: Every day | SUBCUTANEOUS | Status: DC

## 2015-07-03 MED ORDER — INSULIN PEN NEEDLE 31G X 5 MM MISC: 1.0000 | Freq: Every day | Status: DC

## 2015-07-03 NOTE — Patient Instructions (Signed)
Diabetes Mellitus and Food It is important for you to manage your blood sugar (glucose) level. Your blood glucose level can be greatly affected by what you eat. Eating healthier foods in the appropriate amounts throughout the day at about the same time each day will help you control your blood glucose level. It can also help slow or prevent worsening of your diabetes mellitus. Healthy eating may even help you improve the level of your blood pressure and reach or maintain a healthy weight.  General recommendations for healthful eating and cooking habits include:  Eating meals and snacks regularly. Avoid going long periods of time without eating to lose weight.  Eating a diet that consists mainly of plant-based foods, such as fruits, vegetables, nuts, legumes, and whole grains.  Using low-heat cooking methods, such as baking, instead of high-heat cooking methods, such as deep frying. Work with your dietitian to make sure you understand how to use the Nutrition Facts information on food labels. HOW CAN FOOD AFFECT ME? Carbohydrates Carbohydrates affect your blood glucose level more than any other type of food. Your dietitian will help you determine how many carbohydrates to eat at each meal and teach you how to count carbohydrates. Counting carbohydrates is important to keep your blood glucose at a healthy level, especially if you are using insulin or taking certain medicines for diabetes mellitus. Alcohol Alcohol can cause sudden decreases in blood glucose (hypoglycemia), especially if you use insulin or take certain medicines for diabetes mellitus. Hypoglycemia can be a life-threatening condition. Symptoms of hypoglycemia (sleepiness, dizziness, and disorientation) are similar to symptoms of having too much alcohol.  If your health care provider has given you approval to drink alcohol, do so in moderation and use the following guidelines:  Women should not have more than one drink per day, and men  should not have more than two drinks per day. One drink is equal to:  12 oz of beer.  5 oz of wine.  1 oz of hard liquor.  Do not drink on an empty stomach.  Keep yourself hydrated. Have water, diet soda, or unsweetened iced tea.  Regular soda, juice, and other mixers might contain a lot of carbohydrates and should be counted. WHAT FOODS ARE NOT RECOMMENDED? As you make food choices, it is important to remember that all foods are not the same. Some foods have fewer nutrients per serving than other foods, even though they might have the same number of calories or carbohydrates. It is difficult to get your body what it needs when you eat foods with fewer nutrients. Examples of foods that you should avoid that are high in calories and carbohydrates but low in nutrients include:  Trans fats (most processed foods list trans fats on the Nutrition Facts label).  Regular soda.  Juice.  Candy.  Sweets, such as cake, pie, doughnuts, and cookies.  Fried foods. WHAT FOODS CAN I EAT? Eat nutrient-rich foods, which will nourish your body and keep you healthy. The food you should eat also will depend on several factors, including:  The calories you need.  The medicines you take.  Your weight.  Your blood glucose level.  Your blood pressure level.  Your cholesterol level. You should eat a variety of foods, including:  Protein.  Lean cuts of meat.  Proteins low in saturated fats, such as fish, egg whites, and beans. Avoid processed meats.  Fruits and vegetables.  Fruits and vegetables that may help control blood glucose levels, such as apples, mangoes, and   yams.  Dairy products.  Choose fat-free or low-fat dairy products, such as milk, yogurt, and cheese.  Grains, bread, pasta, and rice.  Choose whole grain products, such as multigrain bread, whole oats, and brown rice. These foods may help control blood pressure.  Fats.  Foods containing healthful fats, such as nuts,  avocado, olive oil, canola oil, and fish. DOES EVERYONE WITH DIABETES MELLITUS HAVE THE SAME MEAL PLAN? Because every person with diabetes mellitus is different, there is not one meal plan that works for everyone. It is very important that you meet with a dietitian who will help you create a meal plan that is just right for you.   This information is not intended to replace advice given to you by your health care provider. Make sure you discuss any questions you have with your health care provider.   Document Released: 02/07/2005 Document Revised: 06/03/2014 Document Reviewed: 04/09/2013 Elsevier Interactive Patient Education 2016 [employer]  

## 2015-07-03 NOTE — Progress Notes (Signed)
Patient here to establish after recent bought with PNA He reports no pain and sees ID clinic at [facility]

## 2015-07-04 ENCOUNTER — Other Ambulatory Visit: Payer: Self-pay | Admitting: Family Medicine

## 2015-07-04 DIAGNOSIS — E038 Other specified hypothyroidism: Secondary | ICD-10-CM

## 2015-07-04 MED ORDER — LEVOTHYROXINE SODIUM 75 MCG PO TABS: 300.0000 ug | ORAL_TABLET | Freq: Every day | ORAL | Status: DC

## 2015-07-04 NOTE — Progress Notes (Addendum)
[place]  Date of telephone encounter: 06/30/15  Admit date: 06/27/15 Discharge date: 06/29/15  PCP: None  Subjective:  Patient ID: Jackson Shaw, male    DOB: [birth year]  Age: 60 y.o. MRN: [id number]  CC: Hospitalization Follow-up   HPI Yazan Bethancourt is a 60 year old male with a history of hypertension, type 2 diabetes mellitus (A1c 11.8), HIV (absolute CD4 count 410), coronary artery disease status post stent placement to the vein graft of previous CABG, ischemic cardiomyopathy (EF 25%), previous history of CVA (mild right-sided weakness with mild dysarthria), hypothyroidism recently hospitalized at [facility] for acute respiratory distress secondary to aspiration pneumonia.  He had presented to the ED with shortness of breath productive of yellowish sputum and also progressive weakness and choking while swallowing food. Chest x-ray revealed minimal bilateral pleural effusions, minimal right middle lobe air space opacity which may represent mild inflammation or atelectasis. He was commenced on azithromycin and clindamycin. He had a speech and swallow evaluation and recommendation was for regular diet with aspiration precaution. His condition improved and he was subsequently discharged on 5 more days of clindamycin.  Today he reports doing well and has been compliant with his medications. He does not see a cardiologist for his cardiac conditions as he missed a previously scheduled appointment. He ambulates with the aid of a cane due to mild right-sided weakness.  Outpatient Prescriptions Prior to Visit  Medication Sig Dispense Refill  . aspirin EC 81 MG tablet Take 1 tablet by mouth daily.    Marland Kitchen atorvastatin (LIPITOR) 20 MG tablet Take 1 tablet by mouth daily.    . citalopram (CELEXA) 10 MG tablet Take 1 tablet by mouth daily.    . clindamycin (CLEOCIN) 300 MG capsule Take 2 capsules (600 mg total) by mouth 3 (three) times daily. 30 capsule 0  . clopidogrel (PLAVIX) 75 MG  tablet Take 1 tablet by mouth daily.    . Darunavir Ethanolate (PREZISTA) 800 MG tablet Take 1 tablet by mouth daily.    . dolutegravir (TIVICAY) 50 MG tablet Take 1 tablet by mouth daily.    Marland Kitchen emtricitabine-tenofovir (TRUVADA) 200-300 MG per tablet Take 1 tablet by mouth daily.    . feeding supplement, ENSURE ENLIVE, (ENSURE ENLIVE) LIQD Take 237 mLs by mouth 2 (two) times daily between meals. 237 mL 12  . gabapentin (NEURONTIN) 300 MG capsule One capsule at night for 2 weeks, then take one capsule twice a day (Patient taking differently: Take 300 mg by mouth 2 (two) times daily. ) 60 capsule 3  . insulin aspart (NOVOLOG) 100 UNIT/ML injection Inject 6 Units into the skin 3 (three) times daily with meals. 6 units is the base. Add 2 units for each 15gm of carbs    . isosorbide mononitrate (IMDUR) 30 MG 24 hr tablet Take 0.5 tablets (15 mg total) by mouth daily. 15 tablet 11  . metoprolol succinate (TOPROL XL) 25 MG 24 hr tablet Take 1 tablet (25 mg total) by mouth daily. 30 tablet 6  . nitroGLYCERIN (NITROSTAT) 0.4 MG SL tablet Place 1 tablet (0.4 mg total) under the tongue every 5 (five) minutes as needed for chest pain. 25 tablet 3  . pantoprazole (PROTONIX) 40 MG tablet Take 1 tablet by mouth daily.    . ritonavir (NORVIR) 100 MG capsule Take 1 capsule by mouth daily.    Marland Kitchen levothyroxine (SYNTHROID, LEVOTHROID) 75 MCG tablet Take 225 mcg by mouth daily.     No facility-administered medications prior to visit.  ROS Review of Systems  Constitutional: Negative for activity change and appetite change.  HENT: Negative for sinus pressure and sore throat.   Eyes: Negative for visual disturbance.  Respiratory: Negative for cough, chest tightness and shortness of breath.   Cardiovascular: Negative for chest pain and leg swelling.  Gastrointestinal: Negative for abdominal pain, diarrhea, constipation and abdominal distention.  Endocrine: Negative.   Genitourinary: Negative for dysuria.    Musculoskeletal: Negative for myalgias and joint swelling.  Skin: Negative for rash.  Allergic/Immunologic: Negative.   Neurological: Positive for speech difficulty and weakness. Negative for light-headedness and numbness.  Psychiatric/Behavioral: Negative for suicidal ideas and dysphoric mood.    Objective:  BP 148/86 mmHg  Pulse 66  Temp(Src) 98.3 F (36.8 C)  Resp 14  Ht 5\' 9"  (1.753 m)  Wt 164 lb 6.4 oz (74.571 kg)  BMI 24.27 kg/m2  SpO2 98%  BP/Weight 07/03/2015 06/29/2015 [id number]  Systolic BP [id number] -  Diastolic BP 86 77 -  Wt. (Lbs) 164.4 - 160  BMI 24.27 - 24.33      Physical Exam  Constitutional: He is oriented to person, place, and time. He appears well-developed and well-nourished.  Neck: Normal range of motion. Neck supple. No tracheal deviation present.  Cardiovascular: Normal rate, regular rhythm and normal heart sounds.   No murmur heard. Pulmonary/Chest: Effort normal and breath sounds normal. No respiratory distress. He has no wheezes. He exhibits no tenderness.  Abdominal: Soft. Bowel sounds are normal. He exhibits no mass. There is no tenderness.  Musculoskeletal: Normal range of motion. He exhibits no edema or tenderness.  Neurological: He is alert and oriented to person, place, and time.  Skin: Skin is warm and dry.  Psychiatric: He has a normal mood and affect.     Assessment & Plan:   1. Type 2 diabetes mellitus with complication, with long-term current use of insulin (HCC) Uncontrolled with A1c of 11.8. Patient has only been on NovoLog insulin have added Lantus to his regimen - Glucose (CBG) - Insulin Glargine (LANTUS SOLOSTAR) 100 UNIT/ML Solostar Pen; Inject 10 Units into the skin daily at 10 pm.  Dispense: 5 pen; Refill: 2 - Insulin Pen Needle 31G X 5 MM MISC; 1 each by Does not apply route at bedtime.  Dispense: 30 each; Refill: 5  2. S/P CABG x 5 Stable - Ambulatory referral to Cardiology  3. Human immunodeficiency virus (HIV)  disease ([place]) Currently on antiretrovirals therapy. Managed by infectious disease at [facility]  4. HYPERCHOLESTEROLEMIA Continue Lipitor  5. Ischemic cardiomyopathy/EF 25% No evidence of fluid overload Not on an ACE inhibitor due to soft blood pressure - Ambulatory referral to Cardiology  6. Aspiration pneumonia of right middle lobe, unspecified aspiration pneumonia type ([place]) Resolved  7. Other specified hypothyroidism - TSH   Meds ordered this encounter  Medications  . Insulin Glargine (LANTUS SOLOSTAR) 100 UNIT/ML Solostar Pen    Sig: Inject 10 Units into the skin daily at 10 pm.    Dispense:  5 pen    Refill:  2  . Insulin Pen Needle 31G X 5 MM MISC    Sig: 1 each by Does not apply route at bedtime.    Dispense:  30 each    Refill:  5    Follow-up: No Follow-up on file.   Arnoldo Morale MD

## 2015-07-05 ENCOUNTER — Telehealth: Payer: Self-pay

## 2015-07-05 ENCOUNTER — Telehealth: Payer: Self-pay | Admitting: *Deleted

## 2015-07-05 NOTE — Telephone Encounter (Signed)
Spoke with patient,verified name and date of birth and gave instructions to patient.  Patient verified understanding.

## 2015-07-05 NOTE — Telephone Encounter (Signed)
-----   Message from Arnoldo Morale, MD sent at 07/04/2015  8:44 AM EST ----- TSH is abnormal and so I have increased the dose of his levothyroxine from 3 tablets(215mcg) to 4 tablets(322mcg) daily

## 2015-07-05 NOTE — Telephone Encounter (Signed)
Request from Dr Jarold Song to schedule the appointment for a 2 week follow up.  As per EPIC, the patient already has a 2 week follow appointment scheduled - 07/17/15 @ 1430.

## 2015-07-06 ENCOUNTER — Ambulatory Visit (INDEPENDENT_AMBULATORY_CARE_PROVIDER_SITE_OTHER): Payer: Medicaid Other | Admitting: Cardiology

## 2015-07-06 ENCOUNTER — Encounter: Payer: Self-pay | Admitting: Cardiology

## 2015-07-06 VITALS — BP 142/90 | HR 64 | Ht 68.0 in | Wt 161.0 lb

## 2015-07-06 DIAGNOSIS — R079 Chest pain, unspecified: Secondary | ICD-10-CM | POA: Diagnosis not present

## 2015-07-06 DIAGNOSIS — Z951 Presence of aortocoronary bypass graft: Secondary | ICD-10-CM | POA: Diagnosis not present

## 2015-07-06 MED ORDER — ISOSORBIDE MONONITRATE ER 30 MG PO TB24: 30.0000 mg | ORAL_TABLET | Freq: Every day | ORAL | Status: AC

## 2015-07-06 MED ORDER — CLOPIDOGREL BISULFATE 75 MG PO TABS
75.0000 mg | ORAL_TABLET | Freq: Every day | ORAL | Status: AC
Start: 2015-07-06 — End: ?

## 2015-07-06 MED ORDER — ATORVASTATIN CALCIUM 20 MG PO TABS: 20.0000 mg | ORAL_TABLET | Freq: Every day | ORAL | Status: AC

## 2015-07-06 MED ORDER — METOPROLOL SUCCINATE ER 25 MG PO TB24: 25.0000 mg | ORAL_TABLET | Freq: Every day | ORAL | Status: AC

## 2015-07-06 NOTE — Progress Notes (Signed)
07/06/2015 [name]   07/26/1955  [id number]  Primary Physician Arnoldo Morale, MD Primary Cardiologist: Dr. 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 This has resulted in persistent right-sided weakness, requiring use of a cane.  He recently presented to [facility] 04/19/15 with CP c/w unstable angina. He ruled for NSTEMI, eventually peaking his troponin at 38.53. He underwent diagnostic catheterization on 11/23 revealing severe native CAD with patent LIMA  LAD and sequential vein graft D1 Ramus. The sequential vein graft  RPDA  RPLB3 was notable for a 99% stenosis within the graft proximal to the RPDA insertion. Beyond the RPDA, the graft was chronically occluded. The proximal stenosis was felt to be the culprit for current symptoms and was successfully treated using a 3.5 x 24 mm Promus Premier DES. Mr. Rodenberger tolerated the procedure well and post-procedure has had no further chest pain. 2D echocardiography was performed on 11/23 revealing an EF of 25%, which is down from 45% in 2015. He has been maintained on ASA, plavix, bb, nitrate, and statin therapy. He is not on an ACEI/ARB/ARNI/aldosterone blocker secondary to prior intolerance and h/o syncope in the setting of CHF medications. He was discharged on 04/20/15 in stable condition. It appears that he was never seen for post hospital f/u after that hospital stay.  He was readmitted 06/29/15 by IM at [facility] after presenting with a compliant of dyspnea. He had also noted a recently history of choking while swallowing liquids and foods. He was admitted for acute respiratory distress in the setting of aspiration PNA. He was treated with antibiotics.  He presents to clinic today for f/u. He is  accompanied by his partner. He reports that he is recovering from his PNA. He denies any recurrent dyspnea. He has 1 day left of his antibiotic course.  In regards to his CAD, he notes occasional chest tightness mostly if he over exerts himself. He also has occasional resting CP but it is short-lived and resolves spontaneously. He has not had to use any SL NTG. He reports full medication compliance with all of his meds including ASA and Plavix. His EKG today shows lateral TWIs not present on recent hospital EKG 06/27/15. He is currently CP free. BP is 142/90.     Current Outpatient Prescriptions  Medication Sig Dispense Refill  . aspirin EC 81 MG tablet Take 1 tablet by mouth daily.    Marland Kitchen atorvastatin (LIPITOR) 20 MG tablet Take 1 tablet by mouth daily.    . citalopram (CELEXA) 10 MG tablet Take 1 tablet by mouth daily.    . clindamycin (CLEOCIN) 300 MG capsule Take 2 capsules (600 mg total) by mouth 3 (three) times daily. 30 capsule 0  . clopidogrel (PLAVIX) 75 MG tablet Take 1 tablet by mouth daily.    . Darunavir Ethanolate (PREZISTA) 800 MG tablet Take 1 tablet by mouth daily.    . dolutegravir (TIVICAY) 50 MG tablet Take 1 tablet by mouth daily.    Marland Kitchen emtricitabine-tenofovir (TRUVADA) 200-300 MG per tablet Take 1 tablet by mouth daily.    . feeding supplement, ENSURE ENLIVE, (ENSURE ENLIVE) LIQD Take 237 mLs by mouth 2 (two) times daily between meals. 237 mL 12  . gabapentin (NEURONTIN) 300 MG capsule One  capsule at night for 2 weeks, then take one capsule twice a day (Patient taking differently: Take 300 mg by mouth 2 (two) times daily. ) 60 capsule 3  . insulin aspart (NOVOLOG) 100 UNIT/ML injection Inject 6 Units into the skin 3 (three) times daily with meals. 6 units is the base. Add 2 units for each 15gm of carbs    . Insulin Glargine (LANTUS SOLOSTAR) 100 UNIT/ML Solostar Pen Inject 10 Units into the skin daily at 10 pm. 5 pen 2  . Insulin Pen Needle 31G X 5 MM MISC 1 each by Does not  apply route at bedtime. 30 each 5  . isosorbide mononitrate (IMDUR) 30 MG 24 hr tablet Take 0.5 tablets (15 mg total) by mouth daily. 15 tablet 11  . levothyroxine (SYNTHROID, LEVOTHROID) 75 MCG tablet Take 4 tablets (300 mcg total) by mouth daily. Discontinue previous dose 120 tablet 2  . metoprolol succinate (TOPROL XL) 25 MG 24 hr tablet Take 1 tablet (25 mg total) by mouth daily. 30 tablet 6  . nitroGLYCERIN (NITROSTAT) 0.4 MG SL tablet Place 1 tablet (0.4 mg total) under the tongue every 5 (five) minutes as needed for chest pain. 25 tablet 3  . pantoprazole (PROTONIX) 40 MG tablet Take 1 tablet by mouth daily.    . ritonavir (NORVIR) 100 MG capsule Take 1 capsule by mouth daily.     No current facility-administered medications for this visit.    Allergies  Allergen Reactions  . Penicillins Anaphylaxis  . Codeine Nausea Only    Social History   Social History  . Marital Status: Significant Other    Spouse Name: N/A  . Number of Children: 0  . Years of Education: MA   Occupational History  . Retired    Social History Main Topics  . Smoking status: Never Smoker   . Smokeless tobacco: Never Used  . Alcohol Use: No  . Drug Use: No  . Sexual Activity: Yes   Other Topics Concern  . Not on file   Social History Narrative   Patient lives at home with partner.   Caffeine use: 3 cups daily     Review of Systems: General: negative for chills, fever, night sweats or weight changes.  Cardiovascular: negative for chest pain, dyspnea on exertion, edema, orthopnea, palpitations, paroxysmal nocturnal dyspnea or shortness of breath Dermatological: negative for rash Respiratory: negative for cough or wheezing Urologic: negative for hematuria Abdominal: negative for nausea, vomiting, diarrhea, bright red blood per rectum, melena, or hematemesis Neurologic: negative for visual changes, syncope, or dizziness All other systems reviewed and are otherwise negative except as noted  above.    Blood pressure 142/90, pulse 64, height 5\' 8"  (1.727 m), weight 161 lb (73.029 kg).  General appearance: alert, cooperative and no distress Neck: no carotid bruit and no JVD Lungs: clear to auscultation bilaterally Heart: regular rate and rhythm, S1, S2 normal, no murmur, click, rub or gallop Extremities: no LEE Pulses: 2+ and symmetric Skin: warm and dry Neurologic: Grossly normal  EKG new TWI in V4-V6.  ASSESSMENT AND PLAN:    1. CAD: s/p CABG x 5. Recent NSTEMI 03/2015 resulting in PCI + DES to sequential vein graft  RPDA  RPLB3. He notes occasional exertion chest discomfort with over-exertion and occasional self limiting chest tightness. EKG also shows TWIs in lateral leads. He has been fully compliant with meds. His SBP is in the 140s. We will increase Imdur to 30 mg daily. Continue ASA, Plavix, Lipitor, Metoprolol  and Imdur.  Will order lexiscan stress test to assess for any significant ischemia.   2. Ischemic Cardiomyopathy/Chronic Systolic CHF: EF of 123456, which is down from 45% in 2015. He has been maintained on ASA, plavix, bb, nitrate, and statin therapy. He is not on an ACEI/ARB/ARNI/aldosterone blocker secondary to prior intolerance and h/o syncope in the setting of CHF medications. He is euvolemic w/o dyspnea.   3. Aspiration PNA: dyspnea resolved. Finishing antibiotic course. No fever or chills. Lung exam is normal.   4. Left Midbrain Lacunar Infarct: diagnosed11/2016 and treated at [facility]. This has resulted in persistent right-sided weakness, requiring use of a cane. He is on ASA and Plavix.   PLAN  Increase Imdur to 30 mg. NST to assess for new ischemia. F/u with Dr. Debara Pickett.   Lyda Jester PA-C 07/06/2015 4:43 PM

## 2015-07-06 NOTE — Patient Instructions (Signed)
Medication Instructions:  Your physician has recommended you make the following change in your medication:  1. Increase Imdur ( 30 mg ) daily 2. All cardiac medications were filled today at your office visit   Labwork: -None  Testing/Procedures: Your physician has requested that you have a lexiscan myoview. For further information please visit [email or web address]. Please follow instruction sheet, as given.    Follow-Up: Your physician recommends that you keep your scheduled follow-up appointment with Dr. Debara Pickett at [facility] location    Any Other Special Instructions Will Be Listed Below (If Applicable).     If you need a refill on your cardiac medications before your next appointment, please call your pharmacy.

## 2015-07-14 ENCOUNTER — Telehealth: Payer: Self-pay

## 2015-07-14 NOTE — Telephone Encounter (Signed)
This Case Manager placed call to patient to check on status and to remind him of [facility] follow-up appointment on 07/17/15 at 1430 with Dr. Jarold Song. Call placed to [phone]; unable to reach patient. HIPPA compliant voicemail left requesting return call. In addition, call placed to [phone]; unable to reach patient.  HIPPA compliant voicemail left requesting return call.

## 2015-07-17 ENCOUNTER — Encounter: Payer: Self-pay | Admitting: Family Medicine

## 2015-07-17 ENCOUNTER — Ambulatory Visit: Payer: Medicaid Other | Attending: Family Medicine | Admitting: Family Medicine

## 2015-07-17 VITALS — BP 162/70 | HR 63 | Temp 97.6°F | Resp 15 | Ht 68.0 in | Wt 163.4 lb

## 2015-07-17 DIAGNOSIS — Z951 Presence of aortocoronary bypass graft: Secondary | ICD-10-CM | POA: Diagnosis not present

## 2015-07-17 DIAGNOSIS — Z79899 Other long term (current) drug therapy: Secondary | ICD-10-CM | POA: Insufficient documentation

## 2015-07-17 DIAGNOSIS — Z955 Presence of coronary angioplasty implant and graft: Secondary | ICD-10-CM | POA: Insufficient documentation

## 2015-07-17 DIAGNOSIS — R079 Chest pain, unspecified: Secondary | ICD-10-CM | POA: Diagnosis not present

## 2015-07-17 DIAGNOSIS — R471 Dysarthria and anarthria: Secondary | ICD-10-CM | POA: Insufficient documentation

## 2015-07-17 DIAGNOSIS — E118 Type 2 diabetes mellitus with unspecified complications: Secondary | ICD-10-CM

## 2015-07-17 DIAGNOSIS — B20 Human immunodeficiency virus [HIV] disease: Secondary | ICD-10-CM

## 2015-07-17 DIAGNOSIS — Z794 Long term (current) use of insulin: Secondary | ICD-10-CM

## 2015-07-17 DIAGNOSIS — I25110 Atherosclerotic heart disease of native coronary artery with unstable angina pectoris: Secondary | ICD-10-CM | POA: Insufficient documentation

## 2015-07-17 DIAGNOSIS — E039 Hypothyroidism, unspecified: Secondary | ICD-10-CM | POA: Diagnosis not present

## 2015-07-17 DIAGNOSIS — R946 Abnormal results of thyroid function studies: Secondary | ICD-10-CM | POA: Diagnosis not present

## 2015-07-17 DIAGNOSIS — I10 Essential (primary) hypertension: Secondary | ICD-10-CM | POA: Diagnosis not present

## 2015-07-17 DIAGNOSIS — Z8673 Personal history of transient ischemic attack (TIA), and cerebral infarction without residual deficits: Secondary | ICD-10-CM | POA: Diagnosis not present

## 2015-07-17 DIAGNOSIS — I679 Cerebrovascular disease, unspecified: Secondary | ICD-10-CM | POA: Insufficient documentation

## 2015-07-17 DIAGNOSIS — E119 Type 2 diabetes mellitus without complications: Secondary | ICD-10-CM | POA: Diagnosis present

## 2015-07-17 DIAGNOSIS — E785 Hyperlipidemia, unspecified: Secondary | ICD-10-CM | POA: Diagnosis not present

## 2015-07-17 DIAGNOSIS — I25700 Atherosclerosis of coronary artery bypass graft(s), unspecified, with unstable angina pectoris: Secondary | ICD-10-CM

## 2015-07-17 DIAGNOSIS — Z95818 Presence of other cardiac implants and grafts: Secondary | ICD-10-CM

## 2015-07-17 DIAGNOSIS — Z7982 Long term (current) use of aspirin: Secondary | ICD-10-CM | POA: Diagnosis not present

## 2015-07-17 DIAGNOSIS — I69922 Dysarthria following unspecified cerebrovascular disease: Secondary | ICD-10-CM

## 2015-07-17 LAB — GLUCOSE, POCT (MANUAL RESULT ENTRY): POC GLUCOSE: 357 mg/dL — AB (ref 70–99)

## 2015-07-17 MED ORDER — INSULIN GLARGINE 100 UNIT/ML SOLOSTAR PEN: 20.0000 [IU] | PEN_INJECTOR | Freq: Every day | SUBCUTANEOUS | Status: AC

## 2015-07-17 NOTE — Patient Instructions (Signed)
Diabetes Mellitus and Food It is important for you to manage your blood sugar (glucose) level. Your blood glucose level can be greatly affected by what you eat. Eating healthier foods in the appropriate amounts throughout the day at about the same time each day will help you control your blood glucose level. It can also help slow or prevent worsening of your diabetes mellitus. Healthy eating may even help you improve the level of your blood pressure and reach or maintain a healthy weight.  General recommendations for healthful eating and cooking habits include:  Eating meals and snacks regularly. Avoid going long periods of time without eating to lose weight.  Eating a diet that consists mainly of plant-based foods, such as fruits, vegetables, nuts, legumes, and whole grains.  Using low-heat cooking methods, such as baking, instead of high-heat cooking methods, such as deep frying. Work with your dietitian to make sure you understand how to use the Nutrition Facts information on food labels. HOW CAN FOOD AFFECT ME? Carbohydrates Carbohydrates affect your blood glucose level more than any other type of food. Your dietitian will help you determine how many carbohydrates to eat at each meal and teach you how to count carbohydrates. Counting carbohydrates is important to keep your blood glucose at a healthy level, especially if you are using insulin or taking certain medicines for diabetes mellitus. Alcohol Alcohol can cause sudden decreases in blood glucose (hypoglycemia), especially if you use insulin or take certain medicines for diabetes mellitus. Hypoglycemia can be a life-threatening condition. Symptoms of hypoglycemia (sleepiness, dizziness, and disorientation) are similar to symptoms of having too much alcohol.  If your health care provider has given you approval to drink alcohol, do so in moderation and use the following guidelines:  Women should not have more than one drink per day, and men  should not have more than two drinks per day. One drink is equal to:  12 oz of beer.  5 oz of wine.  1 oz of hard liquor.  Do not drink on an empty stomach.  Keep yourself hydrated. Have water, diet soda, or unsweetened iced tea.  Regular soda, juice, and other mixers might contain a lot of carbohydrates and should be counted. WHAT FOODS ARE NOT RECOMMENDED? As you make food choices, it is important to remember that all foods are not the same. Some foods have fewer nutrients per serving than other foods, even though they might have the same number of calories or carbohydrates. It is difficult to get your body what it needs when you eat foods with fewer nutrients. Examples of foods that you should avoid that are high in calories and carbohydrates but low in nutrients include:  Trans fats (most processed foods list trans fats on the Nutrition Facts label).  Regular soda.  Juice.  Candy.  Sweets, such as cake, pie, doughnuts, and cookies.  Fried foods. WHAT FOODS CAN I EAT? Eat nutrient-rich foods, which will nourish your body and keep you healthy. The food you should eat also will depend on several factors, including:  The calories you need.  The medicines you take.  Your weight.  Your blood glucose level.  Your blood pressure level.  Your cholesterol level. You should eat a variety of foods, including:  Protein.  Lean cuts of meat.  Proteins low in saturated fats, such as fish, egg whites, and beans. Avoid processed meats.  Fruits and vegetables.  Fruits and vegetables that may help control blood glucose levels, such as apples, mangoes, and   yams.  Dairy products.  Choose fat-free or low-fat dairy products, such as milk, yogurt, and cheese.  Grains, bread, pasta, and rice.  Choose whole grain products, such as multigrain bread, whole oats, and brown rice. These foods may help control blood pressure.  Fats.  Foods containing healthful fats, such as nuts,  avocado, olive oil, canola oil, and fish. DOES EVERYONE WITH DIABETES MELLITUS HAVE THE SAME MEAL PLAN? Because every person with diabetes mellitus is different, there is not one meal plan that works for everyone. It is very important that you meet with a dietitian who will help you create a meal plan that is just right for you.   This information is not intended to replace advice given to you by your health care provider. Make sure you discuss any questions you have with your health care provider.   Document Released: 02/07/2005 Document Revised: 06/03/2014 Document Reviewed: 04/09/2013 Elsevier Interactive Patient Education 2016 [employer]  
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
## 2015-07-17 NOTE — Progress Notes (Signed)
Patient here for follow up He has seen cardiologist He has not had any Novolog today

## 2015-07-21 ENCOUNTER — Encounter: Payer: Self-pay | Admitting: Cardiology

## 2015-07-24 ENCOUNTER — Telehealth (HOSPITAL_COMMUNITY): Payer: Self-pay | Admitting: *Deleted

## 2015-07-24 NOTE — Telephone Encounter (Signed)
Patient given detailed instructions per Myocardial Perfusion Study Information Sheet for the test on 3/1/17Patient notified to arrive 15 minutes early and that it is imperative to arrive on time for appointment to keep from having the test rescheduled.  If you need to cancel or reschedule your appointment, please call the office within 24 hours of your appointment. Failure to do so may result in a cancellation of your appointment, and a $50 no show fee. Patient verbalized understanding. Caren Garske J Sanchez Hemmer, RN  

## 2015-07-26 ENCOUNTER — Encounter (HOSPITAL_COMMUNITY): Payer: Medicaid Other

## 2015-07-26 ENCOUNTER — Telehealth: Payer: Self-pay

## 2015-07-26 NOTE — Telephone Encounter (Signed)
This Case Manager placed call to patient to check on status and to discuss scheduling follow-up appointment with Dr. Jarold Song for diabetes mellitus follow-up. Call placed to [phone]; unable to reach patient. HIPPA compliant voicemail left requesting return call.

## 2015-08-01 ENCOUNTER — Telehealth: Payer: Self-pay

## 2015-08-01 NOTE — Telephone Encounter (Signed)
Attempted to contact the patient to check on his status and to discuss scheduling a follow up appointment. Call placed to # [phone] (mobile) and a HIPAA compliant voice mail message was left requesting a call back to # [phone] or [phone].

## 2015-08-09 ENCOUNTER — Ambulatory Visit: Payer: Medicaid Other | Admitting: Internal Medicine

## 2015-08-15 ENCOUNTER — Telehealth (HOSPITAL_COMMUNITY): Payer: Self-pay | Admitting: *Deleted

## 2015-08-15 NOTE — Telephone Encounter (Signed)
Left message on voicemail per DPR in reference to upcoming appointment scheduled on 08/18/15 at 0815 with detailed instructions given per Myocardial Perfusion Study Information Sheet for the test. LM to arrive 15 minutes early, and that it is imperative to arrive on time for appointment to keep from having the test rescheduled. If you need to cancel or reschedule your appointment, please call the office within 24 hours of your appointment. Failure to do so may result in a cancellation of your appointment, and a $50 no show fee. Phone number given for call back for any questions. Markon Jares, Ranae Palms

## 2015-08-18 ENCOUNTER — Ambulatory Visit (HOSPITAL_COMMUNITY): Payer: Medicaid Other | Attending: Cardiovascular Disease

## 2015-08-23 ENCOUNTER — Ambulatory Visit: Payer: Medicaid Other | Admitting: Internal Medicine

## 2015-08-24 ENCOUNTER — Encounter: Payer: Self-pay | Admitting: *Deleted

## 2015-11-05 ENCOUNTER — Other Ambulatory Visit: Payer: Self-pay | Admitting: Family Medicine

## 2016-01-23 ENCOUNTER — Emergency Department (HOSPITAL_COMMUNITY): Payer: Medicaid Other

## 2016-01-23 ENCOUNTER — Encounter (HOSPITAL_COMMUNITY): Payer: Self-pay

## 2016-01-23 ENCOUNTER — Emergency Department (HOSPITAL_COMMUNITY)
Admission: EM | Admit: 2016-01-23 | Discharge: 2016-01-23 | Disposition: A | Discharge status: Home / Self Care | Payer: Medicaid Other | Attending: Emergency Medicine | Admitting: Emergency Medicine

## 2016-01-23 DIAGNOSIS — Z951 Presence of aortocoronary bypass graft: Secondary | ICD-10-CM | POA: Insufficient documentation

## 2016-01-23 DIAGNOSIS — E1122 Type 2 diabetes mellitus with diabetic chronic kidney disease: Secondary | ICD-10-CM | POA: Diagnosis not present

## 2016-01-23 DIAGNOSIS — J40 Bronchitis, not specified as acute or chronic: Secondary | ICD-10-CM | POA: Diagnosis not present

## 2016-01-23 DIAGNOSIS — Z794 Long term (current) use of insulin: Secondary | ICD-10-CM | POA: Insufficient documentation

## 2016-01-23 DIAGNOSIS — Z8673 Personal history of transient ischemic attack (TIA), and cerebral infarction without residual deficits: Secondary | ICD-10-CM | POA: Insufficient documentation

## 2016-01-23 DIAGNOSIS — I252 Old myocardial infarction: Secondary | ICD-10-CM | POA: Insufficient documentation

## 2016-01-23 DIAGNOSIS — I2510 Atherosclerotic heart disease of native coronary artery without angina pectoris: Secondary | ICD-10-CM | POA: Insufficient documentation

## 2016-01-23 DIAGNOSIS — Z955 Presence of coronary angioplasty implant and graft: Secondary | ICD-10-CM | POA: Diagnosis not present

## 2016-01-23 DIAGNOSIS — I130 Hypertensive heart and chronic kidney disease with heart failure and stage 1 through stage 4 chronic kidney disease, or unspecified chronic kidney disease: Secondary | ICD-10-CM | POA: Diagnosis not present

## 2016-01-23 DIAGNOSIS — E860 Dehydration: Secondary | ICD-10-CM | POA: Diagnosis not present

## 2016-01-23 DIAGNOSIS — Z7982 Long term (current) use of aspirin: Secondary | ICD-10-CM | POA: Diagnosis not present

## 2016-01-23 DIAGNOSIS — I5022 Chronic systolic (congestive) heart failure: Secondary | ICD-10-CM | POA: Insufficient documentation

## 2016-01-23 DIAGNOSIS — R05 Cough: Secondary | ICD-10-CM | POA: Diagnosis present

## 2016-01-23 DIAGNOSIS — E1165 Type 2 diabetes mellitus with hyperglycemia: Secondary | ICD-10-CM | POA: Insufficient documentation

## 2016-01-23 DIAGNOSIS — E1140 Type 2 diabetes mellitus with diabetic neuropathy, unspecified: Secondary | ICD-10-CM | POA: Insufficient documentation

## 2016-01-23 DIAGNOSIS — Z79899 Other long term (current) drug therapy: Secondary | ICD-10-CM | POA: Diagnosis not present

## 2016-01-23 DIAGNOSIS — E039 Hypothyroidism, unspecified: Secondary | ICD-10-CM | POA: Insufficient documentation

## 2016-01-23 DIAGNOSIS — R739 Hyperglycemia, unspecified: Secondary | ICD-10-CM

## 2016-01-23 DIAGNOSIS — N182 Chronic kidney disease, stage 2 (mild): Secondary | ICD-10-CM | POA: Insufficient documentation

## 2016-01-23 LAB — COMPREHENSIVE METABOLIC PANEL
ALT: 14 U/L — ABNORMAL LOW (ref 17–63)
AST: 23 U/L (ref 15–41)
Albumin: 3.9 g/dL (ref 3.5–5.0)
Alkaline Phosphatase: 112 U/L (ref 38–126)
Anion gap: 8 (ref 5–15)
BUN: 23 mg/dL — AB (ref 6–20)
CHLORIDE: 98 mmol/L — AB (ref 101–111)
CO2: 26 mmol/L (ref 22–32)
Calcium: 9.3 mg/dL (ref 8.9–10.3)
Creatinine, Ser: 1.45 mg/dL — ABNORMAL HIGH (ref 0.61–1.24)
GFR calc Af Amer: 59 mL/min — ABNORMAL LOW (ref >60)
GFR, EST NON AFRICAN AMERICAN: 51 mL/min — AB (ref >60)
Glucose, Bld: 344 mg/dL — ABNORMAL HIGH (ref 65–99)
POTASSIUM: 3.9 mmol/L (ref 3.5–5.1)
Sodium: 132 mmol/L — ABNORMAL LOW (ref 135–145)
Total Bilirubin: 0.8 mg/dL (ref 0.3–1.2)
Total Protein: 8.5 g/dL — ABNORMAL HIGH (ref 6.5–8.1)

## 2016-01-23 LAB — URINALYSIS, ROUTINE W REFLEX MICROSCOPIC
Bilirubin Urine: NEGATIVE
Glucose, UA: >1000 mg/dL — AB
Ketones, ur: NEGATIVE mg/dL
LEUKOCYTES UA: NEGATIVE
NITRITE: NEGATIVE
Protein, ur: 100 mg/dL — AB
SPECIFIC GRAVITY, URINE: 1.032 — AB (ref 1.005–1.030)
pH: 6 (ref 5.0–8.0)

## 2016-01-23 LAB — CBG MONITORING, ED
GLUCOSE-CAPILLARY: 107 mg/dL — AB (ref 65–99)
Glucose-Capillary: 352 mg/dL — ABNORMAL HIGH (ref 65–99)

## 2016-01-23 LAB — URINE MICROSCOPIC-ADD ON

## 2016-01-23 LAB — CBC WITH DIFFERENTIAL/PLATELET
BASOS ABS: 0 10*3/uL (ref 0.0–0.1)
Basophils Relative: 0 %
Eosinophils Absolute: 0.2 10*3/uL (ref 0.0–0.7)
Eosinophils Relative: 4 %
HEMATOCRIT: 41.4 % (ref 39.0–52.0)
HEMOGLOBIN: 13.9 g/dL (ref 13.0–17.0)
LYMPHS PCT: 24 %
Lymphs Abs: 1.1 10*3/uL (ref 0.7–4.0)
MCH: 30.3 pg (ref 26.0–34.0)
MCHC: 33.6 g/dL (ref 30.0–36.0)
MCV: 90.4 fL (ref 78.0–100.0)
Monocytes Absolute: 0.4 10*3/uL (ref 0.1–1.0)
Monocytes Relative: 8 %
NEUTROS ABS: 3 10*3/uL (ref 1.7–7.7)
NEUTROS PCT: 64 %
Platelets: 123 10*3/uL — ABNORMAL LOW (ref 150–400)
RBC: 4.58 MIL/uL (ref 4.22–5.81)
RDW: 12.6 % (ref 11.5–15.5)
WBC: 4.6 10*3/uL (ref 4.0–10.5)

## 2016-01-23 LAB — I-STAT CG4 LACTIC ACID, ED
LACTIC ACID, VENOUS: 1.13 mmol/L (ref 0.5–1.9)
LACTIC ACID, VENOUS: 0.77 mmol/L (ref 0.5–1.9)

## 2016-01-23 MED ORDER — ALBUTEROL SULFATE HFA 108 (90 BASE) MCG/ACT IN AERS: 2.0000 | INHALATION_SPRAY | Freq: Four times a day (QID) | RESPIRATORY_TRACT | 0 refills | Status: DC | PRN

## 2016-01-23 MED ORDER — BENZONATATE 100 MG PO CAPS: 100.0000 mg | ORAL_CAPSULE | Freq: Three times a day (TID) | ORAL | 0 refills | Status: DC

## 2016-01-23 MED ORDER — AZITHROMYCIN 250 MG PO TABS: 250.0000 mg | ORAL_TABLET | Freq: Every day | ORAL | 0 refills | Status: DC

## 2016-01-23 MED ORDER — AZITHROMYCIN 250 MG PO TABS
500.0000 mg | ORAL_TABLET | Freq: Once | ORAL | Status: AC
  Administered 2016-01-23: 500 mg via ORAL
  Filled 2016-01-23: qty 2

## 2016-01-23 MED ORDER — SODIUM CHLORIDE 0.9 % IV BOLUS (SEPSIS)
1000.0000 mL | Freq: Once | INTRAVENOUS | Status: AC
  Administered 2016-01-23: 1000 mL via INTRAVENOUS

## 2016-01-23 MED ORDER — INSULIN ASPART 100 UNIT/ML ~~LOC~~ SOLN
10.0000 [IU] | Freq: Once | SUBCUTANEOUS | Status: AC
  Administered 2016-01-23: 10 [IU] via INTRAVENOUS
  Filled 2016-01-23: qty 1

## 2016-01-23 MED ORDER — DOXYCYCLINE HYCLATE 100 MG PO CAPS: 100.0000 mg | ORAL_CAPSULE | Freq: Two times a day (BID) | ORAL | 0 refills | Status: DC

## 2016-01-23 NOTE — ED Notes (Signed)
Carb modified breakfast tray ordered for patient.

## 2016-01-23 NOTE — ED Notes (Signed)
Pt CBG = 352 at triage.

## 2016-01-23 NOTE — ED Notes (Signed)
Ordered diet tray 

## 2016-01-23 NOTE — ED Provider Notes (Signed)
[facility] DEPT Provider Note   CSN: [id number] Arrival date & time: 01/23/16  0202     History   Chief Complaint Chief Complaint  Patient presents with  . URI  . Hyperglycemia    HPI Jackson Shaw is a 60 y.o. male.  HPI   Jackson Shaw is a 60 y.o. male, with a history of HIV, DM, MI, and HTN, presenting to the ED with a productive cough with yellow sputum worsening over the last week.   Hyperglycemia: Pt states he takes his BG everyday and usually runs around 150-185. Pt last took his Lantus around 36 hours ago, stating he forgot to take it last night. Last Novolog was 6 units yesterday afternoon. Previous records review patient tends to run over 300. Last HbA1C was 11.8 in January 2017. Denies CP, SOB, N/V, dizziness, or any other complaints.   Patient's last recorded CD4 count and viral load were measured in January 2017; 410 and 950, respectively. Pt sees Dr. Debby Freiberg, [facility] infectious disease clinic. States his last visit was around 4 months ago and that is viral load was undetectable and his CD4 count was normal, but could not remember the exact value.  Past Medical History:  Diagnosis Date  . Anginal pain ([place])   . Anxiety   . Aspiration pneumonia ([place]) 06/27/2015  . Chronic systolic CHF (congestive heart failure) ([place])    a. 12/2013 Echo: EF 45%;  b. 03/2015 Echo: EF 35%, mild MR, mildly dil LA.  Marland Kitchen Coronary artery disease    a. s/p prior CABG;  b. 03/2015 NSTEMI/PCI: 100ost CTO, LAD 100ost/p, 45d, D1 100/95ost, RI 75, LCX 100p, OM1/3 small, RCA 100ost->distal, RPDA/RPAV small,RPA1 80, RPA2 90, RPLB 80, RPLB2 80,  VG->D1->RI 10p, patent stents, VG->RPDA->RPLB3 99 before RPDA (3.5x24 Promus DES), 100 between PDA& RPLB3 (CTO), LIMA->LAD nl.  . Diabetic neuropathy ([place]) 12/22/2014  . GERD (gastroesophageal reflux disease)   . History of duodenal ulcer   . History of hiatal hernia   . HIV (human immunodeficiency virus infection) ([place]) a. Dx in 1980's.  Marland Kitchen Hyperlipidemia    . Hypertension   . Hypothyroidism   . 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 Kidney stones   . MI (myocardial infarction) ([place])    "I've had 4" (06/27/2015)  . NSTEMI (non-ST elevated myocardial infarction) ([place]) 03/2015  . Pneumonia "several times"  . Stroke [facility])    a. 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  Marland Kitchen Syncope and collapse    a. 12/2013 s/p MDT Linq ILR.  Marland Kitchen Type II diabetes mellitus [facility])     Patient Active Problem List   Diagnosis Date Noted  . History of stroke 07/17/2015  . 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 Hypertension 07/17/2015  . Hyperlipidemia 07/17/2015  . Bilateral pleural effusion 06/27/2015  . Aspiration pneumonia ([place]) 06/27/2015  . Acute respiratory distress (HCC) 06/27/2015  . Ischemic cardiomyopathy/EF 25% 06/27/2015  . Aspiration pneumonia of right middle lobe ([place])   . NSTEMI (non-ST elevated myocardial infarction) ([place]) 04/19/2015  . Coronary artery disease involving coronary bypass graft of native heart with unstable angina pectoris ([place])   . History of CVA (cerebrovascular accident) 04/05/2015  . Diabetic neuropathy ([place]) 12/22/2014  . Syncope 08/08/2014  . Status post placement of implantable loop recorder 08/08/2014  . Chest pain 08/08/2014  . 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 Human immunodeficiency virus (HIV) disease ([place]) 10/12/2008  . [place] SARCOMA 10/12/2008  .  Hypothyroidism 10/12/2008  . Insulin dependent type 2 diabetes mellitus, uncontrolled ([place]) 10/12/2008  . HYPERCHOLESTEROLEMIA 10/12/2008  . S/P CABG x 5 10/12/2008  . GASTROESOPHAGEAL REFLUX DISEASE 10/12/2008  . RENAL DISEASE, CHRONIC, STAGE II 10/12/2008    Past Surgical History:  Procedure Laterality Date  . CARDIAC CATHETERIZATION N/A 04/19/2015   Procedure: Left Heart Cath and Coronary Angiography;  Surgeon: Leonie Man, MD;  Location: [facility] CV LAB;  Service: Cardiovascular;  Laterality:  N/A;  . CARDIAC CATHETERIZATION N/A 04/19/2015   Procedure: Coronary Stent Intervention;  Surgeon: Leonie Man, MD;  Location: [facility] CV LAB;  Service: Cardiovascular;  Laterality: N/A;  . CORONARY ANGIOPLASTY WITH STENT PLACEMENT  "3-4 times"   "I've got several stents"  . CORONARY ARTERY BYPASS GRAFT  1980s   "CABG X5; Dr. Lia Foyer"  . CYSTOSCOPY W/ STONE MANIPULATION  1980s  . LOOP RECORDER IMPLANT     Medtronic Reveal Model # [id number] MR safe       Home Medications    Prior to Admission medications   Medication Sig Start Date End Date Taking? Authorizing Provider  aspirin EC 81 MG tablet Take 1 tablet by mouth daily. 08/12/12  Yes Historical Provider, MD  atorvastatin (LIPITOR) 20 MG tablet Take 1 tablet (20 mg total) by mouth daily. 07/06/15  Yes Brittainy Erie Noe, PA-C  citalopram (CELEXA) 10 MG tablet Take 1 tablet by mouth daily.   Yes Historical Provider, MD  clopidogrel (PLAVIX) 75 MG tablet Take 1 tablet (75 mg total) by mouth daily. 07/06/15  Yes Brittainy Erie Noe, PA-C  Darunavir Ethanolate (PREZISTA) 800 MG tablet Take 1 tablet by mouth daily.   Yes Historical Provider, MD  dolutegravir (TIVICAY) 50 MG tablet Take 1 tablet by mouth daily.   Yes Historical Provider, MD  emtricitabine-tenofovir (TRUVADA) 200-300 MG per tablet Take 1 tablet by mouth daily.   Yes Historical Provider, MD  feeding supplement, ENSURE ENLIVE, (ENSURE ENLIVE) LIQD Take 237 mLs by mouth 2 (two) times daily between meals. 06/29/15  Yes Belkys A Regalado, MD  gabapentin (NEURONTIN) 300 MG capsule One capsule at night for 2 weeks, then take one capsule twice a day Patient taking differently: Take 300 mg by mouth 2 (two) times daily.  12/22/14  Yes Kathrynn Ducking, MD  insulin aspart (NOVOLOG) 100 UNIT/ML injection Inject 6 Units into the skin 3 (three) times daily with meals. 6 units is the base. Add 2 units for each 15gm of carbs   Yes Historical Provider, MD  Insulin Glargine (LANTUS SOLOSTAR) 100  UNIT/ML Solostar Pen Inject 20 Units into the skin daily at 10 pm. 07/17/15  Yes Arnoldo Morale, MD  isosorbide mononitrate (IMDUR) 30 MG 24 hr tablet Take 1 tablet (30 mg total) by mouth daily. 07/06/15  Yes Brittainy Erie Noe, PA-C  levothyroxine (SYNTHROID, LEVOTHROID) 75 MCG tablet Take 4 tablets (300 mcg total) by mouth daily before breakfast. Must have office visit for refills 11/06/15  Yes Arnoldo Morale, MD  metoprolol succinate (TOPROL XL) 25 MG 24 hr tablet Take 1 tablet (25 mg total) by mouth daily. 07/06/15  Yes Brittainy Erie Noe, PA-C  nitroGLYCERIN (NITROSTAT) 0.4 MG SL tablet Place 1 tablet (0.4 mg total) under the tongue every 5 (five) minutes as needed for chest pain. 04/20/15  Yes Rogelia Mire, NP  pantoprazole (PROTONIX) 40 MG tablet Take 1 tablet by mouth daily.   Yes Historical Provider, MD  ritonavir (NORVIR) 100 MG capsule Take 1 capsule by mouth  daily.   Yes Historical Provider, MD  albuterol (PROVENTIL HFA;VENTOLIN HFA) 108 (90 Base) MCG/ACT inhaler Inhale 2 puffs into the lungs every 6 (six) hours as needed for wheezing or shortness of breath. 01/23/16   Caliya Narine C Adreanna Fickel, PA-C  benzonatate (TESSALON) 100 MG capsule Take 1 capsule (100 mg total) by mouth every 8 (eight) hours. 01/23/16   Naasia Weilbacher C Kairon Shock, PA-C  doxycycline (VIBRAMYCIN) 100 MG capsule Take 1 capsule (100 mg total) by mouth 2 (two) times daily. 01/23/16   Jachob Mcclean C Mykaila Blunck, PA-C  Insulin Pen Needle 31G X 5 MM MISC 1 each by Does not apply route at bedtime. 07/03/15   Arnoldo Morale, MD    Family History Family History  Problem Relation Age of Onset  . Heart disease Mother   . Diabetes Mother   . Cancer Mother   . Heart attack Father   . Stroke Sister     ICH  . Diabetes Sister     Social History Social History  Substance Use Topics  . Smoking status: Never Smoker  . Smokeless tobacco: Never Used  . Alcohol use No     Allergies   Penicillins and Codeine   Review of Systems Review of Systems  Constitutional:  Negative for chills, diaphoresis and fever.  Respiratory: Positive for cough. Negative for shortness of breath.   Cardiovascular: Negative for chest pain.  Gastrointestinal: Negative for nausea and vomiting.  Endocrine:       Hyperglycemia  All other systems reviewed and are negative.    Physical Exam Updated Vital Signs BP 155/83   Pulse 75   Temp 98.5 F (36.9 C) (Oral)   Resp 17   Ht 5' 8.5" (1.74 m)   Wt 73.9 kg   SpO2 95%   BMI 24.42 kg/m   Physical Exam  Constitutional: He appears well-developed and well-nourished. No distress.  HENT:  Head: Normocephalic and atraumatic.  Eyes: Conjunctivae are normal.  Neck: Neck supple.  Cardiovascular: Normal rate, regular rhythm, normal heart sounds and intact distal pulses.   Pulmonary/Chest: Effort normal and breath sounds normal. No respiratory distress.  Abdominal: Soft. There is no tenderness. There is no guarding.  Musculoskeletal: He exhibits no edema or tenderness.  Lymphadenopathy:    He has no cervical adenopathy.  Neurological: He is alert.  Skin: Skin is warm and dry. He is not diaphoretic.  Psychiatric: He has a normal mood and affect. His behavior is normal.  Nursing note and vitals reviewed.    ED Treatments / Results  Labs (all labs ordered are listed, but only abnormal results are displayed) Labs Reviewed  COMPREHENSIVE METABOLIC PANEL - Abnormal; Notable for the following:       Result Value   Sodium 132 (*)    Chloride 98 (*)    Glucose, Bld 344 (*)    BUN 23 (*)    Creatinine, Ser 1.45 (*)    Total Protein 8.5 (*)    ALT 14 (*)    GFR calc non Af Amer 51 (*)    GFR calc Af Amer 59 (*)    All other components within normal limits  URINALYSIS, ROUTINE W REFLEX MICROSCOPIC (NOT AT [facility]) - Abnormal; Notable for the following:    Specific Gravity, Urine 1.032 (*)    Glucose, UA >1000 (*)    Hgb urine dipstick SMALL (*)    Protein, ur 100 (*)    All other components within normal limits  CBC WITH  DIFFERENTIAL/PLATELET - Abnormal; Notable  for the following:    Platelets 123 (*)    All other components within normal limits  URINE MICROSCOPIC-ADD ON - Abnormal; Notable for the following:    Squamous Epithelial / LPF 0-5 (*)    Bacteria, UA RARE (*)    All other components within normal limits  CBG MONITORING, ED - Abnormal; Notable for the following:    Glucose-Capillary 352 (*)    All other components within normal limits  CBG MONITORING, ED - Abnormal; Notable for the following:    Glucose-Capillary 107 (*)    All other components within normal limits  URINE CULTURE  I-STAT CG4 LACTIC ACID, ED  I-STAT CG4 LACTIC ACID, ED    EKG  EKG Interpretation  Date/Time:  Tuesday January 23 2016 08:22:07 EDT Ventricular Rate:  80 PR Interval:    QRS Duration: 106 QT Interval:  422 QTC Calculation: 487 R Axis:   -29 Text Interpretation:  Sinus rhythm Borderline left axis deviation Nonspecific repol abnormality, diffuse leads Borderline prolonged QT interval Confirmed by Gilford Raid MD, JULIE ([id number]) on 01/23/2016 8:27:40 AM Also confirmed by Gilford Raid MD, JULIE ([id number]), editor [place], Leda Gauze [phone])  on 01/23/2016 9:16:12 AM       Radiology Dg Chest 2 View  Result Date: 01/23/2016 CLINICAL DATA:  Cough tonight. History of open-heart surgery and HIV. EXAM: CHEST  2 VIEW COMPARISON:  06/27/2015 FINDINGS: Postoperative changes in the mediastinum. Loop recorder device. Normal heart size and pulmonary vascularity. Bronchial wall thickening and central interstitial changes in the lungs suggesting airways disease. No focal consolidation. No blunting of costophrenic angles. No pneumothorax. Mediastinal contours appear intact. IMPRESSION: Bronchial thickening and central interstitial changes suggesting airways disease. No focal consolidation. Electronically Signed   By: Lucienne Capers M.D.   On: 01/23/2016 02:38    Procedures Procedures (including critical care time)  Medications Ordered in  ED Medications  sodium chloride 0.9 % bolus 1,000 mL (0 mLs Intravenous Stopped 01/23/16 0802)  azithromycin (ZITHROMAX) tablet 500 mg (500 mg Oral Given 01/23/16 0703)  insulin aspart (novoLOG) injection 10 Units (10 Units Intravenous Given 01/23/16 0703)     Initial Impression / Assessment and Plan / ED Course  I have reviewed the triage vital signs and the nursing notes.  Pertinent labs & imaging results that were available during my care of the patient were reviewed by me and considered in my medical decision making (see chart for details).  Clinical Course    Grier Souder presents with a productive cough worsening over the last week. Patient is also hyperglycemic.  Findings and plan of care discussed with Orpah Greek, MD.   Patient reports adequate control of his HIV. Evidence of bronchitis on chest x-ray. EKG shows borderline QT prolongation thus making azithromycin a less than optimal selection. Patient is nontoxic appearing, afebrile, not tachycardic, not tachypneic, not hypotensive, maintains adequate SPO2 on room air, and is in no apparent distress. Patient has no signs of sepsis or other serious or life-threatening condition. Treated his hyperglycemia and dehydration. Patient states he feels much better following the IV fluids. Patient was given a full meal here in the ED. The patient was given instructions for home care as well as return precautions. Patient voices understanding of these instructions, accepts the plan, and is comfortable with discharge.   Vitals:   01/23/16 0545 01/23/16 0600 01/23/16 0630 01/23/16 0645  BP: 161/85 155/83 150/94 167/95  Pulse: 75 75 76 77  Resp: 17 17 18 21   Temp:  TempSrc:      SpO2: 96% 95% 95% 95%  Weight:      Height:       Vitals:   01/23/16 0830 01/23/16 0845 01/23/16 0900 01/23/16 1011  BP: 153/80 153/86 161/84 153/84  Pulse: 78 79 80 87  Resp: 18 16 18 16   Temp:    99.2 F (37.3 C)  TempSrc:    Oral  SpO2: 99%  96% 98% 99%  Weight:      Height:         Final Clinical Impressions(s) / ED Diagnoses   Final diagnoses:  Bronchitis  Hyperglycemia  Dehydration    New Prescriptions Discharge Medication List as of 01/23/2016  8:49 AM    START taking these medications   Details  albuterol (PROVENTIL HFA;VENTOLIN HFA) 108 (90 Base) MCG/ACT inhaler Inhale 2 puffs into the lungs every 6 (six) hours as needed for wheezing or shortness of breath., Starting Tue 01/23/2016, Print    doxycycline (VIBRAMYCIN) 100 MG capsule Take 1 capsule (100 mg total) by mouth 2 (two) times daily., Starting Tue 01/23/2016, Print         Lorayne Bender, PA-C 01/23/16 1158    Orpah Greek, MD 01/24/16 [phone]

## 2016-01-23 NOTE — ED Triage Notes (Addendum)
Pt complaining of upper respiratory infection. Pt states coughing x 1 week. Pt states yellow sputum. Pt states fever of 101 at home. Pt states is HIV positive. Pt afebrile at triage. States took 2 tylenol this evening.

## 2016-01-23 NOTE — Discharge Instructions (Signed)
You have been seen today for a cough. Chest x-ray shows evidence of bronchitis. Please take all of your antibiotics until finished!   You may develop abdominal discomfort or diarrhea from the antibiotic.  You may help offset this with probiotics which you can buy or get in yogurt. Do not eat or take the probiotics until 2 hours after your antibiotic.   You were also found to be hyperglycemic. Continue eating and drinking as you normally would. Also be sure to take your insulin, as prescribed. Follow up with PCP as soon as possible for reevaluation and chronic management of these issues. Return to ED should symptoms worsen.  Doxycycline for bronchitis. Tessalon for cough. Albuterol should you have any shortness of breath.

## 2016-01-24 LAB — URINE CULTURE: Culture: <10000 — AB

## 2016-04-24 ENCOUNTER — Observation Stay (HOSPITAL_COMMUNITY)
Admission: EM | Admit: 2016-04-24 | Discharge: 2016-04-26 | Disposition: A | Discharge status: Home / Self Care | Payer: Medicaid Other | Attending: Internal Medicine | Admitting: Internal Medicine

## 2016-04-24 ENCOUNTER — Emergency Department (HOSPITAL_COMMUNITY): Payer: Medicaid Other

## 2016-04-24 DIAGNOSIS — E7800 Pure hypercholesterolemia, unspecified: Secondary | ICD-10-CM | POA: Diagnosis not present

## 2016-04-24 DIAGNOSIS — I10 Essential (primary) hypertension: Secondary | ICD-10-CM | POA: Diagnosis present

## 2016-04-24 DIAGNOSIS — Z87442 Personal history of urinary calculi: Secondary | ICD-10-CM | POA: Diagnosis not present

## 2016-04-24 DIAGNOSIS — Z7902 Long term (current) use of antithrombotics/antiplatelets: Secondary | ICD-10-CM | POA: Diagnosis not present

## 2016-04-24 DIAGNOSIS — Z8673 Personal history of transient ischemic attack (TIA), and cerebral infarction without residual deficits: Secondary | ICD-10-CM | POA: Diagnosis not present

## 2016-04-24 DIAGNOSIS — I25110 Atherosclerotic heart disease of native coronary artery with unstable angina pectoris: Secondary | ICD-10-CM | POA: Diagnosis not present

## 2016-04-24 DIAGNOSIS — Z885 Allergy status to narcotic agent status: Secondary | ICD-10-CM | POA: Diagnosis not present

## 2016-04-24 DIAGNOSIS — E039 Hypothyroidism, unspecified: Secondary | ICD-10-CM | POA: Diagnosis present

## 2016-04-24 DIAGNOSIS — E1165 Type 2 diabetes mellitus with hyperglycemia: Secondary | ICD-10-CM | POA: Insufficient documentation

## 2016-04-24 DIAGNOSIS — Z21 Asymptomatic human immunodeficiency virus [HIV] infection status: Secondary | ICD-10-CM | POA: Diagnosis not present

## 2016-04-24 DIAGNOSIS — K219 Gastro-esophageal reflux disease without esophagitis: Secondary | ICD-10-CM | POA: Diagnosis not present

## 2016-04-24 DIAGNOSIS — F329 Major depressive disorder, single episode, unspecified: Secondary | ICD-10-CM | POA: Insufficient documentation

## 2016-04-24 DIAGNOSIS — Z951 Presence of aortocoronary bypass graft: Secondary | ICD-10-CM | POA: Diagnosis not present

## 2016-04-24 DIAGNOSIS — Z794 Long term (current) use of insulin: Secondary | ICD-10-CM | POA: Insufficient documentation

## 2016-04-24 DIAGNOSIS — I639 Cerebral infarction, unspecified: Secondary | ICD-10-CM | POA: Diagnosis present

## 2016-04-24 DIAGNOSIS — E512 Wernicke's encephalopathy: Secondary | ICD-10-CM | POA: Diagnosis not present

## 2016-04-24 DIAGNOSIS — N182 Chronic kidney disease, stage 2 (mild): Secondary | ICD-10-CM | POA: Diagnosis present

## 2016-04-24 DIAGNOSIS — Z9889 Other specified postprocedural states: Secondary | ICD-10-CM | POA: Insufficient documentation

## 2016-04-24 DIAGNOSIS — Z95818 Presence of other cardiac implants and grafts: Secondary | ICD-10-CM | POA: Diagnosis present

## 2016-04-24 DIAGNOSIS — I5022 Chronic systolic (congestive) heart failure: Secondary | ICD-10-CM | POA: Insufficient documentation

## 2016-04-24 DIAGNOSIS — I252 Old myocardial infarction: Secondary | ICD-10-CM | POA: Diagnosis not present

## 2016-04-24 DIAGNOSIS — I638 Other cerebral infarction: Principal | ICD-10-CM | POA: Insufficient documentation

## 2016-04-24 DIAGNOSIS — E1140 Type 2 diabetes mellitus with diabetic neuropathy, unspecified: Secondary | ICD-10-CM | POA: Insufficient documentation

## 2016-04-24 DIAGNOSIS — F419 Anxiety disorder, unspecified: Secondary | ICD-10-CM | POA: Diagnosis not present

## 2016-04-24 DIAGNOSIS — I130 Hypertensive heart and chronic kidney disease with heart failure and stage 1 through stage 4 chronic kidney disease, or unspecified chronic kidney disease: Secondary | ICD-10-CM | POA: Insufficient documentation

## 2016-04-24 DIAGNOSIS — IMO0002 Reserved for concepts with insufficient information to code with codable children: Secondary | ICD-10-CM

## 2016-04-24 DIAGNOSIS — E1122 Type 2 diabetes mellitus with diabetic chronic kidney disease: Secondary | ICD-10-CM | POA: Diagnosis not present

## 2016-04-24 DIAGNOSIS — Z880 Allergy status to penicillin: Secondary | ICD-10-CM | POA: Diagnosis not present

## 2016-04-24 DIAGNOSIS — I25700 Atherosclerosis of coronary artery bypass graft(s), unspecified, with unstable angina pectoris: Secondary | ICD-10-CM | POA: Diagnosis present

## 2016-04-24 DIAGNOSIS — B20 Human immunodeficiency virus [HIV] disease: Secondary | ICD-10-CM | POA: Diagnosis present

## 2016-04-24 DIAGNOSIS — Z7982 Long term (current) use of aspirin: Secondary | ICD-10-CM | POA: Insufficient documentation

## 2016-04-24 NOTE — ED Provider Notes (Signed)
TIME SEEN:  By signing my name below, I, Arianna Nassar, attest that this documentation has been prepared under the direction and in the presence of [employer], DO.  Electronically Signed: Julien Nordmann, ED Scribe. 04/25/16. 12:18 AM.   CHIEF COMPLAINT:  Chief Complaint  Patient presents with  . Code Stroke     HPI:  HPI Comments: Jackson Shaw is a 60 y.o. male brought in by ambulance, who has a PMhx of CHF, CAD, HIV, HLD, HTN, DM, CABG x5, hypothyroidism, MI presents to the Emergency Department presenting with stroke like symptoms that started around 11:15 PM this evening. He notes feeling mild left facial tingling, left sided facial droop that has resolved, and slurred speech. Pt says he normally has slurred speech whenever he is tired but states this current episode is worse than normal. He feels like his left-sided facial droop and numbness have improved. He expresses that he has been under more stress than usual over the past 4-5 months. Pt had a left heart catheterization performed 11/23 by Dr. Ellyn Hack. He currently takes ASA daily and he is on plavix. Pt is compliant with all of his medication. His last CD4 count was in the 500-600s. Viral load was undetectable. His infectious disease physician is at [facility]Reports compliance with all of his medications. Has had a history of Kaposi's sarcoma and PCP.  Infectious Disease: Dr. Debby Freiberg PCP: Arnoldo Morale, MD   ROS: See HPI Constitutional: no fever  Eyes: no drainage  ENT: no runny nose   Cardiovascular:  no chest pain  Resp: no SOB  GI: no vomiting GU: no dysuria Integumentary: no rash  Allergy: no hives  Musculoskeletal: no leg swelling  Neurological: + slurred speech ROS otherwise negative  PAST MEDICAL HISTORY/PAST SURGICAL HISTORY:  Past Medical History:  Diagnosis Date  . Anginal pain ([place])   . Anxiety   . Aspiration pneumonia ([place]) 06/27/2015  . Chronic systolic CHF (congestive heart failure) ([place])    a. 12/2013  Echo: EF 45%;  b. 03/2015 Echo: EF 35%, mild MR, mildly dil LA.  Marland Kitchen Coronary artery disease    a. s/p prior CABG;  b. 03/2015 NSTEMI/PCI: 100ost CTO, LAD 100ost/p, 45d, D1 100/95ost, RI 75, LCX 100p, OM1/3 small, RCA 100ost->distal, RPDA/RPAV small,RPA1 80, RPA2 90, RPLB 80, RPLB2 80,  VG->D1->RI 10p, patent stents, VG->RPDA->RPLB3 99 before RPDA (3.5x24 Promus DES), 100 between PDA& RPLB3 (CTO), LIMA->LAD nl.  . Diabetic neuropathy ([place]) 12/22/2014  . GERD (gastroesophageal reflux disease)   . History of duodenal ulcer   . History of hiatal hernia   . HIV (human immunodeficiency virus infection) ([place]) a. Dx in 1980's.  Marland Kitchen Hyperlipidemia   . Hypertension   . Hypothyroidism   . 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 Kidney stones   . MI (myocardial infarction)    "I've had 4" (06/27/2015)  . NSTEMI (non-ST elevated myocardial infarction) ([place]) 03/2015  . Pneumonia "several times"  . Stroke [facility])    a. 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  Marland Kitchen Syncope and collapse    a. 12/2013 s/p MDT Linq ILR.  Marland Kitchen Type II diabetes mellitus (HCC)     MEDICATIONS:  Prior to Admission medications   Medication Sig Start Date End Date Taking? Authorizing Provider  albuterol (PROVENTIL HFA;VENTOLIN HFA) 108 (90 Base) MCG/ACT inhaler Inhale 2 puffs into the lungs every 6 (six) hours as needed for wheezing or shortness of breath. 01/23/16   Lorayne Bender,  PA-C  aspirin EC 81 MG tablet Take 1 tablet by mouth daily. 08/12/12   Historical Provider, MD  atorvastatin (LIPITOR) 20 MG tablet Take 1 tablet (20 mg total) by mouth daily. 07/06/15   Brittainy Erie Noe, PA-C  benzonatate (TESSALON) 100 MG capsule Take 1 capsule (100 mg total) by mouth every 8 (eight) hours. 01/23/16   Shawn C Joy, PA-C  citalopram (CELEXA) 10 MG tablet Take 1 tablet by mouth daily.    Historical Provider, MD  clopidogrel (PLAVIX) 75 MG tablet Take 1 tablet (75 mg total) by mouth daily. 07/06/15   Brittainy Erie Noe, PA-C   Darunavir Ethanolate (PREZISTA) 800 MG tablet Take 1 tablet by mouth daily.    Historical Provider, MD  dolutegravir (TIVICAY) 50 MG tablet Take 1 tablet by mouth daily.    Historical Provider, MD  doxycycline (VIBRAMYCIN) 100 MG capsule Take 1 capsule (100 mg total) by mouth 2 (two) times daily. 01/23/16   Shawn C Joy, PA-C  emtricitabine-tenofovir (TRUVADA) 200-300 MG per tablet Take 1 tablet by mouth daily.    Historical Provider, MD  feeding supplement, ENSURE ENLIVE, (ENSURE ENLIVE) LIQD Take 237 mLs by mouth 2 (two) times daily between meals. 06/29/15   Belkys A Regalado, MD  gabapentin (NEURONTIN) 300 MG capsule One capsule at night for 2 weeks, then take one capsule twice a day Patient taking differently: Take 300 mg by mouth 2 (two) times daily.  12/22/14   Kathrynn Ducking, MD  insulin aspart (NOVOLOG) 100 UNIT/ML injection Inject 6 Units into the skin 3 (three) times daily with meals. 6 units is the base. Add 2 units for each 15gm of carbs    Historical Provider, MD  Insulin Glargine (LANTUS SOLOSTAR) 100 UNIT/ML Solostar Pen Inject 20 Units into the skin daily at 10 pm. 07/17/15   Arnoldo Morale, MD  Insulin Pen Needle 31G X 5 MM MISC 1 each by Does not apply route at bedtime. 07/03/15   Arnoldo Morale, MD  isosorbide mononitrate (IMDUR) 30 MG 24 hr tablet Take 1 tablet (30 mg total) by mouth daily. 07/06/15   Brittainy Erie Noe, PA-C  levothyroxine (SYNTHROID, LEVOTHROID) 75 MCG tablet Take 4 tablets (300 mcg total) by mouth daily before breakfast. Must have office visit for refills 11/06/15   Arnoldo Morale, MD  metoprolol succinate (TOPROL XL) 25 MG 24 hr tablet Take 1 tablet (25 mg total) by mouth daily. 07/06/15   Brittainy Erie Noe, PA-C  nitroGLYCERIN (NITROSTAT) 0.4 MG SL tablet Place 1 tablet (0.4 mg total) under the tongue every 5 (five) minutes as needed for chest pain. 04/20/15   Rogelia Mire, NP  pantoprazole (PROTONIX) 40 MG tablet Take 1 tablet by mouth daily.    Historical  Provider, MD  ritonavir (NORVIR) 100 MG capsule Take 1 capsule by mouth daily.    Historical Provider, MD    ALLERGIES:  Allergies  Allergen Reactions  . Penicillins Anaphylaxis  . Codeine Nausea Only    SOCIAL HISTORY:  Social History  Substance Use Topics  . Smoking status: Never Smoker  . Smokeless tobacco: Never Used  . Alcohol use No    FAMILY HISTORY: Family History  Problem Relation Age of Onset  . Heart disease Mother   . Diabetes Mother   . Cancer Mother   . Heart attack Father   . Stroke Sister     ICH  . Diabetes Sister     EXAM: BP 149/96 (BP Location: Right Arm)   Pulse  64   Temp 97.9 F (36.6 C) (Oral)   Resp 18   Ht 5\' 8"  (1.727 m)   Wt 164 lb 7.4 oz (74.6 kg)   SpO2 98%   BMI 25.01 kg/m  CONSTITUTIONAL: Alert and oriented x 3 and responds appropriately to questions. Well-appearing; well-nourished HEAD: Normocephalic EYES: Conjunctivae clear, PERRL, EOMI ENT: normal nose; no rhinorrhea; moist mucous membranes NECK: Supple, no meningismus, no nuchal rigidity, no LAD  CARD: RRR; S1 and S2 appreciated; no murmurs, no clicks, no rubs, no gallops RESP: Normal chest excursion without splinting or tachypnea; breath sounds clear and equal bilaterally; no wheezes, no rhonchi, no rales, no hypoxia or respiratory distress, speaking full sentences ABD/GI: Normal bowel sounds; non-distended; soft, non-tender, no rebound, no guarding, no peritoneal signs, no hepatosplenomegaly BACK:  The back appears normal and is non-tender to palpation, there is no CVA tenderness EXT: Normal ROM in all joints; non-tender to palpation; no edema; normal capillary refill; no cyanosis, no calf tenderness or swelling    SKIN: Normal color for age and race; warm; no rash NEURO: Moves all extremities equally, sensation to light touch intact diffusely, cranial nerves II through XII intact, no aphasia, mild dysarthria  PSYCH: The patient's mood and manner are appropriate. Grooming and  personal hygiene are appropriate.  MEDICAL DECISION MAKING: Patient here as a code stroke. His symptoms seem to have improved except for mild slurred speech. His NIH stroke scale would be 1. He is not a TPA candidate given symptoms are improving and low stroke scale. Head CT shows no acute abnormality. Labs, urine pending. We'll discuss with medicine for admission. Patient has been seen by Dr. Nicole Kindred who recommends medical admission for stroke workup.   EKG Interpretation  Date/Time:  Thursday April 25 2016 00:03:31 EST Ventricular Rate:  68 PR Interval:    QRS Duration: 106 QT Interval:  449 QTC Calculation: 478 R Axis:   -15 Text Interpretation:  Sinus rhythm Consider left atrial enlargement Borderline left axis deviation Abnormal T, consider ischemia, lateral leads Borderline ST elevation, anterior leads No significant change since last tracing Confirmed by Jenaye Rickert,  DO, Emogene Muratalla [id number]) on 04/25/2016 12:16:42 AM        ED PROGRESS: 12:45 AM  Labs unremarkable.  Discussed patient's case with hospitatlist, Dr. Blaine Hamper.  Recommend admission to tele, obs bed.  I will place holding orders per their request. Patient and family (if present) updated with plan. Care transferred to hispitalist service.  I reviewed all nursing notes, vitals, pertinent old records, EKGs, labs, imaging (as available).     I personally performed the services described in this documentation, which was scribed in my presence. The recorded information has been reviewed and is accurate.     [place], DO 04/25/16 [phone]

## 2016-04-25 ENCOUNTER — Observation Stay (HOSPITAL_COMMUNITY): Payer: Medicaid Other

## 2016-04-25 ENCOUNTER — Encounter (HOSPITAL_COMMUNITY): Payer: Self-pay | Admitting: Emergency Medicine

## 2016-04-25 DIAGNOSIS — E1165 Type 2 diabetes mellitus with hyperglycemia: Secondary | ICD-10-CM

## 2016-04-25 DIAGNOSIS — G459 Transient cerebral ischemic attack, unspecified: Secondary | ICD-10-CM

## 2016-04-25 DIAGNOSIS — I63312 Cerebral infarction due to thrombosis of left middle cerebral artery: Secondary | ICD-10-CM | POA: Diagnosis not present

## 2016-04-25 DIAGNOSIS — I639 Cerebral infarction, unspecified: Secondary | ICD-10-CM | POA: Diagnosis not present

## 2016-04-25 DIAGNOSIS — B20 Human immunodeficiency virus [HIV] disease: Secondary | ICD-10-CM | POA: Diagnosis not present

## 2016-04-25 DIAGNOSIS — I25700 Atherosclerosis of coronary artery bypass graft(s), unspecified, with unstable angina pectoris: Secondary | ICD-10-CM | POA: Diagnosis not present

## 2016-04-25 DIAGNOSIS — I6300 Cerebral infarction due to thrombosis of unspecified precerebral artery: Secondary | ICD-10-CM

## 2016-04-25 DIAGNOSIS — N182 Chronic kidney disease, stage 2 (mild): Secondary | ICD-10-CM

## 2016-04-25 DIAGNOSIS — E039 Hypothyroidism, unspecified: Secondary | ICD-10-CM

## 2016-04-25 DIAGNOSIS — Z794 Long term (current) use of insulin: Secondary | ICD-10-CM

## 2016-04-25 LAB — CBG MONITORING, ED
Glucose-Capillary: 97 mg/dL (ref 65–99)
Glucose-Capillary: 204 mg/dL — ABNORMAL HIGH (ref 65–99)
Glucose-Capillary: 192 mg/dL — ABNORMAL HIGH (ref 65–99)

## 2016-04-25 LAB — DIFFERENTIAL
Basophils Absolute: 0 10*3/uL (ref 0.0–0.1)
Basophils Relative: 0 %
Eosinophils Absolute: 0.2 10*3/uL (ref 0.0–0.7)
Eosinophils Relative: 2 %
LYMPHS ABS: 3.8 10*3/uL (ref 0.7–4.0)
LYMPHS PCT: 58 %
MONO ABS: 0.6 10*3/uL (ref 0.1–1.0)
Monocytes Relative: 9 %
NEUTROS ABS: 2 10*3/uL (ref 1.7–7.7)
NEUTROS PCT: 31 %

## 2016-04-25 LAB — CBC
HCT: 33.2 % — ABNORMAL LOW (ref 39.0–52.0)
Hemoglobin: 11.9 g/dL — ABNORMAL LOW (ref 13.0–17.0)
MCH: 31.9 pg (ref 26.0–34.0)
MCHC: 35.8 g/dL (ref 30.0–36.0)
MCV: 89 fL (ref 78.0–100.0)
PLATELETS: 175 10*3/uL (ref 150–400)
RBC: 3.73 MIL/uL — AB (ref 4.22–5.81)
RDW: 12.3 % (ref 11.5–15.5)
WBC: 6.6 10*3/uL (ref 4.0–10.5)

## 2016-04-25 LAB — HEMOGLOBIN A1C
Hgb A1c MFr Bld: 10.3 % — ABNORMAL HIGH (ref 4.8–5.6)
MEAN PLASMA GLUCOSE: 249 mg/dL

## 2016-04-25 LAB — LIPID PANEL
CHOL/HDL RATIO: 3.3 ratio
Cholesterol: 134 mg/dL (ref 0–200)
HDL: 41 mg/dL (ref >40)
LDL CALC: 82 mg/dL (ref 0–99)
Triglycerides: 56 mg/dL (ref <150)
VLDL: 11 mg/dL (ref 0–40)

## 2016-04-25 LAB — GLUCOSE, CAPILLARY
GLUCOSE-CAPILLARY: 268 mg/dL — AB (ref 65–99)
Glucose-Capillary: 329 mg/dL — ABNORMAL HIGH (ref 65–99)

## 2016-04-25 LAB — COMPREHENSIVE METABOLIC PANEL
ALBUMIN: 3.9 g/dL (ref 3.5–5.0)
ALK PHOS: 76 U/L (ref 38–126)
ALT: 11 U/L — AB (ref 17–63)
AST: 19 U/L (ref 15–41)
Anion gap: 7 (ref 5–15)
BILIRUBIN TOTAL: 0.6 mg/dL (ref 0.3–1.2)
BUN: 19 mg/dL (ref 6–20)
CALCIUM: 9.1 mg/dL (ref 8.9–10.3)
CO2: 23 mmol/L (ref 22–32)
CREATININE: 1.33 mg/dL — AB (ref 0.61–1.24)
Chloride: 103 mmol/L (ref 101–111)
GFR calc Af Amer: >60 mL/min (ref >60)
GFR calc non Af Amer: 57 mL/min — ABNORMAL LOW (ref >60)
GLUCOSE: 95 mg/dL (ref 65–99)
Potassium: 3.7 mmol/L (ref 3.5–5.1)
SODIUM: 133 mmol/L — AB (ref 135–145)
Total Protein: 7.2 g/dL (ref 6.5–8.1)

## 2016-04-25 LAB — BRAIN NATRIURETIC PEPTIDE: B NATRIURETIC PEPTIDE 5: 503.3 pg/mL — AB (ref 0.0–100.0)

## 2016-04-25 LAB — I-STAT TROPONIN, ED: Troponin i, poc: 0.03 ng/mL (ref 0.00–0.08)

## 2016-04-25 LAB — PROTIME-INR
INR: 1.01
PROTHROMBIN TIME: 13.3 s (ref 11.4–15.2)

## 2016-04-25 LAB — I-STAT CHEM 8, ED
BUN: 24 mg/dL — AB (ref 6–20)
CHLORIDE: 100 mmol/L — AB (ref 101–111)
Calcium, Ion: 1.1 mmol/L — ABNORMAL LOW (ref 1.15–1.40)
Creatinine, Ser: 1.3 mg/dL — ABNORMAL HIGH (ref 0.61–1.24)
GLUCOSE: 91 mg/dL (ref 65–99)
HCT: 35 % — ABNORMAL LOW (ref 39.0–52.0)
Hemoglobin: 11.9 g/dL — ABNORMAL LOW (ref 13.0–17.0)
POTASSIUM: 3.8 mmol/L (ref 3.5–5.1)
Sodium: 136 mmol/L (ref 135–145)
TCO2: 26 mmol/L (ref 0–100)

## 2016-04-25 LAB — TSH: TSH: 0.205 u[IU]/mL — AB (ref 0.350–4.500)

## 2016-04-25 LAB — T4, FREE: FREE T4: 2.1 ng/dL — AB (ref 0.61–1.12)

## 2016-04-25 LAB — APTT: aPTT: 29 seconds (ref 24–36)

## 2016-04-25 MED ORDER — TENOFOVIR DISOPROXIL FUMARATE 300 MG PO TABS
300.0000 mg | ORAL_TABLET | Freq: Every day | ORAL | Status: DC
  Administered 2016-04-25: 300 mg via ORAL
  Filled 2016-04-25 (×2): qty 1

## 2016-04-25 MED ORDER — ASPIRIN 81 MG PO CHEW: 324.0000 mg | CHEWABLE_TABLET | Freq: Once | ORAL | Status: DC

## 2016-04-25 MED ORDER — EMTRICITABINE-TENOFOVIR AF 200-25 MG PO TABS
1.0000 | ORAL_TABLET | Freq: Every day | ORAL | Status: DC
  Administered 2016-04-26: 1 via ORAL
  Filled 2016-04-25: qty 1

## 2016-04-25 MED ORDER — ATORVASTATIN CALCIUM 10 MG PO TABS
20.0000 mg | ORAL_TABLET | Freq: Every day | ORAL | Status: DC
  Administered 2016-04-25: 20 mg via ORAL
  Filled 2016-04-25: qty 2

## 2016-04-25 MED ORDER — INSULIN ASPART 100 UNIT/ML ~~LOC~~ SOLN
0.0000 [IU] | Freq: Three times a day (TID) | SUBCUTANEOUS | Status: DC
  Administered 2016-04-25: 5 [IU] via SUBCUTANEOUS
  Administered 2016-04-25: 2 [IU] via SUBCUTANEOUS
  Administered 2016-04-26: 3 [IU] via SUBCUTANEOUS
  Filled 2016-04-25: qty 1

## 2016-04-25 MED ORDER — STROKE: EARLY STAGES OF RECOVERY BOOK
Freq: Once | Status: DC
  Filled 2016-04-25 (×2): qty 1

## 2016-04-25 MED ORDER — ASPIRIN EC 81 MG PO TBEC
81.0000 mg | DELAYED_RELEASE_TABLET | Freq: Every day | ORAL | Status: DC
  Administered 2016-04-25 – 2016-04-26 (×2): 81 mg via ORAL
  Filled 2016-04-25 (×3): qty 1

## 2016-04-25 MED ORDER — ACETAMINOPHEN 325 MG PO TABS: 650.0000 mg | ORAL_TABLET | Freq: Four times a day (QID) | ORAL | Status: DC | PRN

## 2016-04-25 MED ORDER — ENOXAPARIN SODIUM 40 MG/0.4ML ~~LOC~~ SOLN
40.0000 mg | Freq: Every day | SUBCUTANEOUS | Status: DC
  Administered 2016-04-25 – 2016-04-26 (×2): 40 mg via SUBCUTANEOUS
  Filled 2016-04-25 (×3): qty 0.4

## 2016-04-25 MED ORDER — NITROGLYCERIN 0.4 MG SL SUBL: 0.4000 mg | SUBLINGUAL_TABLET | SUBLINGUAL | Status: DC | PRN

## 2016-04-25 MED ORDER — CLOPIDOGREL BISULFATE 75 MG PO TABS
75.0000 mg | ORAL_TABLET | Freq: Every day | ORAL | Status: DC
  Administered 2016-04-25 – 2016-04-26 (×2): 75 mg via ORAL
  Filled 2016-04-25 (×3): qty 1

## 2016-04-25 MED ORDER — ISOSORBIDE MONONITRATE ER 30 MG PO TB24
30.0000 mg | ORAL_TABLET | Freq: Every day | ORAL | Status: DC
  Administered 2016-04-25 – 2016-04-26 (×2): 30 mg via ORAL
  Filled 2016-04-25 (×3): qty 1

## 2016-04-25 MED ORDER — METOPROLOL SUCCINATE ER 25 MG PO TB24
25.0000 mg | ORAL_TABLET | Freq: Every day | ORAL | Status: DC
  Administered 2016-04-25 – 2016-04-26 (×2): 25 mg via ORAL
  Filled 2016-04-25 (×3): qty 1

## 2016-04-25 MED ORDER — PANTOPRAZOLE SODIUM 40 MG PO TBEC
40.0000 mg | DELAYED_RELEASE_TABLET | Freq: Every day | ORAL | Status: DC
  Administered 2016-04-25 – 2016-04-26 (×2): 40 mg via ORAL
  Filled 2016-04-25 (×3): qty 1

## 2016-04-25 MED ORDER — LEVOTHYROXINE SODIUM 100 MCG PO TABS
300.0000 ug | ORAL_TABLET | Freq: Every day | ORAL | Status: DC
  Administered 2016-04-25 – 2016-04-26 (×2): 300 ug via ORAL
  Filled 2016-04-25 (×3): qty 3

## 2016-04-25 MED ORDER — DARUNAVIR ETHANOLATE 800 MG PO TABS
800.0000 mg | ORAL_TABLET | Freq: Every day | ORAL | Status: DC
  Administered 2016-04-25 – 2016-04-26 (×2): 800 mg via ORAL
  Filled 2016-04-25 (×3): qty 1

## 2016-04-25 MED ORDER — EMTRICITABINE 200 MG PO CAPS
200.0000 mg | ORAL_CAPSULE | Freq: Every day | ORAL | Status: DC
  Filled 2016-04-25 (×2): qty 1

## 2016-04-25 MED ORDER — ZOLPIDEM TARTRATE 5 MG PO TABS: 5.0000 mg | ORAL_TABLET | Freq: Every evening | ORAL | Status: DC | PRN

## 2016-04-25 MED ORDER — ATORVASTATIN CALCIUM 10 MG PO TABS
20.0000 mg | ORAL_TABLET | Freq: Every day | ORAL | Status: DC
  Administered 2016-04-25: 20 mg via ORAL
  Filled 2016-04-25 (×2): qty 2

## 2016-04-25 MED ORDER — RITONAVIR 100 MG PO CAPS
100.0000 mg | ORAL_CAPSULE | Freq: Every day | ORAL | Status: DC
  Administered 2016-04-25 – 2016-04-26 (×2): 100 mg via ORAL
  Filled 2016-04-25 (×3): qty 1

## 2016-04-25 MED ORDER — CITALOPRAM HYDROBROMIDE 10 MG PO TABS
10.0000 mg | ORAL_TABLET | Freq: Every day | ORAL | Status: DC
  Administered 2016-04-25 – 2016-04-26 (×2): 10 mg via ORAL
  Filled 2016-04-25 (×3): qty 1

## 2016-04-25 MED ORDER — SENNOSIDES-DOCUSATE SODIUM 8.6-50 MG PO TABS
1.0000 | ORAL_TABLET | Freq: Every evening | ORAL | Status: DC | PRN
  Filled 2016-04-25: qty 1

## 2016-04-25 MED ORDER — ATORVASTATIN CALCIUM 40 MG PO TABS: 40.0000 mg | ORAL_TABLET | Freq: Every day | ORAL | Status: DC

## 2016-04-25 MED ORDER — INSULIN GLARGINE 100 UNIT/ML ~~LOC~~ SOLN
10.0000 [IU] | Freq: Every day | SUBCUTANEOUS | Status: DC
  Administered 2016-04-25: 10 [IU] via SUBCUTANEOUS
  Filled 2016-04-25 (×2): qty 0.1

## 2016-04-25 NOTE — ED Notes (Signed)
Patient was given a coke. 

## 2016-04-25 NOTE — ED Notes (Signed)
Pt came in today as a code stroke with c/o right facial numbness, right sided facial droop and slurred speech. Upon arrival pt had a NIH of 0. Pt passed his stroke swallow screen and is a&o x4. Pt is very pleasant and has family at the bedside. Pt has a 20 gauge IV in her left forearm that is working well. Pt is able to walk with no assistance. Does have a hx of CVA, MRI was negative for stroke but shows Wernicke Encephalopathy.

## 2016-04-25 NOTE — ED Notes (Signed)
CBG 204. 

## 2016-04-25 NOTE — Progress Notes (Signed)
Patient admitted from ER. Patient oriented to the unit. Family is visiting. Bed alarm was set. Will continue to monitor.

## 2016-04-25 NOTE — Progress Notes (Signed)
PT Cancellation Note  Patient Details Name: Jackson Shaw MRN: [id number] DOB: [birth year]   Cancelled Treatment:    Reason Eval/Treat Not Completed: Other (comment). Pt currently with bed rest orders in place. Please update activity level if and when pt is medically ready for PT evaluation. PT will continue to f/u with pt as appropriate.   [name] 04/25/2016, 4:20 PM Sherie Don, [place], DPT [phone]

## 2016-04-25 NOTE — Consult Note (Signed)
Admission H&P    Chief Complaint: Facial weakness and numbness, and slurred speech.  HPI: Jackson Shaw is an 60 y.o. male with a history of stroke, coronary artery disease, hypertension, hyperlipidemia, diabetes mellitus and HIV infection, presenting following acute onset of tingling and weakness involving right side of his face as well as slurred speech. He also noticed slight tingling in both hands. Facial droop subsequently resolved, as did tingling. Slurred speech improved, but did not completely resolve. He's been taking aspirin and Plavix daily. CT scan of his head showed no acute intracranial abnormality. NIH stroke score at the time of this evaluation was 1 for persistent slurred speech.  LSN: 11:15 PM on 04/24/2016 tPA Given: No: Deficits rapidly resolving mRankin:  Past Medical History:  Diagnosis Date  . Anginal pain ([place])   . Anxiety   . Aspiration pneumonia ([place]) 06/27/2015  . Chronic systolic CHF (congestive heart failure) ([place])    a. 12/2013 Echo: EF 45%;  b. 03/2015 Echo: EF 35%, mild MR, mildly dil LA.  Marland Kitchen Coronary artery disease    a. s/p prior CABG;  b. 03/2015 NSTEMI/PCI: 100ost CTO, LAD 100ost/p, 45d, D1 100/95ost, RI 75, LCX 100p, OM1/3 small, RCA 100ost->distal, RPDA/RPAV small,RPA1 80, RPA2 90, RPLB 80, RPLB2 80,  VG->D1->RI 10p, patent stents, VG->RPDA->RPLB3 99 before RPDA (3.5x24 Promus DES), 100 between PDA& RPLB3 (CTO), LIMA->LAD nl.  . Diabetic neuropathy ([place]) 12/22/2014  . GERD (gastroesophageal reflux disease)   . History of duodenal ulcer   . History of hiatal hernia   . HIV (human immunodeficiency virus infection) ([place]) a. Dx in 1980's.  Marland Kitchen Hyperlipidemia   . Hypertension   . Hypothyroidism   . 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 Kidney stones   . MI (myocardial infarction)    "I've had 4" (06/27/2015)  . NSTEMI (non-ST elevated myocardial infarction) ([place]) 03/2015  . Pneumonia "several times"  . Stroke [facility])    a. Recurrent L ischemic  midbrain stroke - last 03/2015.  Marland Kitchen Syncope and collapse    a. 12/2013 s/p MDT Linq ILR.  Marland Kitchen Type II diabetes mellitus ([place])     Past Surgical History:  Procedure Laterality Date  . CARDIAC CATHETERIZATION N/A 04/19/2015   Procedure: Left Heart Cath and Coronary Angiography;  Surgeon: Leonie Man, MD;  Location: [place] [facility];  Service: Cardiovascular;  Laterality: N/A;  . CARDIAC CATHETERIZATION N/A 04/19/2015   Procedure: Coronary Stent Intervention;  Surgeon: Leonie Man, MD;  Location: [place] [facility];  Service: Cardiovascular;  Laterality: N/A;  . CORONARY ANGIOPLASTY WITH STENT PLACEMENT  "3-4 times"   "I've got several stents"  . CORONARY ARTERY BYPASS GRAFT  1980s   "CABG X5; Dr. Lia Foyer"  . CYSTOSCOPY W/ STONE MANIPULATION  1980s  . LOOP RECORDER IMPLANT     [employer] Reveal Model # [id number] MR safe    Family History  Problem Relation Age of Onset  . Heart disease Mother   . Diabetes Mother   . Cancer Mother   . Heart attack Father   . Stroke Sister     ICH  . Diabetes Sister    Social History:  reports that he has never smoked. He has never used smokeless tobacco. He reports that he does not drink alcohol or use drugs.  Allergies:  Allergies  Allergen Reactions  . Penicillins Anaphylaxis  . Codeine Nausea Only    Medications: Preadmission medications were reviewed by me.  ROS: History obtained from  the patient  General ROS: negative for - chills, fatigue, fever, night sweats, weight gain or weight loss Psychological ROS: negative for - behavioral disorder, hallucinations, memory difficulties, mood swings or suicidal ideation Ophthalmic ROS: negative for - blurry vision, double vision, eye pain or loss of vision ENT ROS: negative for - epistaxis, nasal discharge, oral lesions, sore throat, tinnitus or vertigo Allergy and Immunology ROS: negative for - hives or itchy/watery eyes Hematological and Lymphatic ROS: negative for - bleeding problems,  bruising or swollen lymph nodes Endocrine ROS: negative for - galactorrhea, hair pattern changes, polydipsia/polyuria or temperature intolerance Respiratory ROS: negative for - cough, hemoptysis, shortness of breath or wheezing Cardiovascular ROS: negative for - chest pain, dyspnea on exertion, edema or irregular heartbeat Gastrointestinal ROS: negative for - abdominal pain, diarrhea, hematemesis, nausea/vomiting or stool incontinence Genito-Urinary ROS: negative for - dysuria, hematuria, incontinence or urinary frequency/urgency Musculoskeletal ROS: negative for - joint swelling or muscular weakness Neurological ROS: as noted in HPI Dermatological ROS: negative for rash and skin lesion changes  Physical Examination: Blood pressure 149/96, pulse 64, temperature 97.9 F (36.6 C), temperature source Oral, resp. rate 18, height 5\' 8"  (1.727 m), weight 74.6 kg (164 lb 7.4 oz), SpO2 98 %.  HEENT-  Normocephalic, no lesions, without obvious abnormality.  Normal external eye and conjunctiva.  Normal TM's bilaterally.  Normal auditory canals and external ears. Normal external nose, mucus membranes and septum.  Normal pharynx. Neck supple with no masses, nodes, nodules or enlargement. Cardiovascular - regular rate and rhythm, S1, S2 normal, no murmur, click, rub or gallop Lungs - chest clear, no wheezing, rales, normal symmetric air entry Abdomen - soft, non-tender; bowel sounds normal; no masses,  no organomegaly Extremities - no joint deformities, effusion, or inflammation and no edema  Neurologic Examination: Mental Status: Alert, oriented, thought content appropriate.  Speech slightly slurred without evidence of aphasia. Able to follow commands without difficulty. Cranial Nerves: II-Visual fields were normal. III/IV/VI-Pupils were equal and reacted normally to light. Extraocular movements were full and conjugate.    V/VII-no facial numbness and no facial weakness. VIII-normal. X-normal  speech. XI: trapezius strength/neck flexion strength normal bilaterally XII-midline tongue extension with normal strength. Motor: 5/5 bilaterally with normal tone and bulk Sensory: Normal throughout. Deep Tendon Reflexes: 1+ and symmetric. Plantars: Flexor bilaterally Cerebellar: Normal finger-to-nose testing. Carotid auscultation: Normal  Results for orders placed or performed during the hospital encounter of 04/24/16 (from the past 48 hour(s))  CBC     Status: Abnormal   Collection Time: 04/24/16 11:49 PM  Result Value Ref Range   WBC 6.6 4.0 - 10.5 K/uL   RBC 3.73 (L) 4.22 - 5.81 MIL/uL   Hemoglobin 11.9 (L) 13.0 - 17.0 g/dL   HCT 33.2 (L) 39.0 - 52.0 %   MCV 89.0 78.0 - 100.0 fL   MCH 31.9 26.0 - 34.0 pg   MCHC 35.8 30.0 - 36.0 g/dL   RDW 12.3 11.5 - 15.5 %   Platelets 175 150 - 400 K/uL  Differential     Status: None   Collection Time: 04/24/16 11:49 PM  Result Value Ref Range   Neutrophils Relative % 31 %   Neutro Abs 2.0 1.7 - 7.7 K/uL   Lymphocytes Relative 58 %   Lymphs Abs 3.8 0.7 - 4.0 K/uL   Monocytes Relative 9 %   Monocytes Absolute 0.6 0.1 - 1.0 K/uL   Eosinophils Relative 2 %   Eosinophils Absolute 0.2 0.0 - 0.7 K/uL   Basophils  Relative 0 %   Basophils Absolute 0.0 0.0 - 0.1 K/uL  I-stat troponin, ED     Status: None   Collection Time: 04/25/16 12:12 AM  Result Value Ref Range   Troponin i, poc 0.03 0.00 - 0.08 ng/mL   Comment 3            Comment: Due to the release kinetics of cTnI, a negative result within the first hours of the onset of symptoms does not rule out myocardial infarction with certainty. If myocardial infarction is still suspected, repeat the test at appropriate intervals.   I-Stat Chem 8, ED     Status: Abnormal   Collection Time: 04/25/16 12:14 AM  Result Value Ref Range   Sodium 136 135 - 145 mmol/L   Potassium 3.8 3.5 - 5.1 mmol/L   Chloride 100 (L) 101 - 111 mmol/L   BUN 24 (H) 6 - 20 mg/dL   Creatinine, Ser 1.30 (H) 0.61 -  1.24 mg/dL   Glucose, Bld 91 65 - 99 mg/dL   Calcium, Ion 1.10 (L) 1.15 - 1.40 mmol/L   TCO2 26 0 - 100 mmol/L   Hemoglobin 11.9 (L) 13.0 - 17.0 g/dL   HCT 35.0 (L) 39.0 - 52.0 %   Ct Head Code Stroke W/o Cm  Result Date: 04/25/2016 CLINICAL DATA:  Code stroke.  Right-sided facial numbness EXAM: CT HEAD WITHOUT CONTRAST TECHNIQUE: Contiguous axial images were obtained from the base of the skull through the vertex without intravenous contrast. COMPARISON:  Head CT 08/29/2015 FINDINGS: Brain: No mass lesion, intraparenchymal hemorrhage or extra-axial collection. No evidence of acute cortical infarct. There is age advanced atrophy. Insert hypo Vascular: No hyperdense vessel or unexpected calcification. Skull: Normal visualized skull base, calvarium and extracranial soft tissues. Sinuses/Orbits: No sinus fluid levels or advanced mucosal thickening. No mastoid effusion. Normal orbits. ASPECTS [facility] Stroke Program Early CT Score) - Ganglionic level infarction (caudate, lentiform nuclei, internal capsule, insula, M1-M3 cortex): 7 - Supraganglionic infarction (M4-M6 cortex): 3 Total score (0-10 with 10 being normal): 10 IMPRESSION: 1. No acute intracranial abnormality. Atrophy and chronic microvascular ischemia much greater than expected for age. 2. ASPECTS is 10. These results were called by telephone at the time of interpretation on 04/25/2016 at 12:07 am to Dr. Wallie Char, who verbally acknowledged these results. Electronically Signed   By: Ulyses Jarred M.D.   On: 04/25/2016 00:08    Assessment: 60 y.o. male with multiple risk factors for stroke as well as history of stroke presenting with probable transient ischemic attack. However, an acute subcortical left cerebral infarction cannot be ruled out at this point.  Stroke Risk Factors - diabetes mellitus, family history, hyperlipidemia and hypertension  Plan: 1. HgbA1c, fasting lipid panel 2. MRI, MRA  of the brain without contrast 3. PT  consult, OT consult, Speech consult 4. Echocardiogram 5. Carotid dopplers 6. Prophylactic therapy-Antiplatelet med: Aspirin and Plavix 7. Risk factor modification 8. Telemetry monitoring  C.R. Nicole Kindred, MD [facility] [phone]  04/25/2016, 12:36 AM

## 2016-04-25 NOTE — H&P (Signed)
History and Physical    Jackson Shaw [id number] DOB: Feb 10, 1956 DOA: 04/24/2016  Referring MD/NP/PA:   PCP: Arnoldo Morale, MD   Patient coming from:  The patient is coming from home.  At baseline, pt is independent for most of ADL.   Chief Complaint: Slurred speech, right facial droop and numbness,   HPI: Jackson Shaw is a 60 y.o. male with medical history significant of stroke, hypertension, hyperlipidemia, diabetes mellitus, GERD, hypothyroidism, CAD, s/p of Age and a stent placement, Kaposi's sarcoma, HIV disease CD4 410 on 06/27/15), duodenal ulcer, sCHF, CKD-II, s/p of loop recorder implant (Medtronic Reveal Model # [id number] MR safe), who presents with a slurred speech, right facial droop and numbness.  Patient reports that he start having slurred speech, right facial droop and numbness at about 11 PM. He also noticed slight tingling in both hands. Facial droop and numbness subsequently resolved. Slurred speech improved, but did not completely resolve. Patient denies unilateral weakness, vision change or hearing loss. Patient does not have chest pain, shortness of breath, cough, fever, chills, nausea, vomiting, abdominal pain, diarrhea, symptoms of UTI.  ED Course: pt was found to have WBC 6.6, INR 1.09, PTT 29, negative troponin, stable renal function, temperature normal, O2 saturation 98% on room air, negative CT head for acute intracranial abnormalities. Patient is placed on telemetry bed for observation. Neurology, Dr. Nicole Kindred was consulted.  Review of Systems:   General: no fevers, chills, no changes in body weight, has fatigue HEENT: no blurry vision, hearing changes or sore throat Respiratory: no dyspnea, coughing, wheezing CV: no chest pain, no palpitations GI: no nausea, vomiting, abdominal pain, diarrhea, constipation GU: no dysuria, burning on urination, increased urinary frequency, hematuria  Ext: no leg edema Neuro: has slurred speech, right facial drooping and numbness,  hand numbness, no vision change or hearing loss Skin: no rash, no skin tear. MSK: No muscle spasm, no deformity, no limitation of range of movement in spin Heme: No easy bruising.  Travel history: No recent long distant travel.  Allergy:  Allergies  Allergen Reactions  . Penicillins Anaphylaxis  . Codeine Nausea Only    Past Medical History:  Diagnosis Date  . Anginal pain ([place])   . Anxiety   . Aspiration pneumonia ([place]) 06/27/2015  . Chronic systolic CHF (congestive heart failure) ([place])    a. 12/2013 Echo: EF 45%;  b. 03/2015 Echo: EF 35%, mild MR, mildly dil LA.  Marland Kitchen Coronary artery disease    a. s/p prior CABG;  b. 03/2015 NSTEMI/PCI: 100ost CTO, LAD 100ost/p, 45d, D1 100/95ost, RI 75, LCX 100p, OM1/3 small, RCA 100ost->distal, RPDA/RPAV small,RPA1 80, RPA2 90, RPLB 80, RPLB2 80,  VG->D1->RI 10p, patent stents, VG->RPDA->RPLB3 99 before RPDA (3.5x24 Promus DES), 100 between PDA& RPLB3 (CTO), LIMA->LAD nl.  . Diabetic neuropathy ([place]) 12/22/2014  . GERD (gastroesophageal reflux disease)   . History of duodenal ulcer   . History of hiatal hernia   . HIV (human immunodeficiency virus infection) ([place]) a. Dx in 1980's.  Marland Kitchen Hyperlipidemia   . Hypertension   . Hypothyroidism   . 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 Kidney stones   . MI (myocardial infarction)    "I've had 4" (06/27/2015)  . NSTEMI (non-ST elevated myocardial infarction) ([place]) 03/2015  . Pneumonia "several times"  . Stroke [facility])    a. 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  Marland Kitchen Syncope and collapse    a. 12/2013 s/p MDT Linq ILR.  Marland Kitchen Type  II diabetes mellitus ([place])     Past Surgical History:  Procedure Laterality Date  . CARDIAC CATHETERIZATION N/A 04/19/2015   Procedure: Left Heart Cath and Coronary Angiography;  Surgeon: Leonie Man, MD;  Location: [place] [facility];  Service: Cardiovascular;  Laterality: N/A;  . CARDIAC CATHETERIZATION N/A 04/19/2015   Procedure: Coronary Stent  Intervention;  Surgeon: Leonie Man, MD;  Location: [place] [facility];  Service: Cardiovascular;  Laterality: N/A;  . CORONARY ANGIOPLASTY WITH STENT PLACEMENT  "3-4 times"   "I've got several stents"  . CORONARY ARTERY BYPASS GRAFT  1980s   "CABG X5; Dr. Lia Foyer"  . CYSTOSCOPY W/ STONE MANIPULATION  1980s  . LOOP RECORDER IMPLANT     Medtronic Reveal Model # [id number] MR safe    Social History:  reports that he has never smoked. He has never used smokeless tobacco. He reports that he does not drink alcohol or use drugs.  Family History:  Family History  Problem Relation Age of Onset  . Heart disease Mother   . Diabetes Mother   . Cancer Mother   . Heart attack Father   . Stroke Sister     ICH  . Diabetes Sister      Prior to Admission medications   Medication Sig Start Date End Date Taking? Authorizing Provider  aspirin EC 81 MG tablet Take 81 mg by mouth daily.  08/12/12  Yes Historical Provider, MD  atorvastatin (LIPITOR) 20 MG tablet Take 1 tablet (20 mg total) by mouth daily. 07/06/15  Yes Brittainy Erie Noe, PA-C  citalopram (CELEXA) 10 MG tablet Take 10 mg by mouth daily.    Yes Historical Provider, MD  clopidogrel (PLAVIX) 75 MG tablet Take 1 tablet (75 mg total) by mouth daily. 07/06/15  Yes Brittainy Erie Noe, PA-C  Darunavir Ethanolate (PREZISTA) 800 MG tablet Take 800 mg by mouth daily.    Yes Historical Provider, MD  emtricitabine-tenofovir (TRUVADA) 200-300 MG per tablet Take 1 tablet by mouth daily.   Yes Historical Provider, MD  insulin aspart (NOVOLOG) 100 UNIT/ML injection Inject 6 Units into the skin 3 (three) times daily with meals. 6 units is the base. Add 2 units for each 15gm of carbs   Yes Historical Provider, MD  Insulin Glargine (LANTUS SOLOSTAR) 100 UNIT/ML Solostar Pen Inject 20 Units into the skin daily at 10 pm. 07/17/15  Yes Arnoldo Morale, MD  isosorbide mononitrate (IMDUR) 30 MG 24 hr tablet Take 1 tablet (30 mg total) by mouth daily. 07/06/15  Yes  Brittainy Erie Noe, PA-C  levothyroxine (SYNTHROID, LEVOTHROID) 75 MCG tablet Take 4 tablets (300 mcg total) by mouth daily before breakfast. Must have office visit for refills 11/06/15  Yes Arnoldo Morale, MD  metoprolol succinate (TOPROL XL) 25 MG 24 hr tablet Take 1 tablet (25 mg total) by mouth daily. 07/06/15  Yes Brittainy Erie Noe, PA-C  nitroGLYCERIN (NITROSTAT) 0.4 MG SL tablet Place 1 tablet (0.4 mg total) under the tongue every 5 (five) minutes as needed for chest pain. 04/20/15  Yes Rogelia Mire, NP  pantoprazole (PROTONIX) 40 MG tablet Take 40 mg by mouth daily.    Yes Historical Provider, MD  ritonavir (NORVIR) 100 MG capsule Take 100 mg by mouth daily.    Yes Historical Provider, MD  albuterol (PROVENTIL HFA;VENTOLIN HFA) 108 (90 Base) MCG/ACT inhaler Inhale 2 puffs into the lungs every 6 (six) hours as needed for wheezing or shortness of breath. Patient not taking: Reported on 04/25/2016 01/23/16  Shawn C Joy, PA-C  benzonatate (TESSALON) 100 MG capsule Take 1 capsule (100 mg total) by mouth every 8 (eight) hours. Patient not taking: Reported on 04/25/2016 01/23/16   Shawn C Joy, PA-C  doxycycline (VIBRAMYCIN) 100 MG capsule Take 1 capsule (100 mg total) by mouth 2 (two) times daily. Patient not taking: Reported on 04/25/2016 01/23/16   Helane Gunther Joy, PA-C  feeding supplement, ENSURE ENLIVE, (ENSURE ENLIVE) LIQD Take 237 mLs by mouth 2 (two) times daily between meals. Patient not taking: Reported on 04/25/2016 06/29/15   Belkys A Regalado, MD  gabapentin (NEURONTIN) 300 MG capsule One capsule at night for 2 weeks, then take one capsule twice a day Patient not taking: Reported on 04/25/2016 12/22/14   Kathrynn Ducking, MD  Insulin Pen Needle 31G X 5 MM MISC 1 each by Does not apply route at bedtime. 07/03/15   Arnoldo Morale, MD    Physical Exam: Vitals:   04/25/16 0010 04/25/16 0013  BP:  149/96  Pulse:  64  Resp:  18  Temp:  97.9 F (36.6 C)  TempSrc:  Oral  SpO2:  98%  Weight:  74.6 kg (164 lb 7.4 oz)   Height: 5\' 8"  (1.727 m)    General: Not in acute distress HEENT:       Eyes: PERRL, EOMI, no scleral icterus.       ENT: No discharge from the ears and nose, no pharynx injection, no tonsillar enlargement.        Neck: No JVD, no bruit, no mass felt. Heme: No neck lymph node enlargement. Cardiac: S1/S2, RRR, No murmurs, No gallops or rubs. Respiratory: No rales, wheezing, rhonchi or rubs. GI: Soft, nondistended, nontender, no rebound pain, no organomegaly, BS present. GU: No hematuria Ext: No pitting leg edema bilaterally. 2+DP/PT pulse bilaterally. Musculoskeletal: No joint deformities, No joint redness or warmth, no limitation of ROM in spin. Skin: No rashes.  Neuro: Alert, oriented X3, cranial nerves II-XII grossly intact, moves all extremities normally. Muscle strength 5/5 in all extremities, sensation to light touch intact. Brachial reflex 2+ bilaterally. Negative Babinski's sign. Psych: Patient is not psychotic, no suicidal or hemocidal ideation.  Labs on Admission: I have personally reviewed following labs and imaging studies  CBC:  Recent Labs Lab 04/24/16 2349 04/25/16 0014  WBC 6.6  --   NEUTROABS 2.0  --   HGB 11.9* 11.9*  HCT 33.2* 35.0*  MCV 89.0  --   PLT 175  --    Basic Metabolic Panel:  Recent Labs Lab 04/24/16 2349 04/25/16 0014  NA 133* 136  K 3.7 3.8  CL 103 100*  CO2 23  --   GLUCOSE 95 91  BUN 19 24*  CREATININE 1.33* 1.30*  CALCIUM 9.1  --    GFR: Estimated Creatinine Clearance: 59.2 mL/min (by C-G formula based on SCr of 1.3 mg/dL (H)). Liver Function Tests:  Recent Labs Lab 04/24/16 2349  AST 19  ALT 11*  ALKPHOS 76  BILITOT 0.6  PROT 7.2  ALBUMIN 3.9   No results for input(s): LIPASE, AMYLASE in the last 168 hours. No results for input(s): AMMONIA in the last 168 hours. Coagulation Profile:  Recent Labs Lab 04/24/16 2349  INR 1.01   Cardiac Enzymes: No results for input(s): CKTOTAL, CKMB,  CKMBINDEX, TROPONINI in the last 168 hours. BNP (last 3 results) No results for input(s): PROBNP in the last 8760 hours. HbA1C: No results for input(s): HGBA1C in the last 72 hours. CBG: No results  for input(s): GLUCAP in the last 168 hours. Lipid Profile: No results for input(s): CHOL, HDL, LDLCALC, TRIG, CHOLHDL, LDLDIRECT in the last 72 hours. Thyroid Function Tests: No results for input(s): TSH, T4TOTAL, FREET4, T3FREE, THYROIDAB in the last 72 hours. Anemia Panel: No results for input(s): VITAMINB12, FOLATE, FERRITIN, TIBC, IRON, RETICCTPCT in the last 72 hours. Urine analysis:    Component Value Date/Time   COLORURINE YELLOW 01/23/2016 0558   APPEARANCEUR CLEAR 01/23/2016 0558   LABSPEC 1.032 (H) 01/23/2016 0558   PHURINE 6.0 01/23/2016 0558   GLUCOSEU >1000 (A) 01/23/2016 0558   HGBUR SMALL (A) 01/23/2016 0558   BILIRUBINUR NEGATIVE 01/23/2016 0558   KETONESUR NEGATIVE 01/23/2016 0558   PROTEINUR 100 (A) 01/23/2016 0558   UROBILINOGEN 0.2 09/19/2014 0325   NITRITE NEGATIVE 01/23/2016 0558   LEUKOCYTESUR NEGATIVE 01/23/2016 0558   Sepsis Labs: @LABRCNTIP (procalcitonin:4,lacticidven:4) )No results found for this or any previous visit (from the past 240 hour(s)).   Radiological Exams on Admission: Ct Head Code Stroke W/o Cm  Result Date: 04/25/2016 CLINICAL DATA:  Code stroke.  Right-sided facial numbness EXAM: CT HEAD WITHOUT CONTRAST TECHNIQUE: Contiguous axial images were obtained from the base of the skull through the vertex without intravenous contrast. COMPARISON:  Head CT 08/29/2015 FINDINGS: Brain: No mass lesion, intraparenchymal hemorrhage or extra-axial collection. No evidence of acute cortical infarct. There is age advanced atrophy. Insert hypo Vascular: No hyperdense vessel or unexpected calcification. Skull: Normal visualized skull base, calvarium and extracranial soft tissues. Sinuses/Orbits: No sinus fluid levels or advanced mucosal thickening. No mastoid  effusion. Normal orbits. ASPECTS [facility] Stroke Program Early CT Score) - Ganglionic level infarction (caudate, lentiform nuclei, internal capsule, insula, M1-M3 cortex): 7 - Supraganglionic infarction (M4-M6 cortex): 3 Total score (0-10 with 10 being normal): 10 IMPRESSION: 1. No acute intracranial abnormality. Atrophy and chronic microvascular ischemia much greater than expected for age. 2. ASPECTS is 10. These results were called by telephone at the time of interpretation on 04/25/2016 at 12:07 am to Dr. Wallie Char, who verbally acknowledged these results. Electronically Signed   By: Ulyses Jarred M.D.   On: 04/25/2016 00:08     EKG: Independently reviewed.  Sinus rhythm, QTC 478, early R-wave progression, T-wave inversion in lead 4-V6 and in lateral leads.   Assessment/Plan Principal Problem:   Stroke (cerebrum) (HCC) Active Problems:   Human immunodeficiency virus (HIV) disease (HCC)   Hypothyroidism   Insulin dependent type 2 diabetes mellitus, uncontrolled (HCC)   HYPERCHOLESTEROLEMIA   S/P CABG x 5   GASTROESOPHAGEAL REFLUX DISEASE   RENAL DISEASE, CHRONIC, STAGE II   Status post placement of implantable loop recorder   Coronary artery disease involving coronary bypass graft of native heart with unstable angina pectoris ([place])   Hypertension   Stroke: Patient has history of stroke, currently taking Plavix and aspirin. His symptoms today are concerning for new stroke. Neurology was consulted, Dr. Nicole Kindred recommended to do stroke workup.  -will place on tele bed for obs -highly appreciate Dr. Les Pou consultation, with follow-up recommendations as follows: 1. HgbA1c, fasting lipid panel 2. MRI, MRA  of the brain without contrast 3. PT consult, OT consult, Speech consult 4. Echocardiogram 5. Carotid dopplers 6. Prophylactic therapy-Antiplatelet med: Aspirin and Plavix 7. Risk factor modification 8. Telemetry monitoring  HIV: CD4 410 and VL 950 on 06/27/15. -continue  home meds: Prezista, Truvada, Norvir  Hypothyroidism: Last TSH was 6.62 on 07/03/15 -Continue home Synthroid -Check TSH  HLD: Last LDL was not our record -Continue home medications:  Lipitor -Check FLP  CAD: s/p of CABG and stent. No CP. -continue aspirin, Lipitor, imdur, metoprolol -When necessary nitroglycerin  DM-II: Last A1c 11.8 on 06/27/15, poorly controled. Patient is taking NovoLog and Lantus at home -will decrease Lantus dose from 20-10 units daily  -SSI  GERD: -Protonix  CKD-II: stable. Baseline creatinine 1.3-1.4, his creatinine is 1.30 on admission. -Follow-up renal function by BMP  Hypertension: -continue metoprolol  Chronic systolic congestive heart failure: 2-D echo 04/19/15 showed EF 25%. Patient not taking diuretics at home. No leg edema or JVD. CHF is compensated. -Continue aspirin and metoprolol -Check BNP  Depression: Stable, no suicidal or homicidal ideations. -Continue home medications: Celexa    DVT ppx: SQ Lovenox Code Status: Full code Family Communication:  Yes, patient's partner at bed side Disposition Plan:  Anticipate discharge back to previous home environment Consults called:  Neurology, Dr. Nicole Kindred Admission status: Obs / tele     Date of Service 04/25/2016    Ivor Costa [facility] Pager [phone]  If 7PM-7AM, please contact night-coverage [email or web address] Password [email or web address] 04/25/2016, 1:21 AM

## 2016-04-25 NOTE — Progress Notes (Signed)
Patient seen and examined  60 y.o. male with medical history significant of stroke, hypertension, hyperlipidemia, diabetes mellitus, GERD, hypothyroidism, CAD, s/p of Age and a stent placement, Kaposi's sarcoma, HIV disease CD4 410 on 06/27/15), duodenal ulcer, sCHF, CKD-II, s/p of loop recorder implant (Medtronic Reveal Model # [id number] MR safe), who presents with a slurred speech, right facial droop and numbness. No acute CVA   Assessment and plan  TIA 1. HgbA1c, fasting lipid panel-LDL 82 2. MRI, MRA  of the brain without contrast-within normal limits, except Wernicke's encephalopathy and advanced atrophy with multiple old lacunar infarcts 3. PT consult, OT consult, Speech consult-pending 4. Echocardiogram-pending 5. Carotid dopplers-pending 6. Prophylactic therapy-Antiplatelet med: Aspirin and Plavix 7. Risk factor modification 8. Telemetry monitoring  HIV: CD4 410 and VL 950 on 06/27/15. -continue home meds: Prezista, Truvada, Norvir  Hypothyroidism: Last TSH was 6.62 on 07/03/15 -Continue home Synthroid TSH 0.2, check free T4   HLD: Last LDL was not our record -Continue home medications: Lipitor LDL 82  CAD: s/p of CABG and stent. No CP. Cardiac cath 03/19/15 with PCI -continue aspirin/Plavix, Lipitor, imdur, metoprolol -When necessary nitroglycerin    DM-II: Last A1c 11.8 on 06/27/15, poorly controled. Patient is taking NovoLog and Lantus at home -will decrease Lantus dose from 20-10 units daily  -SSI  GERD: -Protonix  CKD-II: stable. Baseline creatinine 1.3-1.4, his creatinine is 1.30 on admission. -Follow-up renal function by BMP  Hypertension: -continue metoprolol  Chronic systolic congestive heart failure: 2-D echo 04/19/15 showed EF 25%. Patient not taking diuretics at home. No leg edema or JVD. CHF is compensated. -Continue aspirin and metoprolol BNP 500  Depression: Stable, no suicidal or homicidal ideations. -Continue home medications:  Celexa

## 2016-04-25 NOTE — ED Triage Notes (Signed)
Patient arrived via POV with a family member. LKW [id number] with slurred speech, right side facial droop, and right side facial numbness. Alert answering and following commands appropriate. Doctor and EDP notified.

## 2016-04-25 NOTE — ED Notes (Addendum)
MRI called to come for patient

## 2016-04-25 NOTE — Progress Notes (Signed)
Mr. Collen Jenerette came into triage with Right side facial tingling, numbness, facial droop associated with slurred speech. LKW [id number]. Facial droop and tingling resolved, slurred speech somewhat resolved. NIHSS 1, CBG 9. Pt to be admitted to triad, further evaluation for possible TIA

## 2016-04-25 NOTE — ED Notes (Signed)
Patient borrowed a Pensions consultant to charge phone.

## 2016-04-25 NOTE — Progress Notes (Signed)
STROKE TEAM PROGRESS NOTE   HISTORY OF PRESENT ILLNESS (per record) Jackson Shaw is an 60 y.o. male with a history of stroke, coronary artery disease, hypertension, hyperlipidemia, diabetes mellitus and HIV infection, presenting following acute onset of tingling and weakness involving right side of his face as well as slurred speech. He also noticed slight tingling in both hands. Facial droop subsequently resolved, as did tingling. Slurred speech improved, but did not completely resolve. He's been taking aspirin and Plavix daily. CT scan of his head showed no acute intracranial abnormality. NIH stroke score at the time of this evaluation was 1 for persistent slurred speech.  LSN: 11:15 PM on 04/24/2016 tPA Given: No: Deficits rapidly resolving mRankin:    SUBJECTIVE (INTERVAL HISTORY) His  Wife and son are   at the bedside.  Overall he feels his condition is completely resolved.     OBJECTIVE Temp:  [97.9 F (36.6 C)] 97.9 F (36.6 C) (11/30 0020) Pulse Rate:  [53-68] 68 (11/30 1230) Resp:  [11-19] 18 (11/30 1230) BP: (121-154)/(74-96) 147/92 (11/30 1230) SpO2:  [96 %-100 %] 97 % (11/30 1230) Weight:  [74.6 kg (164 lb 7.4 oz)] 74.6 kg (164 lb 7.4 oz) (11/30 0010)  CBC:   Recent Labs Lab 04/24/16 2349 04/25/16 0014  WBC 6.6  --   NEUTROABS 2.0  --   HGB 11.9* 11.9*  HCT 33.2* 35.0*  MCV 89.0  --   PLT 175  --     Basic Metabolic Panel:   Recent Labs Lab 04/24/16 2349 04/25/16 0014  NA 133* 136  K 3.7 3.8  CL 103 100*  CO2 23  --   GLUCOSE 95 91  BUN 19 24*  CREATININE 1.33* 1.30*  CALCIUM 9.1  --     Lipid Panel:     Component Value Date/Time   CHOL 134 04/25/2016 0240   TRIG 56 04/25/2016 0240   HDL 41 04/25/2016 0240   CHOLHDL 3.3 04/25/2016 0240   VLDL 11 04/25/2016 0240   LDLCALC 82 04/25/2016 0240   HgbA1c:  Lab Results  Component Value Date   HGBA1C 11.8 (H) 06/27/2015   Urine Drug Screen:     Component Value Date/Time   LABOPIA NONE  DETECTED 09/19/2014 0325   COCAINSCRNUR NONE DETECTED 09/19/2014 0325   LABBENZ NONE DETECTED 09/19/2014 0325   AMPHETMU NONE DETECTED 09/19/2014 0325   THCU NONE DETECTED 09/19/2014 0325   LABBARB NONE DETECTED 09/19/2014 0325      IMAGING  Mr Jodene Nam Head/brain Wo Cm 04/25/2016 1. No acute ischemia or intracranial hemorrhage.  2. Hyperintense T2 weighted signal within the periaqueductal gray matter, which is in keeping with Wernicke encephalopathy.  3. Age advanced atrophy and chronic microvascular ischemia. Multiple old lacunar infarcts.  4. Normal MRA of the circle of Willis and intracranial arteries.  5. Moderate narrowing of the distal cervical and proximal petrous segment left internal carotid artery.       Ct Head Code Stroke W/o Cm 04/25/2016 1. No acute intracranial abnormality. Atrophy and chronic microvascular ischemia much greater than expected for age.  2. ASPECTS is 10.    PHYSICAL EXAM HEENT-  Normocephalic, no lesions, without obvious abnormality.  Normal external eye and conjunctiva.  Normal TM's bilaterally.  Normal auditory canals and external ears. Normal external nose, mucus membranes and septum.  Normal pharynx. Neck supple with no masses, nodes, nodules or enlargement. Cardiovascular - regular rate and rhythm, S1, S2 normal, no murmur, click, rub or gallop Lungs - chest  clear, no wheezing, rales, normal symmetric air entry Abdomen - soft, non-tender; bowel sounds normal; no masses,  no organomegaly Extremities - no joint deformities, effusion, or inflammation and no edema  Neurologic Examination: Mental Status: Alert, oriented, thought content appropriate.  Speech slightly slurred without evidence of aphasia. Able to follow commands without difficulty. Cranial Nerves: II-Visual fields were normal. III/IV/VI-Pupils were equal and reacted normally to light. Extraocular movements were full and conjugate.    V/VII-no facial numbness and no facial  weakness. VIII-normal. X-normal speech. XI: trapezius strength/neck flexion strength normal bilaterally XII-midline tongue extension with normal strength. Motor: 5/5 bilaterally with normal tone and bulk Sensory: Normal throughout. Deep Tendon Reflexes: 1+ and symmetric. Plantars: Flexor bilaterally Cerebellar: Normal finger-to-nose testing. Carotid auscultation: Normal   ASSESSMENT/PLAN Jackson Shaw is a 60 y.o. male with history of previous stroke, coronary artery disease with previous MI, cardiomyopathy, congestive heart failure, anxiety, HIV infection, hyperlipidemia, hypertension, Kaposi's sarcoma, and diabetes mellitus presenting with slurred speech with right facial weakness and tingling.  He did not receive IV t-PA due to rapid improvement in deficits.  Probable TIA:  Dominant   Resultant  improvement in deficits.  MRI - No acute ischemia or intracranial hemorrhage. Multiple old lacunar infarcts.  MRA - moderate left internal carotid artery stenosis  Carotid Doppler - pending  2D Echo pending  LDL - 82  HgbA1c pending  VTE prophylaxis - Lovenox Diet NPO time specified  aspirin 81 mg daily and clopidogrel 75 mg daily prior to admission, now on aspirin 81 mg daily and clopidogrel 75 mg daily  Patient counseled to be compliant with his antithrombotic medications  Ongoing aggressive stroke risk factor management  Therapy recommendations: pending  Disposition: Pending  Hypertension  Stable  Permissive hypertension (OK if < 220/120) but gradually normalize in 5-7 days  Long-term BP goal normotensive  Hyperlipidemia  Home meds: Lipitor 20 mg daily resumed in hospital  LDL 82, goal < 70  Per pharmacy HIV pts should only receive low dose statins.  Continue statin at discharge  Diabetes  HgbA1c pending, goal < 7.0  Controlled  Other Stroke Risk Factors  Hx stroke/TIA  Family hx stroke (sister)  Coronary artery disease  Other Active  Problems  HIV infection  Mild anemia  Renal insufficiency - BUN 24 ; creatinine 1.3  Hospital day # 0   I have personally examined this patient, reviewed notes, independently viewed imaging studies, participated in medical decision making and plan of care.ROS completed by me personally and pertinent positives fully documented  I have made any additions or clarifications directly to the above note. He presented for transient right facial droop and slurred speech due to TIA.Marland Kitchen Recommend ongoing stroke evaluation. Check TEE and loop recorder insertion. Discussed with patient, wife and son and answered questions. Greater than 50% time during this 35 minute visit was spent on counseling and coordination of care about stroke and TIA risk, prevention and treatment.  Antony Contras, MD Medical Director [facility] Pager: [phone] 04/25/2016 3:53 PM   To contact Stroke Continuity provider, please refer to [email or web address] After hours, contact General Neurology

## 2016-04-26 ENCOUNTER — Observation Stay (HOSPITAL_BASED_OUTPATIENT_CLINIC_OR_DEPARTMENT_OTHER): Payer: Medicaid Other

## 2016-04-26 DIAGNOSIS — G459 Transient cerebral ischemic attack, unspecified: Secondary | ICD-10-CM | POA: Diagnosis not present

## 2016-04-26 DIAGNOSIS — I639 Cerebral infarction, unspecified: Secondary | ICD-10-CM

## 2016-04-26 DIAGNOSIS — I6300 Cerebral infarction due to thrombosis of unspecified precerebral artery: Secondary | ICD-10-CM | POA: Diagnosis not present

## 2016-04-26 DIAGNOSIS — I63312 Cerebral infarction due to thrombosis of left middle cerebral artery: Secondary | ICD-10-CM | POA: Diagnosis not present

## 2016-04-26 DIAGNOSIS — K210 Gastro-esophageal reflux disease with esophagitis: Secondary | ICD-10-CM | POA: Diagnosis not present

## 2016-04-26 DIAGNOSIS — B20 Human immunodeficiency virus [HIV] disease: Secondary | ICD-10-CM | POA: Diagnosis not present

## 2016-04-26 LAB — COMPREHENSIVE METABOLIC PANEL
ALT: 12 U/L — ABNORMAL LOW (ref 17–63)
ANION GAP: 8 (ref 5–15)
AST: 17 U/L (ref 15–41)
Albumin: 3.3 g/dL — ABNORMAL LOW (ref 3.5–5.0)
Alkaline Phosphatase: 68 U/L (ref 38–126)
BILIRUBIN TOTAL: 0.7 mg/dL (ref 0.3–1.2)
BUN: 19 mg/dL (ref 6–20)
CALCIUM: 9 mg/dL (ref 8.9–10.3)
CO2: 26 mmol/L (ref 22–32)
Chloride: 101 mmol/L (ref 101–111)
Creatinine, Ser: 1.45 mg/dL — ABNORMAL HIGH (ref 0.61–1.24)
GFR calc Af Amer: 59 mL/min — ABNORMAL LOW (ref >60)
GFR, EST NON AFRICAN AMERICAN: 51 mL/min — AB (ref >60)
Glucose, Bld: 283 mg/dL — ABNORMAL HIGH (ref 65–99)
POTASSIUM: 3.8 mmol/L (ref 3.5–5.1)
Sodium: 135 mmol/L (ref 135–145)
TOTAL PROTEIN: 6.8 g/dL (ref 6.5–8.1)

## 2016-04-26 LAB — CBC
HEMATOCRIT: 36.7 % — AB (ref 39.0–52.0)
Hemoglobin: 12.7 g/dL — ABNORMAL LOW (ref 13.0–17.0)
MCH: 31.1 pg (ref 26.0–34.0)
MCHC: 34.6 g/dL (ref 30.0–36.0)
MCV: 90 fL (ref 78.0–100.0)
Platelets: 161 10*3/uL (ref 150–400)
RBC: 4.08 MIL/uL — ABNORMAL LOW (ref 4.22–5.81)
RDW: 12.3 % (ref 11.5–15.5)
WBC: 4.6 10*3/uL (ref 4.0–10.5)

## 2016-04-26 LAB — GLUCOSE, CAPILLARY
GLUCOSE-CAPILLARY: 118 mg/dL — AB (ref 65–99)
Glucose-Capillary: 233 mg/dL — ABNORMAL HIGH (ref 65–99)

## 2016-04-26 LAB — ECHOCARDIOGRAM COMPLETE
HEIGHTINCHES: 68 in
WEIGHTICAEL: 2529.12 [oz_av]

## 2016-04-26 LAB — VAS US CAROTID
LCCADDIAS: 16 cm/s
LCCAPSYS: 129 cm/s
LEFT ECA DIAS: -16 cm/s
LEFT VERTEBRAL DIAS: 8 cm/s
LICADDIAS: -37 cm/s
LICADSYS: -122 cm/s
LICAPDIAS: -41 cm/s
LICAPSYS: -126 cm/s
Left CCA dist sys: 87 cm/s
Left CCA prox dias: 26 cm/s
RIGHT ECA DIAS: -11 cm/s
RIGHT VERTEBRAL DIAS: 19 cm/s
Right CCA prox dias: 14 cm/s
Right CCA prox sys: 122 cm/s
Right cca dist sys: -58 cm/s

## 2016-04-26 MED ORDER — PERFLUTREN LIPID MICROSPHERE
1.0000 mL | INTRAVENOUS | Status: AC | PRN
  Administered 2016-04-26: 2 mL via INTRAVENOUS
  Filled 2016-04-26: qty 10

## 2016-04-26 MED ORDER — FREESTYLE LANCETS MISC: 12 refills | Status: DC

## 2016-04-26 NOTE — Hospital Discharge Follow-Up (Signed)
Transitional Care Clinic Care Coordination Note:  Admit date:  04/24/16 Discharge date: 04/26/16 Discharge Disposition: Home Patient contact: [phone] (mobile) Emergency contact(s): none  This Case Manager reviewed patient's EMR and determined patient would benefit from post-discharge medical management and chronic care management services through the [facility]. Patient has a history of stroke, HTN, HLD, DM, CAD, CHF, HIV, CKD. Patient has had two inpatient admissions and 1 ED visit in the last year. This Case Manager met with patient to discuss the services and medical management that can be provided at the [facility]. Patient verbalized understanding and agreed to receive post-discharge care at the [facility].   Patient scheduled for Transitional Care appointment on 04/30/16 at 1000 with Dr. Jarold Song.  Clinic information and appointment time provided to patient. Appointment information also placed on AVS.  Assessment:       Home Environment: Patient lives with significant other and roommates in a private residence.       Support System: friends, significant other       Level of functioning: independent       Home DME: Patient uses a cane for mobility. Patient also has a walker that he uses when needed. Patient has a glucometer and indicates he checks his blood glucose "2-3x/day." Encouraged patient to start a blood glucose log after discharge and to bring record to upcoming appointment. Patient verbalized understanding. Patient indicated he needed a refill of diabetes test strips. Jacqualin Combes, RN CM informed. She indicated she would notify provider.        Home care services: none       Transportation: Patient drives to his appointments.        Food/Nutrition: Patient indicates he has access to needed food.        Medications: Patient typically gets his medications from [facility] on [address]. Patient denies problems affording or obtaining  needed medications.        Identified Barriers: recent loss of mother and father. May benefit from speaking with Education officer, museum at [place] at appointment for emotional support.        PCP: Dr. Jarold Song [facility])            Arranged services:        Services communicated to Azzie Almas, RN CM

## 2016-04-26 NOTE — Progress Notes (Signed)
  Echocardiogram 2D Echocardiogram with Definity has been performed.  Diamond Nickel 04/26/2016, 4:32 PM

## 2016-04-26 NOTE — Progress Notes (Signed)
*  PRELIMINARY RESULTS* Vascular Ultrasound Carotid Duplex (Doppler) has been completed.   Findings suggest upper range 1-39% right internal carotid artery stenosis and low range 40-59% left internal carotid artery stenosis. Vertebral arteries are patent with antegrade flow.  04/26/2016 9:15 AM Maudry Mayhew, BS, RVT, RDCS, RDMS

## 2016-04-26 NOTE — Progress Notes (Signed)
OT Cancellation Note  Patient Details Name: Tyran Cordes MRN: [id number] DOB: 1955/10/25   Cancelled Treatment:    Reason Eval/Treat Not Completed: Other (comment) (Pt with bed rest order.) Please discontinue bed rest order once medically appropriate. Will hold off on OT eval for now and reattempt once pt no longer has bed rest order.   Tyrone Schimke OTR/L Pager: [phone]   04/26/2016, 8:45 AM

## 2016-04-26 NOTE — Care Management Note (Signed)
Case Management Note  Patient Details  Name: Jackson Shaw MRN: [id number] Date of Birth: Sep 25, 1955  Subjective/Objective:                    Action/Plan: CM consulted for PCP appointment in next 3-5 days. Pt is active with [place] and Jessica with the [place] team at [facility] saw patient and set him up an appointment for Tuesday the 5th. Pt states he needs a script for glucose strips. Page sent to Dr Allyson Sabal. CM continuing to follow.  Expected Discharge Date:                  Expected Discharge Plan:     In-House Referral:     Discharge planning Services     Post Acute Care Choice:    Choice offered to:     DME Arranged:    DME Agency:     HH Arranged:    HH Agency:     Status of Service:     If discussed at [employer], dates discussed:    Additional Comments:  Pollie Friar, RN 04/26/2016, 2:45 PM

## 2016-04-26 NOTE — Evaluation (Signed)
Physical Therapy Evaluation and Discharge  Patient Details Name: Jackson Shaw MRN: [id number] DOB: [birth year] Today's Date: 04/26/2016   History of Present Illness  60 y.o. male with medical history significant of stroke, hypertension, hyperlipidemia, diabetes mellitus, GERD, hypothyroidism, CAD, s/p of Age and a stent placement, Kaposi's sarcoma, HIV disease CD4 410 on 06/27/15), duodenal ulcer, sCHF, CKD-II, s/p of loop recorder implant ([employer]onic Reveal Model # [id number] MR safe), who presents with a slurred speech, right facial droop and numbness. MRI brain negative for acute changes  Clinical Impression  Patient evaluated by Physical Therapy with no further PT needs identified. Patient has baseline gait anomalies since childhood. He has had no falls and uses his cane regularly.  PT is signing off. Thank you for this referral.     Follow Up Recommendations No PT follow up    Equipment Recommendations  None recommended by PT    Recommendations for Other Services       Precautions / Restrictions Precautions Precautions: Fall Precaution Comments: prior brainstem CVAs; denies falls       Mobility  Bed Mobility                  Transfers Overall transfer level: Modified independent Equipment used: None;Straight cane                Ambulation/Gait Ambulation/Gait assistance: Min guard;Modified independent (Device/Increase time) Ambulation Distance (Feet): 200 Feet Assistive device: Straight cane Gait Pattern/deviations: Step-through pattern;Shuffle;Decreased stride length   Gait velocity interpretation: Below normal speed for age/gender General Gait Details: bil knee flexion in stance phase; Rt more than Lt; denies falls and has walked this way since childhood.   Stairs Stairs: Yes Stairs assistance: Modified independent (Device/Increase time) Stair Management: No rails;Step to pattern;Forwards;With cane Number of Stairs: 5 General stair comments: no knee  instability as ascending/descending stairs  Wheelchair Mobility    Modified Rankin (Stroke Patients Only)       Balance Overall balance assessment: Modified Independent                               Standardized Balance Assessment Standardized Balance Assessment : Dynamic Gait Index   Dynamic Gait Index Level Surface: Mild Impairment Change in Gait Speed: Mild Impairment Gait with Horizontal Head Turns: Mild Impairment Gait with Vertical Head Turns: Mild Impairment Gait and Pivot Turn: Normal Step Over Obstacle: Mild Impairment Step Around Obstacles: Normal Steps: Moderate Impairment Total Score: 17       Pertinent Vitals/Pain Pain Assessment: No/denies pain    Home Living Family/patient expects to be discharged to:: Private residence Living Arrangements: Spouse/significant other (partner-Russell) Available Help at Discharge: Family;Friend(s);Available 24 hours/day Type of Home: House Home Access: Stairs to enter Entrance Stairs-Rails: None Entrance Stairs-Number of Steps: 2 Home Layout: One level Home Equipment: Cane - single point;Walker - 2 wheels;Shower seat      Prior Function Level of Independence: Independent with assistive device(s)         Comments: mostly uses cane (vs no device)     Hand Dominance        Extremity/Trunk Assessment   Upper Extremity Assessment: Overall WFL for tasks assessed           Lower Extremity Assessment: RLE deficits/detail;LLE deficits/detail RLE Deficits / Details: 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; had shortening surgery) RLE worse than LLE LLE Deficits / Details: has Rt knee  functional weakness in weightbearing due to 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; had shortening surgery) RLE worse than LLE  Cervical / Trunk Assessment: Normal  Communication   Communication: Expressive difficulties  Cognition Arousal/Alertness: Awake/alert Behavior During  Therapy: WFL for tasks assessed/performed Overall Cognitive Status: Within Functional Limits for tasks assessed                      General Comments      Exercises     Assessment/Plan    PT Assessment Patent does not need any further PT services  PT Problem List            PT Treatment Interventions      PT Goals (Current goals can be found in the Care Plan section)  Acute Rehab PT Goals Patient Stated Goal: go home today PT Goal Formulation: All assessment and education complete, DC therapy    Frequency     Barriers to discharge        Co-evaluation               End of Session Equipment Utilized During Treatment: Gait belt Activity Tolerance: Patient tolerated treatment well Patient left: in chair;with call bell/phone within reach Nurse Communication: Mobility status (OK to d/c-at baseline)    Functional Assessment Tool Used: clinical judgement Functional Limitation: Mobility: Walking and moving around Mobility: Walking and Moving Around Current Status [id number]): At least 1 percent but less than 20 percent impaired, limited or restricted Mobility: Walking and Moving Around Goal Status [phone]): At least 1 percent but less than 20 percent impaired, limited or restricted Mobility: Walking and Moving Around Discharge Status [id number]): At least 1 percent but less than 20 percent impaired, limited or restricted    Time: [id number] PT Time Calculation (min) (ACUTE ONLY): 20 min   Charges:   PT Evaluation $PT Eval Low Complexity: 1 Procedure     PT G Codes:   PT G-Codes **NOT FOR INPATIENT CLASS** Functional Assessment Tool Used: clinical judgement Functional Limitation: Mobility: Walking and moving around Mobility: Walking and Moving Around Current Status [id number]): At least 1 percent but less than 20 percent impaired, limited or restricted Mobility: Walking and Moving Around Goal Status [phone]): At least 1 percent but less than 20 percent impaired,  limited or restricted Mobility: Walking and Moving Around Discharge Status [phone]): At least 1 percent but less than 20 percent impaired, limited or restricted    Jackson Shaw 04/26/2016, 10:34 AM Pager [phone]

## 2016-04-26 NOTE — Progress Notes (Signed)
STROKE TEAM PROGRESS NOTE   HISTORY OF PRESENT ILLNESS (per record) Jackson Shaw is an 60 y.o. male with a history of stroke, coronary artery disease, hypertension, hyperlipidemia, diabetes mellitus and HIV infection, presenting following acute onset of tingling and weakness involving right side of his face as well as slurred speech. He also noticed slight tingling in both hands. Facial droop subsequently resolved, as did tingling. Slurred speech improved, but did not completely resolve. He's been taking aspirin and Plavix daily. CT scan of his head showed no acute intracranial abnormality. NIH stroke score at the time of this evaluation was 1 for persistent slurred speech.  LSN: 11:15 PM on 04/24/2016 tPA Given: No: Deficits rapidly resolving mRankin:    SUBJECTIVE (INTERVAL HISTORY) His  wfe and son are   at the bedside.  Overall he feels his condition is completely resolved.     OBJECTIVE Temp:  [98 F (36.7 C)-98.2 F (36.8 C)] 98.2 F (36.8 C) (12/01 0400) Pulse Rate:  [62-74] 62 (12/01 0400) Cardiac Rhythm: Normal sinus rhythm (11/30 2345) Resp:  [11-18] 18 (12/01 0400) BP: (110-158)/(82-94) 153/91 (12/01 0400) SpO2:  [96 %-99 %] 96 % (12/01 0400) Weight:  [71.7 kg (158 lb 1.1 oz)] 71.7 kg (158 lb 1.1 oz) (11/30 1538)  CBC:   Recent Labs Lab 04/24/16 2349 04/25/16 0014 04/26/16 0524  WBC 6.6  --  4.6  NEUTROABS 2.0  --   --   HGB 11.9* 11.9* 12.7*  HCT 33.2* 35.0* 36.7*  MCV 89.0  --  90.0  PLT 175  --  [id number]    Basic Metabolic Panel:   Recent Labs Lab 04/24/16 2349 04/25/16 0014 04/26/16 0524  NA 133* 136 135  K 3.7 3.8 3.8  CL 103 100* 101  CO2 23  --  26  GLUCOSE 95 91 283*  BUN 19 24* 19  CREATININE 1.33* 1.30* 1.45*  CALCIUM 9.1  --  9.0    Lipid Panel:     Component Value Date/Time   CHOL 134 04/25/2016 0240   TRIG 56 04/25/2016 0240   HDL 41 04/25/2016 0240   CHOLHDL 3.3 04/25/2016 0240   VLDL 11 04/25/2016 0240   LDLCALC 82 04/25/2016  0240   HgbA1c:  Lab Results  Component Value Date   HGBA1C 10.3 (H) 04/25/2016   Urine Drug Screen:     Component Value Date/Time   LABOPIA NONE DETECTED 09/19/2014 0325   COCAINSCRNUR NONE DETECTED 09/19/2014 0325   LABBENZ NONE DETECTED 09/19/2014 0325   AMPHETMU NONE DETECTED 09/19/2014 0325   THCU NONE DETECTED 09/19/2014 0325   LABBARB NONE DETECTED 09/19/2014 0325      IMAGING  Mr Jodene Nam Head/brain Wo Cm 04/25/2016 1. No acute ischemia or intracranial hemorrhage.  2. Hyperintense T2 weighted signal within the periaqueductal gray matter, which is in keeping with Wernicke encephalopathy.  3. Age advanced atrophy and chronic microvascular ischemia. Multiple old lacunar infarcts.  4. Normal MRA of the circle of Willis and intracranial arteries.  5. Moderate narrowing of the distal cervical and proximal petrous segment left internal carotid artery.       Ct Head Code Stroke W/o Cm 04/25/2016 1. No acute intracranial abnormality. Atrophy and chronic microvascular ischemia much greater than expected for age.  2. ASPECTS is 10.    PHYSICAL EXAM HEENT-  Normocephalic, no lesions, without obvious abnormality.  Normal external eye and conjunctiva.  Normal TM's bilaterally.  Normal auditory canals and external ears. Normal external nose, mucus membranes and septum.  Normal pharynx. Neck supple with no masses, nodes, nodules or enlargement. Cardiovascular - regular rate and rhythm, S1, S2 normal, no murmur, click, rub or gallop Lungs - chest clear, no wheezing, rales, normal symmetric air entry Abdomen - soft, non-tender; bowel sounds normal; no masses,  no organomegaly Extremities - no joint deformities, effusion, or inflammation and no edema  Neurologic Examination: Mental Status: Alert, oriented, thought content appropriate.  Speech slightly slurred without evidence of aphasia. Able to follow commands without difficulty. Cranial Nerves: II-Visual fields were  normal. III/IV/VI-Pupils were equal and reacted normally to light. Extraocular movements were full and conjugate.    V/VII-no facial numbness and no facial weakness. VIII-normal. X-normal speech. XI: trapezius strength/neck flexion strength normal bilaterally XII-midline tongue extension with normal strength. Motor: 5/5 bilaterally with normal tone and bulk Sensory: Normal throughout. Deep Tendon Reflexes: 1+ and symmetric. Plantars: Flexor bilaterally Cerebellar: Normal finger-to-nose testing. Carotid auscultation: Normal   ASSESSMENT/PLAN [name] is a 60 y.o. male with history of previous stroke, coronary artery disease with previous MI, cardiomyopathy, congestive heart failure, anxiety, HIV infection, hyperlipidemia, hypertension, Kaposi's sarcoma, and diabetes mellitus presenting with slurred speech with right facial weakness and tingling.  He did not receive IV t-PA due to rapid improvement in deficits.  Probable TIA:  Dominant   Resultant  improvement in deficits.  MRI - No acute ischemia or intracranial hemorrhage. Multiple old lacunar infarcts.  MRA - moderate left internal carotid artery stenosis  Carotid Doppler - 1-39% right internal carotid artery stenosis and low range 40-59% left internal carotid artery stenosis. Vertebral arteries are patent with antegrade flow.  2D Echo pending  LDL - 82  HgbA1c 10.3  VTE prophylaxis - Lovenox Diet Carb Modified Fluid consistency: Thin; Room service appropriate? Yes  aspirin 81 mg daily and clopidogrel 75 mg daily prior to admission, now on aspirin 81 mg daily and clopidogrel 75 mg daily  Patient counseled to be compliant with his antithrombotic medications  Ongoing aggressive stroke risk factor management  Therapy recommendations: pending  Disposition: Pending  Hypertension  Stable  Permissive hypertension (OK if < 220/120) but gradually normalize in 5-7 days  Long-term BP goal  normotensive  Hyperlipidemia  Home meds: Lipitor 20 mg daily resumed in hospital  LDL 82, goal < 70  Per pharmacy HIV pts should only receive low dose statins.  Continue statin at discharge  Diabetes  HgbA1c 7.3 goal < 7.0  Controlled  Other Stroke Risk Factors  Hx stroke/TIA  Family hx stroke (sister)  Coronary artery disease  Other Active Problems  HIV infection  Mild anemia  Renal insufficiency - BUN 24 ; creatinine 1.3  Hospital day # 0   I have personally examined this patient, reviewed notes, independently viewed imaging studies, participated in medical decision making and plan of care.ROS completed by me personally and pertinent positives fully documented  I have made any additions or clarifications directly to the above note. He presented for transient right facial droop and slurred speech due to TIA.Marland Kitchen Recommend ongoing stroke evaluation. Check TEE and loop recorder insertion but this can be arranged as an outpatient. Discussed with Dr. Allyson Sabal..  . Greater than 50% time during this 25 minute visit was spent on counseling and coordination of care about stroke and TIA risk, prevention and treatment. Follow-up as an outpatient in the stroke clinic in 6 weeks.  Antony Contras, MD Medical Director [facility] Pager: [phone] 04/26/2016 2:19 PM To contact Stroke Continuity provider, please refer to  [email or web address] After hours, contact General Neurology

## 2016-04-26 NOTE — Progress Notes (Signed)
Patient is being discharged home. Discharged instructions were given to patient and family

## 2016-04-26 NOTE — Discharge Summary (Addendum)
Physician Discharge Summary  Jackson Shaw MRN: [id number] DOB/AGE: 59-Oct-1957 59 y.o.  PCP: Arnoldo Morale, MD   Admit date: 04/24/2016 Discharge date: 04/26/2016  Discharge Diagnoses:   Principal Problem:   Stroke (cerebrum) ([place]) Active Problems:   Human immunodeficiency virus (HIV) disease ([place])   Hypothyroidism   Insulin dependent type 2 diabetes mellitus, uncontrolled ([place])   HYPERCHOLESTEROLEMIA   S/P CABG x 5   GASTROESOPHAGEAL REFLUX DISEASE   RENAL DISEASE, CHRONIC, STAGE II   Status post placement of implantable loop recorder   Coronary artery disease involving coronary bypass graft of native heart with unstable angina pectoris ([place])   Hypertension    Follow-up recommendations Follow-up with PCP in 3-5 days , including all  additional recommended appointments as below Follow-up CBC, CMP in 3-5 days Patient would need TEE and loop recorder as outpatient-discussed with Dr. Leonie Man, prior to discharge Cardiology notified and this will be set up as an outpatient       Current Discharge Medication List    CONTINUE these medications which have NOT CHANGED   Details  aspirin EC 81 MG tablet Take 81 mg by mouth daily.     atorvastatin (LIPITOR) 20 MG tablet Take 1 tablet (20 mg total) by mouth daily. Qty: 30 tablet, Refills: 11   Associated Diagnoses: S/P CABG x 5; Chest pain, unspecified chest pain type    citalopram (CELEXA) 10 MG tablet Take 10 mg by mouth daily.     clopidogrel (PLAVIX) 75 MG tablet Take 1 tablet (75 mg total) by mouth daily. Qty: 30 tablet, Refills: 11   Associated Diagnoses: S/P CABG x 5; Chest pain, unspecified chest pain type    Darunavir Ethanolate (PREZISTA) 800 MG tablet Take 800 mg by mouth daily.     emtricitabine-tenofovir (TRUVADA) 200-300 MG per tablet Take 1 tablet by mouth daily.    insulin aspart (NOVOLOG) 100 UNIT/ML injection Inject 6 Units into the skin 3 (three) times daily with meals. 6 units is the base. Add 2 units for  each 15gm of carbs    Insulin Glargine (LANTUS SOLOSTAR) 100 UNIT/ML Solostar Pen Inject 20 Units into the skin daily at 10 pm. Qty: 5 pen, Refills: 2   Associated Diagnoses: Type 2 diabetes mellitus with complication, with long-term current use of insulin ([facility])    isosorbide mononitrate (IMDUR) 30 MG 24 hr tablet Take 1 tablet (30 mg total) by mouth daily. Qty: 30 tablet, Refills: 11   Associated Diagnoses: S/P CABG x 5; Chest pain, unspecified chest pain type    levothyroxine (SYNTHROID, LEVOTHROID) 75 MCG tablet Take 4 tablets (300 mcg total) by mouth daily before breakfast. Must have office visit for refills Qty: 120 tablet, Refills: 0    metoprolol succinate (TOPROL XL) 25 MG 24 hr tablet Take 1 tablet (25 mg total) by mouth daily. Qty: 30 tablet, Refills: 11   Associated Diagnoses: S/P CABG x 5; Chest pain, unspecified chest pain type    nitroGLYCERIN (NITROSTAT) 0.4 MG SL tablet Place 1 tablet (0.4 mg total) under the tongue every 5 (five) minutes as needed for chest pain. Qty: 25 tablet, Refills: 3    pantoprazole (PROTONIX) 40 MG tablet Take 40 mg by mouth daily.     ritonavir (NORVIR) 100 MG capsule Take 100 mg by mouth daily.     albuterol (PROVENTIL HFA;VENTOLIN HFA) 108 (90 Base) MCG/ACT inhaler Inhale 2 puffs into the lungs every 6 (six) hours as needed for wheezing or shortness of breath. Qty: 1 Inhaler, Refills:  0    benzonatate (TESSALON) 100 MG capsule Take 1 capsule (100 mg total) by mouth every 8 (eight) hours. Qty: 21 capsule, Refills: 0    doxycycline (VIBRAMYCIN) 100 MG capsule Take 1 capsule (100 mg total) by mouth 2 (two) times daily. Qty: 10 capsule, Refills: 0    feeding supplement, ENSURE ENLIVE, (ENSURE ENLIVE) LIQD Take 237 mLs by mouth 2 (two) times daily between meals. Qty: 237 mL, Refills: 12    gabapentin (NEURONTIN) 300 MG capsule One capsule at night for 2 weeks, then take one capsule twice a day Qty: 60 capsule, Refills: 3    Insulin Pen  Needle 31G X 5 MM MISC 1 each by Does not apply route at bedtime. Qty: 30 each, Refills: 5   Associated Diagnoses: Type 2 diabetes mellitus with complication, with long-term current use of insulin [facility])         Discharge Condition: Stable    Discharge Instructions Get Medicines reviewed and adjusted: Please take all your medications with you for your next visit with your Primary MD  Please request your Primary MD to go over all hospital tests and procedure/radiological results at the follow up, please ask your Primary MD to get all Hospital records sent to his/her office.  If you experience worsening of your admission symptoms, develop shortness of breath, life threatening emergency, suicidal or homicidal thoughts you must seek medical attention immediately by calling 911 or calling your MD immediately if symptoms less severe.  You must read complete instructions/literature along with all the possible adverse reactions/side effects for all the Medicines you take and that have been prescribed to you. Take any new Medicines after you have completely understood and accpet all the possible adverse reactions/side effects.   Do not drive when taking Pain medications.   Do not take more than prescribed Pain, Sleep and Anxiety Medications  Special Instructions: If you have smoked or chewed Tobacco in the last 2 yrs please stop smoking, stop any regular Alcohol and or any Recreational drug use.  Wear Seat belts while driving.  Please note  You were cared for by a hospitalist during your hospital stay. Once you are discharged, your primary care physician will handle any further medical issues. Please note that NO REFILLS for any discharge medications will be authorized once you are discharged, as it is imperative that you return to your primary care physician (or establish a relationship with a primary care physician if you do not have one) for your aftercare needs so that they can reassess  your need for medications and monitor your lab values.     Allergies  Allergen Reactions  . Penicillins Anaphylaxis  . Codeine Nausea Only      Disposition: 01-Home or Self Care   Consults:  Neurology     Significant Diagnostic Studies:  Mr Brain 45 Contrast  Result Date: 04/25/2016 CLINICAL DATA:  Slurred speech, right facial droop EXAM: MRI HEAD WITHOUT CONTRAST MRA HEAD WITHOUT CONTRAST TECHNIQUE: Multiplanar, multiecho pulse sequences of the brain and surrounding structures were obtained without intravenous contrast. Angiographic images of the head were obtained using MRA technique without contrast. COMPARISON:  Head CT 04/24/2016 FINDINGS: MRI HEAD FINDINGS Brain: No acute infarct or intraparenchymal hemorrhage. There is focal hyperintense T2 weighted signal within the periaqueductal gray matter. Additionally, there is diffuse confluent T2 hyperintensity within the periventricular and deep white matter. There are multiple bilateral thalamic, basal ganglia and centrum semiovale lacunar infarcts, which are old. No mass lesion or midline  shift. No hydrocephalus or extra-axial fluid collection. There is advanced atrophy for age. The midline structures are normal. Vascular: Major intracranial arterial and venous sinus flow voids are preserved. No evidence of chronic microhemorrhage or amyloid angiopathy. Skull and upper cervical spine: The visualized skull base, calvarium, upper cervical spine and extracranial soft tissues are normal. Sinuses/Orbits: No fluid levels or advanced mucosal thickening. No mastoid effusion. Normal orbits. MRA HEAD FINDINGS Intracranial internal carotid arteries: There is moderate narrowing of the left internal carotid artery at the proximal P2 segment. Otherwise, normal. Anterior cerebral arteries: Normal. Middle cerebral arteries: Normal. Posterior communicating arteries: Absent bilaterally. Posterior cerebral arteries: Normal. Basilar artery: Normal. Vertebral  arteries: Right-dominant. Normal. Superior cerebellar arteries: Normal. Anterior inferior cerebellar arteries: Visualized on the right, not seen on the left. This is not uncommon. Posterior inferior cerebellar arteries: Normal. IMPRESSION: 1. No acute ischemia or intracranial hemorrhage. 2. Hyperintense T2 weighted signal within the periaqueductal gray matter, which is in keeping with Wernicke encephalopathy. 3. Age advanced atrophy and chronic microvascular ischemia. Multiple old lacunar infarcts. 4. Normal MRA of the circle of Willis and intracranial arteries. 5. Moderate narrowing of the distal cervical and proximal petrous segment left internal carotid artery. Electronically Signed   By: Ulyses Jarred M.D.   On: 04/25/2016 04:42   Mr Jodene Nam Head/brain ZJ Cm  Result Date: 04/25/2016 CLINICAL DATA:  Slurred speech, right facial droop EXAM: MRI HEAD WITHOUT CONTRAST MRA HEAD WITHOUT CONTRAST TECHNIQUE: Multiplanar, multiecho pulse sequences of the brain and surrounding structures were obtained without intravenous contrast. Angiographic images of the head were obtained using MRA technique without contrast. COMPARISON:  Head CT 04/24/2016 FINDINGS: MRI HEAD FINDINGS Brain: No acute infarct or intraparenchymal hemorrhage. There is focal hyperintense T2 weighted signal within the periaqueductal gray matter. Additionally, there is diffuse confluent T2 hyperintensity within the periventricular and deep white matter. There are multiple bilateral thalamic, basal ganglia and centrum semiovale lacunar infarcts, which are old. No mass lesion or midline shift. No hydrocephalus or extra-axial fluid collection. There is advanced atrophy for age. The midline structures are normal. Vascular: Major intracranial arterial and venous sinus flow voids are preserved. No evidence of chronic microhemorrhage or amyloid angiopathy. Skull and upper cervical spine: The visualized skull base, calvarium, upper cervical spine and extracranial  soft tissues are normal. Sinuses/Orbits: No fluid levels or advanced mucosal thickening. No mastoid effusion. Normal orbits. MRA HEAD FINDINGS Intracranial internal carotid arteries: There is moderate narrowing of the left internal carotid artery at the proximal P2 segment. Otherwise, normal. Anterior cerebral arteries: Normal. Middle cerebral arteries: Normal. Posterior communicating arteries: Absent bilaterally. Posterior cerebral arteries: Normal. Basilar artery: Normal. Vertebral arteries: Right-dominant. Normal. Superior cerebellar arteries: Normal. Anterior inferior cerebellar arteries: Visualized on the right, not seen on the left. This is not uncommon. Posterior inferior cerebellar arteries: Normal. IMPRESSION: 1. No acute ischemia or intracranial hemorrhage. 2. Hyperintense T2 weighted signal within the periaqueductal gray matter, which is in keeping with Wernicke encephalopathy. 3. Age advanced atrophy and chronic microvascular ischemia. Multiple old lacunar infarcts. 4. Normal MRA of the circle of Willis and intracranial arteries. 5. Moderate narrowing of the distal cervical and proximal petrous segment left internal carotid artery. Electronically Signed   By: Ulyses Jarred M.D.   On: 04/25/2016 04:42   Ct Head Code Stroke W/o Cm  Result Date: 04/25/2016 CLINICAL DATA:  Code stroke.  Right-sided facial numbness EXAM: CT HEAD WITHOUT CONTRAST TECHNIQUE: Contiguous axial images were obtained from the base of the skull through the  vertex without intravenous contrast. COMPARISON:  Head CT 08/29/2015 FINDINGS: Brain: No mass lesion, intraparenchymal hemorrhage or extra-axial collection. No evidence of acute cortical infarct. There is age advanced atrophy. Insert hypo Vascular: No hyperdense vessel or unexpected calcification. Skull: Normal visualized skull base, calvarium and extracranial soft tissues. Sinuses/Orbits: No sinus fluid levels or advanced mucosal thickening. No mastoid effusion. Normal  orbits. ASPECTS [facility] Stroke Program Early CT Score) - Ganglionic level infarction (caudate, lentiform nuclei, internal capsule, insula, M1-M3 cortex): 7 - Supraganglionic infarction (M4-M6 cortex): 3 Total score (0-10 with 10 being normal): 10 IMPRESSION: 1. No acute intracranial abnormality. Atrophy and chronic microvascular ischemia much greater than expected for age. 2. ASPECTS is 10. These results were called by telephone at the time of interpretation on 04/25/2016 at 12:07 am to Dr. Wallie Char, who verbally acknowledged these results. Electronically Signed   By: Ulyses Jarred M.D.   On: 04/25/2016 00:08     Echocardiogram LV EF: 20% -   25%  ------------------------------------------------------------------- Indications:      TIA 435.9.  ------------------------------------------------------------------- History:   PMH:  HIV.  Coronary artery disease.  Congestive heart failure.  Stroke.  PMH:   Myocardial infarction.  Risk factors: Hypertension. Diabetes mellitus. Dyslipidemia.  ------------------------------------------------------------------- Study Conclusions  - Left ventricle: The cavity size was normal. There was mild focal   basal hypertrophy of the septum. Systolic function was severely   reduced. The estimated ejection fraction was in the range of 20%   to 25%. Diffuse hypokinesis. Doppler parameters are consistent   with abnormal left ventricular relaxation (grade 1 diastolic   dysfunction). - Aortic valve: Trileaflet; moderately thickened, severely   calcified leaflets.  Impressions:  - No cardiac source of emboli was indentified. Compared to the   prior study, there has been no significant interval change   Filed Weights   04/25/16 0010 04/25/16 1538  Weight: 74.6 kg (164 lb 7.4 oz) 71.7 kg (158 lb 1.1 oz)     Microbiology: No results found for this or any previous visit (from the past 240 hour(s)).     Blood Culture    Component Value  Date/Time   SDES URINE, RANDOM 01/23/2016 0558   SPECREQUEST NONE 01/23/2016 0558   CULT <10,000 COLONIES/mL INSIGNIFICANT GROWTH (A) 01/23/2016 0558   REPTSTATUS 01/24/2016 FINAL 01/23/2016 0558      Labs: Results for orders placed or performed during the hospital encounter of 04/24/16 (from the past 48 hour(s))  Protime-INR     Status: None   Collection Time: 04/24/16 11:49 PM  Result Value Ref Range   Prothrombin Time 13.3 11.4 - 15.2 seconds   INR 1.01   APTT     Status: None   Collection Time: 04/24/16 11:49 PM  Result Value Ref Range   aPTT 29 24 - 36 seconds  CBC     Status: Abnormal   Collection Time: 04/24/16 11:49 PM  Result Value Ref Range   WBC 6.6 4.0 - 10.5 K/uL   RBC 3.73 (L) 4.22 - 5.81 MIL/uL   Hemoglobin 11.9 (L) 13.0 - 17.0 g/dL   HCT 33.2 (L) 39.0 - 52.0 %   MCV 89.0 78.0 - 100.0 fL   MCH 31.9 26.0 - 34.0 pg   MCHC 35.8 30.0 - 36.0 g/dL   RDW 12.3 11.5 - 15.5 %   Platelets 175 150 - 400 K/uL  Differential     Status: None   Collection Time: 04/24/16 11:49 PM  Result Value Ref Range   Neutrophils  Relative % 31 %   Neutro Abs 2.0 1.7 - 7.7 K/uL   Lymphocytes Relative 58 %   Lymphs Abs 3.8 0.7 - 4.0 K/uL   Monocytes Relative 9 %   Monocytes Absolute 0.6 0.1 - 1.0 K/uL   Eosinophils Relative 2 %   Eosinophils Absolute 0.2 0.0 - 0.7 K/uL   Basophils Relative 0 %   Basophils Absolute 0.0 0.0 - 0.1 K/uL  Comprehensive metabolic panel     Status: Abnormal   Collection Time: 04/24/16 11:49 PM  Result Value Ref Range   Sodium 133 (L) 135 - 145 mmol/L   Potassium 3.7 3.5 - 5.1 mmol/L   Chloride 103 101 - 111 mmol/L   CO2 23 22 - 32 mmol/L   Glucose, Bld 95 65 - 99 mg/dL   BUN 19 6 - 20 mg/dL   Creatinine, Ser 1.33 (H) 0.61 - 1.24 mg/dL   Calcium 9.1 8.9 - 10.3 mg/dL   Total Protein 7.2 6.5 - 8.1 g/dL   Albumin 3.9 3.5 - 5.0 g/dL   AST 19 15 - 41 U/L   ALT 11 (L) 17 - 63 U/L   Alkaline Phosphatase 76 38 - 126 U/L   Total Bilirubin 0.6 0.3 - 1.2  mg/dL   GFR calc non Af Amer 57 (L) >60 mL/min   GFR calc Af Amer >60 >60 mL/min    Comment: (NOTE) The eGFR has been calculated using the CKD EPI equation. This calculation has not been validated in all clinical situations. eGFR's persistently <60 mL/min signify possible Chronic Kidney Disease.    Anion gap 7 5 - 15  I-stat troponin, ED     Status: None   Collection Time: 04/25/16 12:12 AM  Result Value Ref Range   Troponin i, poc 0.03 0.00 - 0.08 ng/mL   Comment 3            Comment: Due to the release kinetics of cTnI, a negative result within the first hours of the onset of symptoms does not rule out myocardial infarction with certainty. If myocardial infarction is still suspected, repeat the test at appropriate intervals.   I-Stat Chem 8, ED     Status: Abnormal   Collection Time: 04/25/16 12:14 AM  Result Value Ref Range   Sodium 136 135 - 145 mmol/L   Potassium 3.8 3.5 - 5.1 mmol/L   Chloride 100 (L) 101 - 111 mmol/L   BUN 24 (H) 6 - 20 mg/dL   Creatinine, Ser 1.30 (H) 0.61 - 1.24 mg/dL   Glucose, Bld 91 65 - 99 mg/dL   Calcium, Ion 1.10 (L) 1.15 - 1.40 mmol/L   TCO2 26 0 - 100 mmol/L   Hemoglobin 11.9 (L) 13.0 - 17.0 g/dL   HCT 35.0 (L) 39.0 - 52.0 %  Brain natriuretic peptide     Status: Abnormal   Collection Time: 04/25/16  1:11 AM  Result Value Ref Range   B Natriuretic Peptide 503.3 (H) 0.0 - 100.0 pg/mL  CBG monitoring, ED     Status: None   Collection Time: 04/25/16  1:37 AM  Result Value Ref Range   Glucose-Capillary 97 65 - 99 mg/dL  Hemoglobin A1c     Status: Abnormal   Collection Time: 04/25/16  2:40 AM  Result Value Ref Range   Hgb A1c MFr Bld 10.3 (H) 4.8 - 5.6 %    Comment: (NOTE)         Pre-diabetes: 5.7 - 6.4  Diabetes: >6.4         Glycemic control for adults with diabetes: <7.0    Mean Plasma Glucose 249 mg/dL    Comment: (NOTE) Performed At: [facility] [place], [place] [id number] Lindon Romp MD  PV:[id number]   Lipid panel     Status: None   Collection Time: 04/25/16  2:40 AM  Result Value Ref Range   Cholesterol 134 0 - 200 mg/dL   Triglycerides 56 <150 mg/dL   HDL 41 >40 mg/dL   Total CHOL/HDL Ratio 3.3 RATIO   VLDL 11 0 - 40 mg/dL   LDL Cholesterol 82 0 - 99 mg/dL    Comment:        Total Cholesterol/HDL:CHD Risk Coronary Heart Disease Risk Table                     Men   Women  1/2 Average Risk   3.4   3.3  Average Risk       5.0   4.4  2 X Average Risk   9.6   7.1  3 X Average Risk  23.4   11.0        Use the calculated Patient Ratio above and the CHD Risk Table to determine the patient's CHD Risk.        ATP III CLASSIFICATION (LDL):  <100     mg/dL   Optimal  100-129  mg/dL   Near or Above                    Optimal  130-159  mg/dL   Borderline  160-189  mg/dL   High  >190     mg/dL   Very High   TSH     Status: Abnormal   Collection Time: 04/25/16  2:40 AM  Result Value Ref Range   TSH 0.205 (L) 0.350 - 4.500 uIU/mL    Comment: Performed by a 3rd Generation assay with a functional sensitivity of <=0.01 uIU/mL.  T4, free     Status: Abnormal   Collection Time: 04/25/16  9:16 AM  Result Value Ref Range   Free T4 2.10 (H) 0.61 - 1.12 ng/dL    Comment: (NOTE) Biotin ingestion may interfere with free T4 tests. If the results are inconsistent with the TSH level, previous test results, or the clinical presentation, then consider biotin interference. If needed, order repeat testing after stopping biotin.   CBG monitoring, ED     Status: Abnormal   Collection Time: 04/25/16 11:57 AM  Result Value Ref Range   Glucose-Capillary 204 (H) 65 - 99 mg/dL  CBG monitoring, ED     Status: Abnormal   Collection Time: 04/25/16  1:08 PM  Result Value Ref Range   Glucose-Capillary 192 (H) 65 - 99 mg/dL   Comment 1 Notify RN    Comment 2 Document in Chart   Glucose, capillary     Status: Abnormal   Collection Time: 04/25/16  4:31 PM  Result Value Ref Range    Glucose-Capillary 268 (H) 65 - 99 mg/dL  Glucose, capillary     Status: Abnormal   Collection Time: 04/25/16  9:39 PM  Result Value Ref Range   Glucose-Capillary 329 (H) 65 - 99 mg/dL   Comment 1 Notify RN    Comment 2 Document in Chart   CBC     Status: Abnormal   Collection Time: 04/26/16  5:24 AM  Result Value Ref Range  WBC 4.6 4.0 - 10.5 K/uL   RBC 4.08 (L) 4.22 - 5.81 MIL/uL   Hemoglobin 12.7 (L) 13.0 - 17.0 g/dL   HCT 36.7 (L) 39.0 - 52.0 %   MCV 90.0 78.0 - 100.0 fL   MCH 31.1 26.0 - 34.0 pg   MCHC 34.6 30.0 - 36.0 g/dL   RDW 12.3 11.5 - 15.5 %   Platelets 161 150 - 400 K/uL  Comprehensive metabolic panel     Status: Abnormal   Collection Time: 04/26/16  5:24 AM  Result Value Ref Range   Sodium 135 135 - 145 mmol/L   Potassium 3.8 3.5 - 5.1 mmol/L   Chloride 101 101 - 111 mmol/L   CO2 26 22 - 32 mmol/L   Glucose, Bld 283 (H) 65 - 99 mg/dL   BUN 19 6 - 20 mg/dL   Creatinine, Ser 1.45 (H) 0.61 - 1.24 mg/dL   Calcium 9.0 8.9 - 10.3 mg/dL   Total Protein 6.8 6.5 - 8.1 g/dL   Albumin 3.3 (L) 3.5 - 5.0 g/dL   AST 17 15 - 41 U/L   ALT 12 (L) 17 - 63 U/L   Alkaline Phosphatase 68 38 - 126 U/L   Total Bilirubin 0.7 0.3 - 1.2 mg/dL   GFR calc non Af Amer 51 (L) >60 mL/min   GFR calc Af Amer 59 (L) >60 mL/min    Comment: (NOTE) The eGFR has been calculated using the CKD EPI equation. This calculation has not been validated in all clinical situations. eGFR's persistently <60 mL/min signify possible Chronic Kidney Disease.    Anion gap 8 5 - 15  Glucose, capillary     Status: Abnormal   Collection Time: 04/26/16  7:03 AM  Result Value Ref Range   Glucose-Capillary 233 (H) 65 - 99 mg/dL   Comment 1 Notify RN    Comment 2 Document in Chart      Lipid Panel     Component Value Date/Time   CHOL 134 04/25/2016 0240   TRIG 56 04/25/2016 0240   HDL 41 04/25/2016 0240   CHOLHDL 3.3 04/25/2016 0240   VLDL 11 04/25/2016 0240   LDLCALC 82 04/25/2016 0240   LDLDIRECT  176.0 03/05/2010 1200     Lab Results  Component Value Date   HGBA1C 10.3 (H) 04/25/2016   HGBA1C 11.8 (H) 06/27/2015        HPI :*  61 y.o.malewith a history of stroke, coronary artery disease, hypertension, hyperlipidemia, diabetes mellitus and HIV infection, presenting following acute onset of tingling and weakness involving right side of his face as well as slurred speech. He also noticed slight tingling in both hands. Facial droop subsequently resolved, as did tingling. Slurred speech improved, but did not completely resolve. He's been taking aspirin and Plavix daily. CT scan of his head showed no acute intracranial abnormality. NIH stroke score at the time of this evaluation was 1 for persistent slurred speech  HOSPITAL COURSE:   TIA 1. HgbA1c 10.3, fasting lipid panel-LDL 82 2. MRI, MRA of the brain without contrast-within normal limits, except Wernicke's encephalopathy and advanced atrophy with multiple old lacunar infarcts 3. PT consult, OT consult, Speech consult-pending 4. Echocardiogram-pending 5. Carotid dopplers-1-39% right internal carotid artery stenosis and low range 40-59% left internal carotid artery stenosis. 6. Prophylactic therapy-Antiplatelet med: Aspirin and Plavix 7. Risk factor modification 8. Telemetry monitoring-normal sinus rhythm Patient would need TEE and loop recorder as outpatient per neurology Echo results discussed with patient prior to dc  HIV: CD4 410 and VL 950 on 06/27/15. -continue home meds: Prezista, Truvada, Norvir  Hypothyroidism: -Continue home Synthroid TSH 0.2, free T4 2.10  TCC:EQFD LDL was not our record -Continue home medications: Lipitor LDL 82  CAD: s/p of CABG and stent. No CP. Cardiac cath 03/19/15 with PCI -continue aspirin/Plavix, Lipitor, imdur, metoprolol -When necessary nitroglycerin    DM-II:Last A1c 11.8 on 06/27/15, now 10.5.  Patient is takingNovoLog and Lantusat home Resume home  regimen  GERD: -Protonix  CKD-II: stable. Baseline creatinine 1.3-1.4, his creatinine is 1.30 on admission. -Follow-up renal function by BMP  Hypertension: -continue metoprolol  Chronic systolic congestive heart failure: 2-D echo 04/19/15 showed EF 25%. Patient not taking diuretics at home. No leg edema or JVD. CHF is compensated. -Continue aspirin and metoprolol BNP 500  Depression:Stable, no suicidal or homicidal ideations. -Continue home medications: Celexa   Discharge Exam:  Blood pressure (!) 153/91, pulse 62, temperature 98.2 F (36.8 C), temperature source Oral, resp. rate 18, height 5' 8"  (1.727 m), weight 71.7 kg (158 lb 1.1 oz), SpO2 96 %.      Follow-up Information    Arnoldo Morale, MD. Schedule an appointment as soon as possible for a visit in 2 day(s).   Specialty:  Family Medicine Why:  Hospital follow-up Contact information: [place] [address] [phone]           Signed: Reyne Dumas 04/26/2016, 9:35 AM        Time spent >45 mins

## 2016-04-26 NOTE — Progress Notes (Signed)
   04/26/16 1100  OT Visit Information  Last OT Received On 04/26/16  Reason Eval/Treat Not Completed OT screened, no needs identified, will sign off  History of Present Illness 60 y.o. male with medical history significant of stroke, hypertension, hyperlipidemia, diabetes mellitus, GERD, hypothyroidism, CAD, s/p of Age and a stent placement, Kaposi's sarcoma, HIV disease CD4 410 on 06/27/15), duodenal ulcer, sCHF, CKD-II, s/p of loop recorder implant (Medtronic Reveal Model # [id number] MR safe), who presents with a slurred speech, right facial droop and numbness. MRI brain negative for acute changes    Per PT and conversation with pt, pt is at baseline with ADLs. No OT needs identified. OT signing off.  Tyrone Schimke OTR/L Pager: [phone]

## 2016-04-29 ENCOUNTER — Telehealth: Payer: Self-pay

## 2016-04-29 NOTE — Telephone Encounter (Signed)
Transitional Care Clinic Post-discharge Follow-Up Phone Call:  Date of Discharge: 04/26/2016 Principal Discharge Diagnosis(es): stroke, HIV, DM, CAD Post-discharge Communication: (Clearly document all attempts clearly and date contact made) Call placed to the patient Call Completed: Yes                    With Whom: Patient Interpreter Needed:No     Please check all that apply:  X  Patient is knowledgeable of his/her condition(s) and/or treatment. X  Patient is caring for self at home.  - Has assistance from his partner if needed ? Patient is receiving assist at home from family and/or caregiver. Family and/or caregiver is knowledgeable of patient's condition(s) and/or treatment. ? Patient is receiving home health services. If so, name of agency.     Medication Reconciliation:  ? Medication list reviewed with patient. X Patient obtained all discharge medications. If not, why? He said that he has all of his medications and is taking them as ordered. No new medications ordered at discharge. He inquired about lancets for his glucometer. Informed him that lancets were e-prescribed to [facility] on 04/26/16. He stated that he did not need to review his medications and he would be in the clinic tomorrow,  Reminded him to bring all of his medications with him to the appointment and he said that he would.  He noted that he has been checking his blood sugar as ordered and reported that his blood sugar this morning was 162.    Activities of Daily Living:  X Independent ? Needs assist (describe; ? home DME used) ? [facility] (describe, ? home DME used)   Community resources in place for patient:  X  None  ? Home Health/Home DME ? Assisted Living ? Support Group              Questions/Concerns discussed: He said that he was feeling " fine" and sounded to be in a hurry to get off of the phone. He confirmed his appointment for tomorrow - 04/30/16 @ 1000 at [facility] and stated that he  would be driving to the clinic and a friend would be accompanying him.  No other questions/concerns reported at this time.

## 2016-04-30 ENCOUNTER — Ambulatory Visit: Payer: Medicaid Other | Attending: Family Medicine | Admitting: Family Medicine

## 2016-04-30 ENCOUNTER — Encounter: Payer: Self-pay | Admitting: Family Medicine

## 2016-04-30 VITALS — BP 134/78 | HR 69 | Temp 98.2°F | Ht 68.5 in | Wt 162.2 lb

## 2016-04-30 DIAGNOSIS — E1165 Type 2 diabetes mellitus with hyperglycemia: Secondary | ICD-10-CM | POA: Insufficient documentation

## 2016-04-30 DIAGNOSIS — B20 Human immunodeficiency virus [HIV] disease: Secondary | ICD-10-CM | POA: Diagnosis not present

## 2016-04-30 DIAGNOSIS — I63312 Cerebral infarction due to thrombosis of left middle cerebral artery: Secondary | ICD-10-CM

## 2016-04-30 DIAGNOSIS — Z794 Long term (current) use of insulin: Secondary | ICD-10-CM | POA: Insufficient documentation

## 2016-04-30 DIAGNOSIS — E039 Hypothyroidism, unspecified: Secondary | ICD-10-CM | POA: Diagnosis not present

## 2016-04-30 DIAGNOSIS — I2510 Atherosclerotic heart disease of native coronary artery without angina pectoris: Secondary | ICD-10-CM | POA: Insufficient documentation

## 2016-04-30 DIAGNOSIS — Z951 Presence of aortocoronary bypass graft: Secondary | ICD-10-CM

## 2016-04-30 DIAGNOSIS — Z79899 Other long term (current) drug therapy: Secondary | ICD-10-CM | POA: Insufficient documentation

## 2016-04-30 DIAGNOSIS — I255 Ischemic cardiomyopathy: Secondary | ICD-10-CM | POA: Insufficient documentation

## 2016-04-30 DIAGNOSIS — I5022 Chronic systolic (congestive) heart failure: Secondary | ICD-10-CM | POA: Diagnosis not present

## 2016-04-30 DIAGNOSIS — Z9889 Other specified postprocedural states: Secondary | ICD-10-CM | POA: Insufficient documentation

## 2016-04-30 DIAGNOSIS — Z7982 Long term (current) use of aspirin: Secondary | ICD-10-CM | POA: Diagnosis not present

## 2016-04-30 DIAGNOSIS — I110 Hypertensive heart disease with heart failure: Secondary | ICD-10-CM | POA: Insufficient documentation

## 2016-04-30 DIAGNOSIS — G8311 Monoplegia of lower limb affecting right dominant side: Secondary | ICD-10-CM | POA: Insufficient documentation

## 2016-04-30 DIAGNOSIS — Z634 Disappearance and death of family member: Secondary | ICD-10-CM

## 2016-04-30 DIAGNOSIS — Z7902 Long term (current) use of antithrombotics/antiplatelets: Secondary | ICD-10-CM | POA: Diagnosis not present

## 2016-04-30 DIAGNOSIS — R471 Dysarthria and anarthria: Secondary | ICD-10-CM | POA: Insufficient documentation

## 2016-04-30 DIAGNOSIS — IMO0002 Reserved for concepts with insufficient information to code with codable children: Secondary | ICD-10-CM

## 2016-04-30 DIAGNOSIS — Z8673 Personal history of transient ischemic attack (TIA), and cerebral infarction without residual deficits: Secondary | ICD-10-CM | POA: Diagnosis not present

## 2016-04-30 DIAGNOSIS — Z955 Presence of coronary angioplasty implant and graft: Secondary | ICD-10-CM | POA: Insufficient documentation

## 2016-04-30 LAB — GLUCOSE, POCT (MANUAL RESULT ENTRY): POC GLUCOSE: 230 mg/dL — AB (ref 70–99)

## 2016-04-30 MED ORDER — CITALOPRAM HYDROBROMIDE 20 MG PO TABS: 20.0000 mg | ORAL_TABLET | Freq: Every day | ORAL | 5 refills | Status: AC

## 2016-04-30 NOTE — Progress Notes (Signed)
3 deaths in the past year- pt depressed

## 2016-04-30 NOTE — Progress Notes (Signed)
Transitional care clinic  Date of telephone encounter: 02/28/16  Hospitalization dates: 04/24/16 through 04/26/16  Subjective:  Patient ID: Jackson Shaw, male    DOB: 02/28/1956  Age: 60 y.o. MRN: [id number]  CC: Hospitalization Follow-up (stroke) and Diabetes   HPI Jackson Shaw is a 60 year old male with a history of hypertension, type 2 diabetes mellitus (A1c 10.3), HIV (CD4 count of 410 and viral load of 950 in 05/2015; on antiretroviral therapy), coronary artery disease status post stent placement, previous CABG x5, ischemic cardiomyopathy (EF 25%,), previous history of CVA (with residual mild right-sided weakness with mild dysarthria), hypothyroidism recently hospitalized at [facility] for TIA.   He presented with acute onset of weakness and tingling of the right side of his face, facial droop and symptoms resolved spontaneously -CT head revealed no acute abnormality - MRI, MRA of the brain without contrast revealed [place] his encephalopathy, age advanced atrophia and chronic microvascular ischemia, multiple old left frontal infarct, moderate narrowing of distal cervical and proximal petrous segment of left internal carotid artery. -Carotid Dopplers were negative for carotid artery stenosis - 2-D echo revealed EF of 20-25%, diffuse hypokinesis, grade 1 diastolic dysfunction  He was maintained on aspirin and Plavix which he had been taking prior to hospitalization and subsequently discharged with recommendations to follow-up with cardiology for TEE and placement of a loop recorder.  He does not have any acute concerns today and denies chest pain, shortness of breath. He has upcoming appointments with his specialists at [facility] - infectious disease and endocrinology. He recently lost his mom and 2 other family members.  Past Medical History:  Diagnosis Date  . Anginal pain ([place])   . Anxiety   . Aspiration pneumonia ([place]) 06/27/2015  . Chronic systolic CHF  (congestive heart failure) ([place])    a. 12/2013 Echo: EF 45%;  b. 03/2015 Echo: EF 35%, mild MR, mildly dil LA.  Marland Kitchen Coronary artery disease    a. s/p prior CABG;  b. 03/2015 NSTEMI/PCI: 100ost CTO, LAD 100ost/p, 45d, D1 100/95ost, RI 75, LCX 100p, OM1/3 small, RCA 100ost->distal, RPDA/RPAV small,RPA1 80, RPA2 90, RPLB 80, RPLB2 80,  VG->D1->RI 10p, patent stents, VG->RPDA->RPLB3 99 before RPDA (3.5x24 Promus DES), 100 between PDA& RPLB3 (CTO), LIMA->LAD nl.  . Diabetic neuropathy ([place]) 12/22/2014  . GERD (gastroesophageal reflux disease)   . History of duodenal ulcer   . History of hiatal hernia   . HIV (human immunodeficiency virus infection) ([place]) a. Dx in 1980's.  Marland Kitchen Hyperlipidemia   . Hypertension   . Hypothyroidism   . 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 Kidney stones   . MI (myocardial infarction)    "I've had 4" (06/27/2015)  . NSTEMI (non-ST elevated myocardial infarction) ([place]) 03/2015  . Pneumonia "several times"  . Stroke [facility])    a. 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  Marland Kitchen Syncope and collapse    a. 12/2013 s/p MDT Linq ILR.  Marland Kitchen Type II diabetes mellitus ([place])     Past Surgical History:  Procedure Laterality Date  . CARDIAC CATHETERIZATION N/A 04/19/2015   Procedure: Left Heart Cath and Coronary Angiography;  Surgeon: Leonie Man, MD;  Location: [place] [facility];  Service: Cardiovascular;  Laterality: N/A;  . CARDIAC CATHETERIZATION N/A 04/19/2015   Procedure: Coronary Stent Intervention;  Surgeon: Leonie Man, MD;  Location: [place] [facility];  Service: Cardiovascular;  Laterality: N/A;  . CORONARY ANGIOPLASTY WITH STENT PLACEMENT  "  3-4 times"   "I've got several stents"  . CORONARY ARTERY BYPASS GRAFT  1980s   "CABG X5; Dr. Lia Foyer"  . CYSTOSCOPY W/ STONE MANIPULATION  1980s  . LOOP RECORDER IMPLANT     Medtronic Reveal Model # [id number] MR safe    Allergies  Allergen Reactions  . Penicillins Anaphylaxis  . Codeine Nausea Only      Outpatient Medications Prior to Visit  Medication Sig Dispense Refill  . aspirin EC 81 MG tablet Take 81 mg by mouth daily.     Marland Kitchen atorvastatin (LIPITOR) 20 MG tablet Take 1 tablet (20 mg total) by mouth daily. 30 tablet 11  . clopidogrel (PLAVIX) 75 MG tablet Take 1 tablet (75 mg total) by mouth daily. 30 tablet 11  . Darunavir Ethanolate (PREZISTA) 800 MG tablet Take 800 mg by mouth daily.     Marland Kitchen emtricitabine-tenofovir (TRUVADA) 200-300 MG per tablet Take 1 tablet by mouth daily.    . insulin aspart (NOVOLOG) 100 UNIT/ML injection Inject 6 Units into the skin 3 (three) times daily with meals. 6 units is the base. Add 2 units for each 15gm of carbs    . Insulin Glargine (LANTUS SOLOSTAR) 100 UNIT/ML Solostar Pen Inject 20 Units into the skin daily at 10 pm. 5 pen 2  . Insulin Pen Needle 31G X 5 MM MISC 1 each by Does not apply route at bedtime. 30 each 5  . isosorbide mononitrate (IMDUR) 30 MG 24 hr tablet Take 1 tablet (30 mg total) by mouth daily. 30 tablet 11  . Lancets (FREESTYLE) lancets Use as instructed TID 100 each 12  . levothyroxine (SYNTHROID, LEVOTHROID) 75 MCG tablet Take 4 tablets (300 mcg total) by mouth daily before breakfast. Must have office visit for refills 120 tablet 0  . metoprolol succinate (TOPROL XL) 25 MG 24 hr tablet Take 1 tablet (25 mg total) by mouth daily. 30 tablet 11  . nitroGLYCERIN (NITROSTAT) 0.4 MG SL tablet Place 1 tablet (0.4 mg total) under the tongue every 5 (five) minutes as needed for chest pain. 25 tablet 3  . pantoprazole (PROTONIX) 40 MG tablet Take 40 mg by mouth daily.     . ritonavir (NORVIR) 100 MG capsule Take 100 mg by mouth daily.     . citalopram (CELEXA) 10 MG tablet Take 10 mg by mouth daily.     Marland Kitchen albuterol (PROVENTIL HFA;VENTOLIN HFA) 108 (90 Base) MCG/ACT inhaler Inhale 2 puffs into the lungs every 6 (six) hours as needed for wheezing or shortness of breath. (Patient not taking: Reported on 04/30/2016) 1 Inhaler 0  . benzonatate  (TESSALON) 100 MG capsule Take 1 capsule (100 mg total) by mouth every 8 (eight) hours. (Patient not taking: Reported on 04/30/2016) 21 capsule 0  . feeding supplement, ENSURE ENLIVE, (ENSURE ENLIVE) LIQD Take 237 mLs by mouth 2 (two) times daily between meals. (Patient not taking: Reported on 04/30/2016) 237 mL 12  . gabapentin (NEURONTIN) 300 MG capsule One capsule at night for 2 weeks, then take one capsule twice a day (Patient not taking: Reported on 04/30/2016) 60 capsule 3  . doxycycline (VIBRAMYCIN) 100 MG capsule Take 1 capsule (100 mg total) by mouth 2 (two) times daily. (Patient not taking: Reported on 04/25/2016) 10 capsule 0   No facility-administered medications prior to visit.     ROS Review of Systems  Constitutional: Negative for activity change and appetite change.  HENT: Negative for sinus pressure and sore throat.   Eyes: Negative for visual  disturbance.  Respiratory: Negative for cough, chest tightness and shortness of breath.   Cardiovascular: Negative for chest pain and leg swelling.  Gastrointestinal: Negative for abdominal distention, abdominal pain, constipation and diarrhea.  Endocrine: Negative.   Genitourinary: Negative for dysuria.  Musculoskeletal: Negative for joint swelling and myalgias.  Skin: Negative for rash.  Allergic/Immunologic: Negative.   Neurological: Negative for weakness, light-headedness and numbness.  Psychiatric/Behavioral: Negative for dysphoric mood and suicidal ideas.    Objective:  BP 134/78 (BP Location: Right Arm, Patient Position: Sitting, Cuff Size: Large)   Pulse 69   Temp 98.2 F (36.8 C) (Oral)   Ht 5' 8.5" (1.74 m)   Wt 162 lb 3.2 oz (73.6 kg)   SpO2 100%   BMI 24.30 kg/m   BP/Weight 04/30/2016 04/26/2016 0000000  Systolic BP Q000111Q Q000111Q -  Diastolic BP 78 88 -  Wt. (Lbs) 162.2 - 158.07  BMI 24.3 - 24.03     Physical Exam  Constitutional: He is oriented to person, place, and time. He appears well-developed and  well-nourished.  Neck: No JVD present.  Cardiovascular: Normal rate, normal heart sounds and intact distal pulses.   No murmur heard. Pulmonary/Chest: Effort normal and breath sounds normal. He has no wheezes. He has no rales. He exhibits no tenderness.  Abdominal: Soft. Bowel sounds are normal. He exhibits no distension and no mass. There is no tenderness.  Musculoskeletal: Normal range of motion.  Neurological: He is alert and oriented to person, place, and time.  Skin: Skin is warm and dry.  Psychiatric: He has a normal mood and affect.     Lab Results  Component Value Date   HGBA1C 10.3 (H) 04/25/2016    Assessment & Plan:   1. Insulin dependent type 2 diabetes mellitus, uncontrolled ([place]) Uncontrolled with A1c of 10.3 Continue Lantus Currently managed by endocrine at [facility] - Glucose (CBG)  2. S/P CABG x 5 Stable Risk factor modification  3. Hypothyroidism, unspecified type Uncontrolled with last TSH suppressed at 0.205 Continue current dose of levothyroxine Keep appointment with endocrine  4. Cerebrovascular accident (CVA) due to thrombosis of left middle cerebral artery ([place]) With residual deficits-dysarthria and mild right leg weakness Completed PT Pending appointment with cardiology (for TEE and loop recorder) - he has been advised to call to schedule an appointment  5. Bereavement I will increase dose of Celexa  6. Chronic systolic CHF (congestive heart failure) (HCC) EF of 20-25% from 2-D echo of 04/2016 Discussed diagnosis with the patient including limiting fluid intake to less than 2 L a day, daily weight checks He is euvolemic at this time  7. HIV Continue antiretrovirals therapy  Meds ordered this encounter  Medications  . citalopram (CELEXA) 20 MG tablet    Sig: Take 1 tablet (20 mg total) by mouth daily.    Dispense:  30 tablet    Refill:  5    Discontinue previous dose    Follow-up: Return in about 3 weeks (around 05/21/2016) for  TCC : coordination of care.   Arnoldo Morale MD

## 2016-04-30 NOTE — Patient Instructions (Signed)
Heart Failure °Heart failure is a condition in which the heart has trouble pumping blood because it has become weak or stiff. This means that the heart does not pump blood efficiently for the body to work well. For some people with heart failure, fluid may back up into the lungs and there may be swelling (edema) in the lower legs. Heart failure is usually a long-term (chronic) condition. It is important for you to take good care of yourself and follow the treatment plan from your health care provider. °What are the causes? °This condition is caused by some health problems, including: °· High blood pressure (hypertension). Hypertension causes the heart muscle to work harder than normal. High blood pressure eventually causes the heart to become stiff and weak. °· Coronary artery disease (CAD). CAD is the buildup of cholesterol and fat (plaques) in the arteries of the heart. °· Heart attack (myocardial infarction). Injured tissue, which is caused by the heart attack, does not contract as well and the heart's ability to pump blood is weakened. °· Abnormal heart valves. When the heart valves do not open and close properly, the heart muscle must pump harder to keep the blood flowing. °· Heart muscle disease (cardiomyopathy or myocarditis). Heart muscle disease is damage to the heart muscle from a variety of causes, such as drug or alcohol abuse, infections, or unknown causes. These can increase the risk of heart failure. °· Lung disease. When the lungs do not work properly, the heart must work harder. ° °What increases the risk? °Risk of heart failure increases as a person ages. This condition is also more likely to develop in people who: °· Are overweight. °· Are male. °· Smoke or chew tobacco. °· Abuse alcohol or illegal drugs. °· Have taken medicines that can damage the heart, such as chemotherapy drugs. °· Have diabetes. °? High blood sugar (glucose) is associated with high fat (lipid) levels in the blood. °? Diabetes  can also damage tiny blood vessels that carry nutrients to the heart muscle. °· Have abnormal heart rhythms. °· Have thyroid problems. °· Have low blood counts (anemia). ° °What are the signs or symptoms? °Symptoms of this condition include: °· Shortness of breath with activity, such as when climbing stairs. °· Persistent cough. °· Swelling of the feet, ankles, legs, or abdomen. °· Unexplained weight gain. °· Difficulty breathing when lying flat (orthopnea). °· Waking from sleep because of the need to sit up and get more air. °· Rapid heartbeat. °· Fatigue and loss of energy. °· Feeling light-headed, dizzy, or close to fainting. °· Loss of appetite. °· Nausea. °· Increased urination during the night (nocturia). °· Confusion. ° °How is this diagnosed? °This condition is diagnosed based on: °· Medical history, symptoms, and a physical exam. °· Diagnostic tests, which may include: °? Echocardiogram. °? Electrocardiogram (ECG). °? Chest X-ray. °? Blood tests. °? Exercise stress test. °? Radionuclide scans. °? Cardiac catheterization and angiogram. ° °How is this treated? °Treatment for this condition is aimed at managing the symptoms of heart failure. Medicines, behavioral changes, or other treatments may be necessary to treat heart failure. °Medicines °These may include: °· Angiotensin-converting enzyme (ACE) inhibitors. This type of medicine blocks the effects of a blood protein called angiotensin-converting enzyme. ACE inhibitors relax (dilate) the blood vessels and help to lower blood pressure. °· Angiotensin receptor blockers (ARBs). This type of medicine blocks the actions of a blood protein called angiotensin. ARBs dilate the blood vessels and help to lower blood pressure. °· Water   pills (diuretics). Diuretics cause the kidneys to remove salt and water from the blood. The extra fluid is removed through urination, leaving a lower volume of blood that the heart has to pump. °· Beta blockers. These improve heart  muscle strength and they prevent the heart from beating too quickly. °· Digoxin. This increases the force of the heartbeat. ° °Healthy behavior changes °These may include: °· Reaching and maintaining a healthy weight. °· Stopping smoking or chewing tobacco. °· Eating heart-healthy foods. °· Limiting or avoiding alcohol. °· Stopping use of street drugs (illegal drugs). °· Physical activity. ° °Other treatments °These may include: °· Surgery to open blocked coronary arteries or repair damaged heart valves. °· Placement of a biventricular pacemaker to improve heart muscle function (cardiac resynchronization therapy). This device paces both the right ventricle and left ventricle. °· Placement of a device to treat serious abnormal heart rhythms (implantable cardioverter defibrillator, or ICD). °· Placement of a device to improve the pumping ability of the heart (left ventricular assist device, or LVAD). °· Heart transplant. This can cure heart failure, and it is considered for certain patients who do not improve with other therapies. ° °Follow these instructions at home: °Medicines °· Take over-the-counter and prescription medicines only as told by your health care provider. Medicines are important in reducing the workload of your heart, slowing the progression of heart failure, and improving your symptoms. °? Do not stop taking your medicine unless your health care provider told you to do that. °? Do not skip any dose of medicine. °? Refill your prescriptions before you run out of medicine. You need your medicines every day. °Eating and drinking ° °· Eat heart-healthy foods. Talk with a dietitian to make an eating plan that is right for you. °? Choose foods that contain no trans fat and are low in saturated fat and cholesterol. Healthy choices include fresh or frozen fruits and vegetables, fish, lean meats, legumes, fat-free or low-fat dairy products, and whole-grain or high-fiber foods. °? Limit salt (sodium) if  directed by your health care provider. Sodium restriction may reduce symptoms of heart failure. Ask a dietitian to recommend heart-healthy seasonings. °? Use healthy cooking methods instead of frying. Healthy methods include roasting, grilling, broiling, baking, poaching, steaming, and stir-frying. °· Limit your fluid intake if directed by your health care provider. Fluid restriction may reduce symptoms of heart failure. °Lifestyle °· Stop smoking or using chewing tobacco. Nicotine and tobacco can damage your heart and your blood vessels. Do not use nicotine gum or patches before talking to your health care provider. °· Limit alcohol intake to no more than 1 drink per day for non-pregnant women and 2 drinks per day for men. One drink equals 12 oz of beer, 5 oz of wine, or 1½ oz of hard liquor. °? Drinking more than that is harmful to your heart. Tell your health care provider if you drink alcohol several times a week. °? Talk with your health care provider about whether any level of alcohol use is safe for you. °? If your heart has already been damaged by alcohol or you have severe heart failure, drinking alcohol should be stopped completely. °· Stop use of illegal drugs. °· Lose weight if directed by your health care provider. Weight loss may reduce symptoms of heart failure. °· Do moderate physical activity if directed by your health care provider. People who are elderly and people with severe heart failure should consult with a health care provider for physical activity recommendations. °  Monitor important information °· Weigh yourself every day. Keeping track of your weight daily helps you to notice excess fluid sooner. °? Weigh yourself every morning after you urinate and before you eat breakfast. °? Wear the same amount of clothing each time you weigh yourself. °? Record your daily weight. Provide your health care provider with your weight record. °· Monitor and record your blood pressure as told by your health  care provider. °· Check your pulse as told by your health care provider. °Dealing with extreme temperatures °· If the weather is extremely hot: °? Avoid vigorous physical activity. °? Use air conditioning or fans or seek a cooler location. °? Avoid caffeine and alcohol. °? Wear loose-fitting, lightweight, and light-colored clothing. °· If the weather is extremely cold: °? Avoid vigorous physical activity. °? Layer your clothes. °? Wear mittens or gloves, a hat, and a scarf when you go outside. °? Avoid alcohol. °General instructions °· Manage other health conditions such as hypertension, diabetes, thyroid disease, or abnormal heart rhythms as told by your health care provider. °· Learn to manage stress. If you need help to do this, ask your health care provider. °· Plan rest periods when fatigued. °· Get ongoing education and support as needed. °· Participate in or seek rehabilitation as needed to maintain or improve independence and quality of life. °· Stay up to date with immunizations. Keeping current on pneumococcal and influenza immunizations is especially important to prevent respiratory infections. °· Keep all follow-up visits as told by your health care provider. This is important. °Contact a health care provider if: °· You have a rapid weight gain. °· You have increasing shortness of breath that is unusual for you. °· You are unable to participate in your usual physical activities. °· You tire easily. °· You cough more than normal, especially with physical activity. °· You have any swelling or more swelling in areas such as your hands, feet, ankles, or abdomen. °· You are unable to sleep because it is hard to breathe. °· You feel like your heart is beating quickly (palpitations). °· You become dizzy or light-headed when you stand up. °Get help right away if: °· You have difficulty breathing. °· You notice or your family notices a change in your awareness, such as having trouble staying awake or having  difficulty with concentration. °· You have pain or discomfort in your chest. °· You have an episode of fainting (syncope). °This information is not intended to replace advice given to you by your health care provider. Make sure you discuss any questions you have with your health care provider. °Document Released: 05/13/2005 Document Revised: 01/16/2016 Document Reviewed: 12/06/2015 °Elsevier Interactive Patient Education © 2017 [employer] ° °

## 2016-05-13 ENCOUNTER — Telehealth: Payer: Self-pay

## 2016-05-13 NOTE — Telephone Encounter (Signed)
DMV Handicap placard application given to Dr Jarold Song for signature.

## 2016-05-13 NOTE — Telephone Encounter (Signed)
Call placed to the patient to check on his status and to discuss scheduling a follow up appointment at the [facility] )TCC). The patient stated that he is doing " fine."  He noted that he continues to check his blood sugars as instructed and stated that they have been " good but not great." He reported that this morning his blood sugar was 192. He also reported that he checks his weight every other day and his weight today was 162 lbs and he has maintained his weight at 160-162. He said that he can tell his breathing is much better when his weight is around his current weight. He also confirmed that he is trying to adhere to the 2L/day fluid restriction.  He was agreeable to scheduling a follow up appointment at the [facility] and an appointment was scheduled for 05/24/16 @ 0945. The patient was very appreciative of the appointment.  He inquired about a handicapped placard for his card. Informed him that the application can be picked up at the office or when he comes to his appointment next week. No other questions /concerns reported at this time.

## 2016-05-16 ENCOUNTER — Telehealth: Payer: Self-pay

## 2016-05-16 NOTE — Telephone Encounter (Signed)
This Case Manager placed call to patient to inform him that application for disability parking placard ready for pick-up. Patient appreciative and indicated he plans to pick-up application when he comes for his appointment next week. Will place form at front desk. Reminded patient of his Transitional Care follow-up appointment on 05/23/16 at 0945. Patient appreciative of reminder. No additional needs identified.

## 2016-05-22 ENCOUNTER — Telehealth: Payer: Self-pay

## 2016-05-22 NOTE — Telephone Encounter (Signed)
Attempted to contact the patient to check on his status and to remind him of his appointment at the [facility] tomorrow - 05/23/16 @ 0945. Call placed to # [phone] and a HIPAA compliant voicemail message was left requesting a call back to # [phone] or [phone].

## 2016-05-23 ENCOUNTER — Inpatient Hospital Stay: Payer: Medicaid Other | Admitting: Family Medicine

## 2016-05-24 ENCOUNTER — Telehealth: Payer: Self-pay

## 2016-05-24 NOTE — Telephone Encounter (Signed)
This Case Manager placed call to patient to discuss rescheduling Transitional Care follow-up appointment as patient missed appointment on 05/23/16. Call placed to [phone]; however, unable to reach. HIPPA compliant voicemail left requesting return call.

## 2016-05-27 DEATH — deceased

## 2016-05-28 ENCOUNTER — Telehealth: Payer: Self-pay

## 2016-05-28 NOTE — Telephone Encounter (Signed)
Attempted to contact the patient to check on his status and to discuss scheduling a follow up appointment with the [facility] at [facility]. Call placed to # [phone] (M) and a HIPAA compliant voicemail message was left requesting a call back to # [phone] or [phone].

## 2016-05-29 ENCOUNTER — Telehealth: Payer: Self-pay

## 2016-05-29 NOTE — Telephone Encounter (Signed)
This Case Manager placed call to patient to check status and to discuss rescheduling [facility] appointment as patient missed his appointment on 05/23/16. Call placed to [phone]; however, unable to reach. HIPPA compliant voicemail left requesting return call.

## 2016-05-30 ENCOUNTER — Encounter: Payer: Self-pay | Admitting: Cardiology

## 2016-05-30 ENCOUNTER — Ambulatory Visit (INDEPENDENT_AMBULATORY_CARE_PROVIDER_SITE_OTHER): Payer: Medicaid Other | Admitting: *Deleted

## 2016-05-30 ENCOUNTER — Ambulatory Visit (INDEPENDENT_AMBULATORY_CARE_PROVIDER_SITE_OTHER): Payer: Medicaid Other | Admitting: Cardiology

## 2016-05-30 VITALS — BP 140/80 | HR 68 | Ht 68.5 in | Wt 161.0 lb

## 2016-05-30 DIAGNOSIS — I2510 Atherosclerotic heart disease of native coronary artery without angina pectoris: Secondary | ICD-10-CM | POA: Diagnosis not present

## 2016-05-30 DIAGNOSIS — Z95818 Presence of other cardiac implants and grafts: Secondary | ICD-10-CM

## 2016-05-30 LAB — CUP PACEART INCLINIC DEVICE CHECK
Implantable Pulse Generator Implant Date: 20150828
MDC IDC SESS DTM: 20180104154246

## 2016-05-30 NOTE — Patient Instructions (Signed)
Medication Instructions:  Your physician recommends that you continue on your current medications as directed. Please refer to the Current Medication list given to you today.   Labwork: NONE  Testing/Procedures: NONE  Follow-Up: 3 MONTHS WITH DR. HILTY  Any Other Special Instructions Will Be Listed Below (If Applicable). PER DEVICE CLINIC WHEN YOU GET YOUR LOOP RECORDER MONITOR YOU WILL NEED TO PLUG THIS IN BY YOUR BEDSIDE    If you need a refill on your cardiac medications before your next appointment, please call your pharmacy.

## 2016-05-30 NOTE — Progress Notes (Signed)
05/30/2016 Jackson Shaw   [birth year]  [id number]  Primary Physician Arnoldo Morale, MD Primary Cardiologist: Dr. 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 This has resulted in persistent right-sided weakness, requiring use of a cane. Most recently with recurrent left sided stroke treated at [facility] from 11/29-12/1/17.   He has not been seen in our office since 06/2015. As outlined above, he has a h/o CAD and prior CAD. He presented to [facility] 04/19/15 with CP c/w unstable angina. He ruled for NSTEMI, eventually peaking his troponin at 38.53. He underwent diagnostic catheterization on 11/23 revealing severe native CAD with patent LIMA  LAD and sequential vein graft D1 Ramus. The sequential vein graft  RPDA  RPLB3 was notable for a 99% stenosis within the graft proximal to the RPDA insertion. Beyond the RPDA, the graft was chronically occluded. The proximal stenosis was felt to be the culprit for current symptoms and was successfully treated using a 3.5 x 24 mm Promus Premier DES. Mr. Laber tolerated the procedure well and post-procedure has had no further chest pain. 2D echocardiography was performed on 11/23 revealing an EF of 25%, which is down from 45% in 2015. He has been maintained on ASA, plavix, bb, nitrate, and statin therapy. He is not on an ACEI/ARB/ARNI/aldosterone blocker secondary to prior intolerance and h/o syncope in the setting of CHF medications. He was discharged on 04/20/15 in stable condition. It appears that he was never seen for post hospital f/u after that hospital stay.  He was readmitted 06/29/15 by IM at [facility]  after presenting with a compliant of dyspnea. He had also noted a recently history of choking while swallowing liquids and foods. He was admitted for acute respiratory distress in the setting of aspiration PNA. He was treated with antibiotics. I saw him for post hospital f/u 06/2015. In regards to his CAD, he noted occasional chest tightness with heavy exertion. He also mentioned occasional resting CP but, short-lived and resolved spontaneously. His EKG, at that time, showed lateral TWIs not present on recent hospital EKG 06/27/15. I ordered for him to get a repeat NST, however patient never returned for f/u.  Most recently, on 04/24/16, he presented to [facility] with acute onset of tingling and weakness involving right side of his face as well as slurred speech. He also noticed slight tingling in both hands. Facial droop subsequently resolved,as did tingling.Slurred speech improved, but did not completely resolve. He's been taking aspirin and Plavix daily. CT scan of his head showed no acute intracranial abnormality. NIH stroke score at the time of this evaluation was 1 for persistent slurred speech. MRI, MRA of the brain without contrast-within normal limits, except Wernicke's encephalopathy and advanced atrophy with multiple old lacunar infarcts. Carotid dopplers-1-39% right internal carotid artery stenosis and low range 40-59% left internal carotid artery stenosis. Telemetry showed NSR w/o evidence of any atrial arrhthymias. TTE showed no cardiac source of emboli. EF was 20-25% with grade 1DD and diffuse hypokinesis. He was continued on Aspirin and Plavix. Neurology recommended outpatient f/u in our office for consideration for TEE and loop recorder. It appears neurology was not aware that the patient  already has a loop. This was not checked during his hospitalization.   He presents to clinic alone today. He reports that he has done fairly well, considering his history. He continues to have residual right sided  weakness and gait instability and requires use of a cane. He denies palpitations. Luckily he has had no recent recurrence of angina and no s/s of acute CHF. No weight gain, edema, dyspnea, PND or orthopnea. He watches what he eats. Low sodium/ low fat. He reports full medication compliance.   Our EP device clinic was able to interrogate his device. It was implanted 12/2013 and still functioning with approximately 8 months of longevity remaining. No arrhthymias have been captured.    Current Meds  Medication Sig  . aspirin EC 81 MG tablet Take 81 mg by mouth daily.   Marland Kitchen atorvastatin (LIPITOR) 20 MG tablet Take 1 tablet (20 mg total) by mouth daily.  . benzonatate (TESSALON) 100 MG capsule Take 1 capsule (100 mg total) by mouth every 8 (eight) hours.  . citalopram (CELEXA) 20 MG tablet Take 1 tablet (20 mg total) by mouth daily.  . clopidogrel (PLAVIX) 75 MG tablet Take 1 tablet (75 mg total) by mouth daily.  . Darunavir Ethanolate (PREZISTA) 800 MG tablet Take 800 mg by mouth daily.   Marland Kitchen emtricitabine-tenofovir (TRUVADA) 200-300 MG per tablet Take 1 tablet by mouth daily.  Marland Kitchen gabapentin (NEURONTIN) 300 MG capsule One capsule at night for 2 weeks, then take one capsule twice a day  . insulin aspart (NOVOLOG) 100 UNIT/ML injection Inject 6 Units into the skin 3 (three) times daily with meals. 6 units is the base. Add 2 units for each 15gm of carbs  . Insulin Glargine (LANTUS SOLOSTAR) 100 UNIT/ML Solostar Pen Inject 20 Units into the skin daily at 10 pm.  . Insulin Pen Needle 31G X 5 MM MISC 1 each by Does not apply route at bedtime.  . isosorbide mononitrate (IMDUR) 30 MG 24 hr tablet Take 1 tablet (30 mg total) by mouth daily.  . Lancets (FREESTYLE) lancets Use as instructed TID  . levothyroxine (SYNTHROID, LEVOTHROID) 75 MCG tablet Take 4 tablets (300 mcg total) by mouth daily before breakfast. Must have office visit for refills  . metoprolol succinate (TOPROL XL) 25 MG 24 hr tablet Take 1 tablet  (25 mg total) by mouth daily.  . nitroGLYCERIN (NITROSTAT) 0.4 MG SL tablet Place 1 tablet (0.4 mg total) under the tongue every 5 (five) minutes as needed for chest pain.  . pantoprazole (PROTONIX) 40 MG tablet Take 40 mg by mouth daily.   . ritonavir (NORVIR) 100 MG capsule Take 100 mg by mouth daily.    Allergies  Allergen Reactions  . Penicillins Anaphylaxis  . Codeine Nausea Only   Past Medical History:  Diagnosis Date  . Anginal pain ([place])   . Anxiety   . Aspiration pneumonia ([place]) 06/27/2015  . Chronic systolic CHF (congestive heart failure) ([place])    a. 12/2013 Echo: EF 45%;  b. 03/2015 Echo: EF 35%, mild MR, mildly dil LA.  Marland Kitchen Coronary artery disease    a. s/p prior CABG;  b. 03/2015 NSTEMI/PCI: 100ost CTO, LAD 100ost/p, 45d, D1 100/95ost, RI 75, LCX 100p, OM1/3 small, RCA 100ost->distal, RPDA/RPAV small,RPA1 80, RPA2 90, RPLB 80, RPLB2 80,  VG->D1->RI 10p, patent stents, VG->RPDA->RPLB3 99 before RPDA (3.5x24 Promus DES), 100 between PDA& RPLB3 (CTO), LIMA->LAD nl.  . Diabetic neuropathy ([place]) 12/22/2014  . GERD (gastroesophageal reflux disease)   . History  of duodenal ulcer   . History of hiatal hernia   . HIV (human immunodeficiency virus infection) ([place]) a. Dx in 1980's.  Marland Kitchen Hyperlipidemia   . Hypertension   . Hypothyroidism   . 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 Kidney stones   . MI (myocardial infarction)    "I've had 4" (06/27/2015)  . NSTEMI (non-ST elevated myocardial infarction) ([place]) 03/2015  . Pneumonia "several times"  . Stroke [facility])    a. 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  Marland Kitchen Syncope and collapse    a. 12/2013 s/p MDT Linq ILR.  Marland Kitchen Type II diabetes mellitus (HCC)    Family History  Problem Relation Age of Onset  . Heart disease Mother   . Diabetes Mother   . Cancer Mother   . Heart attack Father   . Stroke Sister     ICH  . Diabetes Sister    Past Surgical History:  Procedure Laterality Date  . CARDIAC CATHETERIZATION  N/A 04/19/2015   Procedure: Left Heart Cath and Coronary Angiography;  Surgeon: Leonie Man, MD;  Location: [place] CV [facility];  Service: Cardiovascular;  Laterality: N/A;  . CARDIAC CATHETERIZATION N/A 04/19/2015   Procedure: Coronary Stent Intervention;  Surgeon: Leonie Man, MD;  Location: [place] [facility];  Service: Cardiovascular;  Laterality: N/A;  . CORONARY ANGIOPLASTY WITH STENT PLACEMENT  "3-4 times"   "I've got several stents"  . CORONARY ARTERY BYPASS GRAFT  1980s   "CABG X5; Dr. Lia Foyer"  . CYSTOSCOPY W/ STONE MANIPULATION  1980s  . LOOP RECORDER IMPLANT     Medtronic Reveal Model # [id number] MR safe   Social History   Social History  . Marital status: Significant Other    Spouse name: N/A  . Number of children: 0  . Years of education: MA   Occupational History  . Retired    Social History Main Topics  . Smoking status: Never Smoker  . Smokeless tobacco: Never Used  . Alcohol use 0.6 oz/week    1 Glasses of wine per week     Comment: "monthly"  . Drug use: No  . Sexual activity: Yes   Other Topics Concern  . Not on file   Social History Narrative   Patient lives at home with partner.   Caffeine use: 3 cups daily     Review of Systems: General: negative for chills, fever, night sweats or weight changes.  Cardiovascular: negative for chest pain, dyspnea on exertion, edema, orthopnea, palpitations, paroxysmal nocturnal dyspnea or shortness of breath Dermatological: negative for rash Respiratory: negative for cough or wheezing Urologic: negative for hematuria Abdominal: negative for nausea, vomiting, diarrhea, bright red blood per rectum, melena, or hematemesis Neurologic: negative for visual changes, syncope, or dizziness All other systems reviewed and are otherwise negative except as noted above.   Physical Exam:  Blood pressure 140/80, pulse 68, height 5' 8.5" (1.74 m), weight 161 lb (73 kg).  General appearance: alert, cooperative and no  distress Neck: no carotid bruit and no JVD Lungs: clear to auscultation bilaterally Heart: regular rate and rhythm, S1, S2 normal, no murmur, click, rub or gallop Extremities: extremities normal, atraumatic, no cyanosis or edema Pulses: 2+ and symmetric Skin: Skin color, texture, turgor normal. No rashes or lesions Neurologic: right sided extremity weakness, residual from previous CVAs.  EKG not performed.   ASSESSMENT AND PLAN:   1. CAD: s/p CABG x 5. NSTEMI 03/2015 resulting in PCI +  DES to sequential vein graft  RPDA  RPLB3. Stable w/o any recent anginal symptoms. Continue medical therapy ASA, Plavix, Lipitor, Metoprolol and Imdur.   2. Ischemic Cardiomyopathy/Chronic Systolic CHF: EF of [id number]. He has been maintained on ASA, plavix, bb, nitrate, and statin therapy. He is not on an ACEI/ARB/ARNI/aldosterone blocker secondary to prior intolerance and h/o syncope in the setting of CHF medications. He is euvolemic w/o dyspnea. Loop recorder interrogated today. This has been present since 2015. No evidence of any ventricular arrhthymias.    4. Multiple strokes: Left Midbrain Lucunar Infarct diagnosed11/2016 and treated at [facility]. This has resulted in persistent right-sided weakness, requiring use of a cane. Treated with ASA and Plavix but had recent recurrent left sided stroke, treated at [facility] 03/2016. Carotid dopplers-1-39% right internal carotid artery stenosis and low range 40-59% left internal carotid artery stenosis. Telemetry showed NSR w/o evidence of any atrial arrhthymias. TTE showed no cardiac source of emboli. EF was 20-25% with grade 1DD and diffuse hypokinesis. Our EP device clinic was able to interrogate his loop recorder today. It was implanted 12/2013 and still functioning with approximately 8 months of longevity remaining. No arrhthymias have been captured, including no afib or flutter. Per EP device clinic, they will arrange f/u so that this can continued to be  monitored by [facility]. EP will determine need for replacement, once his current device reaches end-of-life status in 8 months.  4. HIV: on antiretroviral's. Followed at [facility].   5. H/o Syncope: has an implantable loop recorder. No significant arrhthymias. He denies any recurrence.   6. DM: per PCP.    PLAN  F/u with Dr. Debara Pickett in 3 months.   Lyda Jester PA-C 05/30/2016 5:03 PM

## 2016-05-30 NOTE — Progress Notes (Signed)
Loop check in clinic. Add on while seeing Ellen Henri, [place] s/p CVA 04/24/16. Previously marked "inactive" due to disconnected monitor and lack of pt follow up. Battery status: good. R-waves 1.28mV. 2 tachy episodes- both on 04/25/16- ?EMI. 1 brady episode- undersensing after PVCs. 0 AF episodes. Added in [facility] and new monitor was ordered. Monthly summary reports and ROV with [facility] PRN.

## 2016-06-03 ENCOUNTER — Telehealth: Payer: Self-pay

## 2016-06-03 NOTE — Telephone Encounter (Signed)
Call placed to the patient and confirmed his appointment for tomorrow - 06/04/16 @ 0945. He said that he has transportation to the clinic and would be there at 0930. Reminded him to bring all of his medications with him to the appointment and he said he would.

## 2016-06-04 ENCOUNTER — Inpatient Hospital Stay: Payer: Medicaid Other | Admitting: Family Medicine

## 2016-06-10 ENCOUNTER — Telehealth: Payer: Self-pay

## 2016-06-10 NOTE — Telephone Encounter (Signed)
Call placed to the patient and confirmed his appointment for tomorrow at [facility] - 06/11/16 @ 0945. He also confirmed that he has transportation to the clinic. Reminded him to bring his medications and his weight log and blood sugar log with him to the appointment. He stated that he is feeling " fine" no questions/concerns reported at this time.

## 2016-06-11 ENCOUNTER — Inpatient Hospital Stay: Payer: Medicaid Other | Admitting: Family Medicine

## 2016-06-14 ENCOUNTER — Telehealth: Payer: Self-pay

## 2016-06-14 NOTE — Telephone Encounter (Signed)
This Case Manager placed call to patient to remind him of his follow-up appointment on 06/17/16 at 1400 with Dr. Jarold Song. Patient indicated he would be at his appointment. Reminded patient to bring all medi

## 2016-06-14 NOTE — Telephone Encounter (Signed)
This Case Manager placed call to patient to remind him of his follow-up appointment on 06/17/16 at 1400 with Dr. Jarold Song. Patient indicated he would be at his appointment. Reminded patient to bring his medications, blood glucose log, and weight log to his appointment for Dr. Jarold Song to review. Patient verbalized understanding. No additional needs identified.

## 2016-06-15 ENCOUNTER — Emergency Department (HOSPITAL_COMMUNITY): Payer: Medicaid Other

## 2016-06-15 ENCOUNTER — Encounter (HOSPITAL_COMMUNITY): Payer: Self-pay | Admitting: Emergency Medicine

## 2016-06-15 ENCOUNTER — Inpatient Hospital Stay (HOSPITAL_COMMUNITY)
Admission: EM | Admit: 2016-06-15 | Discharge: 2016-06-27 | DRG: 302 | Disposition: E | Payer: Medicaid Other | Attending: Family Medicine | Admitting: Family Medicine

## 2016-06-15 DIAGNOSIS — Z794 Long term (current) use of insulin: Secondary | ICD-10-CM

## 2016-06-15 DIAGNOSIS — Z955 Presence of coronary angioplasty implant and graft: Secondary | ICD-10-CM

## 2016-06-15 DIAGNOSIS — I472 Ventricular tachycardia: Secondary | ICD-10-CM | POA: Diagnosis not present

## 2016-06-15 DIAGNOSIS — Z7982 Long term (current) use of aspirin: Secondary | ICD-10-CM | POA: Diagnosis not present

## 2016-06-15 DIAGNOSIS — Z8589 Personal history of malignant neoplasm of other organs and systems: Secondary | ICD-10-CM

## 2016-06-15 DIAGNOSIS — E1140 Type 2 diabetes mellitus with diabetic neuropathy, unspecified: Secondary | ICD-10-CM | POA: Diagnosis present

## 2016-06-15 DIAGNOSIS — B20 Human immunodeficiency virus [HIV] disease: Secondary | ICD-10-CM | POA: Diagnosis present

## 2016-06-15 DIAGNOSIS — I130 Hypertensive heart and chronic kidney disease with heart failure and stage 1 through stage 4 chronic kidney disease, or unspecified chronic kidney disease: Secondary | ICD-10-CM | POA: Diagnosis present

## 2016-06-15 DIAGNOSIS — N182 Chronic kidney disease, stage 2 (mild): Secondary | ICD-10-CM | POA: Diagnosis present

## 2016-06-15 DIAGNOSIS — R079 Chest pain, unspecified: Secondary | ICD-10-CM | POA: Diagnosis present

## 2016-06-15 DIAGNOSIS — K219 Gastro-esophageal reflux disease without esophagitis: Secondary | ICD-10-CM | POA: Diagnosis present

## 2016-06-15 DIAGNOSIS — Z6824 Body mass index (BMI) 24.0-24.9, adult: Secondary | ICD-10-CM

## 2016-06-15 DIAGNOSIS — I469 Cardiac arrest, cause unspecified: Secondary | ICD-10-CM | POA: Diagnosis not present

## 2016-06-15 DIAGNOSIS — I252 Old myocardial infarction: Secondary | ICD-10-CM | POA: Diagnosis not present

## 2016-06-15 DIAGNOSIS — Z833 Family history of diabetes mellitus: Secondary | ICD-10-CM

## 2016-06-15 DIAGNOSIS — Z8711 Personal history of peptic ulcer disease: Secondary | ICD-10-CM | POA: Diagnosis not present

## 2016-06-15 DIAGNOSIS — I69322 Dysarthria following cerebral infarction: Secondary | ICD-10-CM

## 2016-06-15 DIAGNOSIS — E46 Unspecified protein-calorie malnutrition: Secondary | ICD-10-CM | POA: Diagnosis present

## 2016-06-15 DIAGNOSIS — I25110 Atherosclerotic heart disease of native coronary artery with unstable angina pectoris: Principal | ICD-10-CM | POA: Diagnosis present

## 2016-06-15 DIAGNOSIS — Z951 Presence of aortocoronary bypass graft: Secondary | ICD-10-CM

## 2016-06-15 DIAGNOSIS — Z885 Allergy status to narcotic agent status: Secondary | ICD-10-CM

## 2016-06-15 DIAGNOSIS — Z7902 Long term (current) use of antithrombotics/antiplatelets: Secondary | ICD-10-CM

## 2016-06-15 DIAGNOSIS — I5022 Chronic systolic (congestive) heart failure: Secondary | ICD-10-CM | POA: Diagnosis present

## 2016-06-15 DIAGNOSIS — E039 Hypothyroidism, unspecified: Secondary | ICD-10-CM | POA: Diagnosis present

## 2016-06-15 DIAGNOSIS — Z809 Family history of malignant neoplasm, unspecified: Secondary | ICD-10-CM | POA: Diagnosis not present

## 2016-06-15 DIAGNOSIS — E785 Hyperlipidemia, unspecified: Secondary | ICD-10-CM | POA: Diagnosis present

## 2016-06-15 DIAGNOSIS — E1122 Type 2 diabetes mellitus with diabetic chronic kidney disease: Secondary | ICD-10-CM | POA: Diagnosis present

## 2016-06-15 DIAGNOSIS — Z8249 Family history of ischemic heart disease and other diseases of the circulatory system: Secondary | ICD-10-CM | POA: Diagnosis not present

## 2016-06-15 DIAGNOSIS — Z823 Family history of stroke: Secondary | ICD-10-CM

## 2016-06-15 DIAGNOSIS — Z880 Allergy status to penicillin: Secondary | ICD-10-CM

## 2016-06-15 LAB — CBC
HCT: 42 % (ref 39.0–52.0)
HEMOGLOBIN: 14.5 g/dL (ref 13.0–17.0)
MCH: 30.9 pg (ref 26.0–34.0)
MCHC: 34.5 g/dL (ref 30.0–36.0)
MCV: 89.4 fL (ref 78.0–100.0)
Platelets: 195 10*3/uL (ref 150–400)
RBC: 4.7 MIL/uL (ref 4.22–5.81)
RDW: 12.1 % (ref 11.5–15.5)
WBC: 10.1 10*3/uL (ref 4.0–10.5)

## 2016-06-15 LAB — TROPONIN I: Troponin I: 0.59 ng/mL (ref <0.03)

## 2016-06-15 LAB — BASIC METABOLIC PANEL
ANION GAP: 13 (ref 5–15)
BUN: 22 mg/dL — ABNORMAL HIGH (ref 6–20)
CALCIUM: 9.7 mg/dL (ref 8.9–10.3)
CHLORIDE: 98 mmol/L — AB (ref 101–111)
CO2: 22 mmol/L (ref 22–32)
Creatinine, Ser: 1.42 mg/dL — ABNORMAL HIGH (ref 0.61–1.24)
GFR calc non Af Amer: 53 mL/min — ABNORMAL LOW (ref >60)
Glucose, Bld: 216 mg/dL — ABNORMAL HIGH (ref 65–99)
Potassium: 3.7 mmol/L (ref 3.5–5.1)
Sodium: 133 mmol/L — ABNORMAL LOW (ref 135–145)

## 2016-06-15 LAB — I-STAT TROPONIN, ED: TROPONIN I, POC: 0.02 ng/mL (ref 0.00–0.08)

## 2016-06-15 LAB — LIPASE, BLOOD: Lipase: 18 U/L (ref 11–51)

## 2016-06-15 LAB — TSH: TSH: 0.076 u[IU]/mL — ABNORMAL LOW (ref 0.350–4.500)

## 2016-06-15 LAB — GLUCOSE, CAPILLARY: Glucose-Capillary: 318 mg/dL — ABNORMAL HIGH (ref 65–99)

## 2016-06-15 LAB — AMMONIA: AMMONIA: 22 umol/L (ref 9–35)

## 2016-06-15 LAB — ETHANOL

## 2016-06-15 MED ORDER — ACETAMINOPHEN 325 MG PO TABS: 650.0000 mg | ORAL_TABLET | ORAL | Status: DC | PRN

## 2016-06-15 MED ORDER — ONDANSETRON HCL 4 MG/2ML IJ SOLN
4.0000 mg | Freq: Once | INTRAMUSCULAR | Status: AC
  Administered 2016-06-15: 4 mg via INTRAVENOUS
  Filled 2016-06-15: qty 2

## 2016-06-15 MED ORDER — MORPHINE SULFATE (PF) 4 MG/ML IV SOLN: 1.0000 mg | Freq: Once | INTRAVENOUS | Status: DC

## 2016-06-15 MED ORDER — RITONAVIR 100 MG PO CAPS
100.0000 mg | ORAL_CAPSULE | Freq: Every day | ORAL | Status: DC
  Filled 2016-06-15: qty 1

## 2016-06-15 MED ORDER — DARUNAVIR ETHANOLATE 800 MG PO TABS
800.0000 mg | ORAL_TABLET | Freq: Every day | ORAL | Status: DC
  Filled 2016-06-15: qty 1

## 2016-06-15 MED ORDER — ATORVASTATIN CALCIUM 20 MG PO TABS: 20.0000 mg | ORAL_TABLET | Freq: Every day | ORAL | Status: DC

## 2016-06-15 MED ORDER — PROMETHAZINE HCL 25 MG/ML IJ SOLN
12.5000 mg | Freq: Once | INTRAMUSCULAR | Status: AC
  Administered 2016-06-15: 12.5 mg via INTRAVENOUS
  Filled 2016-06-15: qty 1

## 2016-06-15 MED ORDER — GI COCKTAIL ~~LOC~~
30.0000 mL | Freq: Four times a day (QID) | ORAL | Status: DC | PRN
  Administered 2016-06-15: 30 mL via ORAL
  Filled 2016-06-15: qty 30

## 2016-06-15 MED ORDER — KETOROLAC TROMETHAMINE 30 MG/ML IJ SOLN
30.0000 mg | Freq: Once | INTRAMUSCULAR | Status: AC
  Administered 2016-06-15: 30 mg via INTRAVENOUS
  Filled 2016-06-15: qty 1

## 2016-06-15 MED ORDER — MORPHINE SULFATE (PF) 4 MG/ML IV SOLN: 4.0000 mg | Freq: Once | INTRAVENOUS | Status: DC

## 2016-06-15 MED ORDER — INSULIN GLARGINE 100 UNIT/ML ~~LOC~~ SOLN
10.0000 [IU] | Freq: Every day | SUBCUTANEOUS | Status: DC
  Filled 2016-06-15: qty 0.1

## 2016-06-15 MED ORDER — HEPARIN (PORCINE) IN NACL 100-0.45 UNIT/ML-% IJ SOLN
900.0000 [IU]/h | INTRAMUSCULAR | Status: DC
  Administered 2016-06-15: 900 [IU]/h via INTRAVENOUS
  Filled 2016-06-15: qty 250

## 2016-06-15 MED ORDER — ASPIRIN EC 81 MG PO TBEC
81.0000 mg | DELAYED_RELEASE_TABLET | Freq: Every day | ORAL | Status: DC
  Administered 2016-06-15: 81 mg via ORAL
  Filled 2016-06-15: qty 1

## 2016-06-15 MED ORDER — MORPHINE SULFATE (PF) 4 MG/ML IV SOLN
4.0000 mg | Freq: Once | INTRAVENOUS | Status: AC
  Administered 2016-06-15: 4 mg via INTRAVENOUS
  Filled 2016-06-15: qty 1

## 2016-06-15 MED ORDER — PANTOPRAZOLE SODIUM 40 MG PO TBEC: 40.0000 mg | DELAYED_RELEASE_TABLET | Freq: Every day | ORAL | Status: DC

## 2016-06-15 MED ORDER — ISOSORBIDE MONONITRATE ER 30 MG PO TB24: 30.0000 mg | ORAL_TABLET | Freq: Every day | ORAL | Status: DC

## 2016-06-15 MED ORDER — EMTRICITABINE-TENOFOVIR AF 200-25 MG PO TABS: 1.0000 | ORAL_TABLET | Freq: Every day | ORAL | Status: DC

## 2016-06-15 MED ORDER — INSULIN ASPART 100 UNIT/ML ~~LOC~~ SOLN: 0.0000 [IU] | Freq: Three times a day (TID) | SUBCUTANEOUS | Status: DC

## 2016-06-15 MED ORDER — CITALOPRAM HYDROBROMIDE 20 MG PO TABS
20.0000 mg | ORAL_TABLET | Freq: Every day | ORAL | Status: DC
  Administered 2016-06-15: 20 mg via ORAL
  Filled 2016-06-15: qty 1

## 2016-06-15 MED ORDER — HEPARIN BOLUS VIA INFUSION
4000.0000 [IU] | Freq: Once | INTRAVENOUS | Status: AC
  Administered 2016-06-15: 4000 [IU] via INTRAVENOUS
  Filled 2016-06-15: qty 4000

## 2016-06-15 MED ORDER — LEVOTHYROXINE SODIUM 100 MCG PO TABS: 300.0000 ug | ORAL_TABLET | Freq: Every day | ORAL | Status: DC

## 2016-06-15 MED ORDER — ONDANSETRON HCL 4 MG/2ML IJ SOLN: 4.0000 mg | Freq: Four times a day (QID) | INTRAMUSCULAR | Status: DC | PRN

## 2016-06-15 MED ORDER — METOPROLOL SUCCINATE ER 25 MG PO TB24: 12.5000 mg | ORAL_TABLET | Freq: Every day | ORAL | Status: DC

## 2016-06-15 MED ORDER — IOPAMIDOL (ISOVUE-370) INJECTION 76%
INTRAVENOUS | Status: AC
  Administered 2016-06-15: 80 mL
  Filled 2016-06-15: qty 100

## 2016-06-16 LAB — HEMOGLOBIN A1C
Hgb A1c MFr Bld: 10 % — ABNORMAL HIGH (ref 4.8–5.6)
Mean Plasma Glucose: 240 mg/dL

## 2016-06-17 ENCOUNTER — Inpatient Hospital Stay: Payer: Medicaid Other | Admitting: Family Medicine

## 2016-06-17 MED FILL — Medication: Qty: 1 | Status: AC

## 2016-06-18 LAB — VITAMIN B1: Vitamin B1 (Thiamine): 159.3 nmol/L (ref 66.5–200.0)

## 2016-06-18 NOTE — Discharge Summary (Signed)
[facility] Discharge Summary  Patient name: Jackson Shaw Medical record number: [id number] Date of birth: Dec 01, 1955 Age: 61 y.o. Gender: male Date of Admission: 2016/07/07  Date of Death:July 07, 2016 Admitting Physician: Zenia Resides, MD  Primary Care Provider: Arnoldo Morale, MD Consultants: CArdiology  Indication for Hospitalization:Chest pain  Death Diagnoses/Problem List:  PEA   Disposition: deceased return to family  Discharge Condition: Deceased  Discharge Exam: N/A  Brief Hospital Course: Patient is a 61 yo male with a past medical history significant for CAD s/p CABG, CHF, DM2 and HIV who presented with left sided substernal chest pain concerning for ACS. Pain radiated to his jaw and patient also complained of nausea and diaphoresis. EKG findings were concerning with ST depression on the inferior leads and new T wave inversion. Cardiology was consulted and patient was started on heparin drip. Upon arrival to stepdown floor patient, according to the partner in the room patient vomited blood and coded. Code blue was called and attempt to resuscite patient were unfortunately unsuccessful. Cardiology and Family medicine team were at bedside during code. Medical examiner was called and case was presented and patient was not a case for the examiner.Family did not want autopsy and opted for cremation. Causes of death are unknown but given patient extensive cardiac history, multiple comorbidities and acute presentation, death is most likely secondary to complex cardiac process.  Issues for Follow Up: None   Significant Procedures: None   Significant Labs and Imaging:   Recent Labs Lab 07-07-16 1317  WBC 10.1  HGB 14.5  HCT 42.0  PLT 195    Recent Labs Lab 07-07-2016 1317  NA 133*  K 3.7  CL 98*  CO2 22  GLUCOSE 216*  BUN 22*  CREATININE 1.42*  CALCIUM 9.7    None   Results/Tests Pending at Time of Discharge: None   Discharge  Medications:  Allergies as of 06/16/2016      Reactions   Penicillins Anaphylaxis   Has patient had a PCN reaction causing immediate rash, facial/tongue/throat swelling, SOB or lightheadedness with hypotension: Yes Has patient had a PCN reaction causing severe rash involving mucus membranes or skin necrosis: No Has patient had a PCN reaction that required hospitalization No Has patient had a PCN reaction occurring within the last 10 years: No If all of the above answers are "NO", then may proceed with Cephalosporin use.   Codeine Nausea Only      Medication List    ASK your doctor about these medications   aspirin EC 81 MG tablet Take 81 mg by mouth daily.   atorvastatin 20 MG tablet Commonly known as:  LIPITOR Take 1 tablet (20 mg total) by mouth daily.   citalopram 20 MG tablet Commonly known as:  CELEXA Take 1 tablet (20 mg total) by mouth daily.   clopidogrel 75 MG tablet Commonly known as:  PLAVIX Take 1 tablet (75 mg total) by mouth daily.   Darunavir Ethanolate 800 MG tablet Commonly known as:  PREZISTA Take 800 mg by mouth daily.   emtricitabine-tenofovir 200-300 MG tablet Commonly known as:  TRUVADA Take 1 tablet by mouth daily.   gabapentin 300 MG capsule Commonly known as:  NEURONTIN One capsule at night for 2 weeks, then take one capsule twice a day   insulin aspart 100 UNIT/ML injection Commonly known as:  novoLOG Inject 6 Units into the skin 3 (three) times daily with meals. 6 units is the base. Add 2 units for each 15gm  of carbs   Insulin Glargine 100 UNIT/ML Solostar Pen Commonly known as:  LANTUS SOLOSTAR Inject 20 Units into the skin daily at 10 pm.   isosorbide mononitrate 30 MG 24 hr tablet Commonly known as:  IMDUR Take 1 tablet (30 mg total) by mouth daily.   levothyroxine 75 MCG tablet Commonly known as:  SYNTHROID, LEVOTHROID Take 4 tablets (300 mcg total) by mouth daily before breakfast. Must have office visit for refills   metoprolol  succinate 25 MG 24 hr tablet Commonly known as:  TOPROL XL Take 1 tablet (25 mg total) by mouth daily.   nitroGLYCERIN 0.4 MG SL tablet Commonly known as:  NITROSTAT Place 1 tablet (0.4 mg total) under the tongue every 5 (five) minutes as needed for chest pain.   pantoprazole 40 MG tablet Commonly known as:  PROTONIX Take 40 mg by mouth daily.   ritonavir 100 MG capsule Commonly known as:  NORVIR Take 100 mg by mouth daily.       Discharge Instructions: Please refer to Patient Instructions section of EMR for full details.  Patient was counseled important signs and symptoms that should prompt return to medical care, changes in medications, dietary instructions, activity restrictions, and follow up appointments.   Follow-Up Appointments:   Marjie Skiff, MD 06/18/2016, 4:08 PM PGY-1, [place]

## 2016-06-19 MED FILL — Medication: Qty: 1 | Status: AC

## 2016-06-27 NOTE — Progress Notes (Signed)
CRITICAL VALUE ALERT  Critical value received:  Troponin 0.59  Date of notification:  Jul 02, 2016  Time of notification:  1959  Critical value read back: yes  Nurse who received alert:  Dorise Bullion  MD notified (1st page):  Family Medicine  Time of first page:  2003  MD notified (2nd page):  Time of second page:  Responding MD:   Time MD responded:

## 2016-06-27 NOTE — ED Notes (Signed)
Admitting at bedside, discussed need for step down bed d/t vitals, MD to change bed request, bed placement aware

## 2016-06-27 NOTE — ED Provider Notes (Signed)
[facility] DEPT Provider Note   CSN: [id number] Arrival date & time: 2016/07/09  1306     History   Chief Complaint Chief Complaint  Patient presents with  . Chest Pain    HPI Jackson Shaw is a 61 y.o. male.  Patient is a 61 year old male with extensive past medical history including HIV disease, coronary artery disease, CHF, and prior CVA. He presents for evaluation of chest pain. This began suddenly approximately 2 hours prior to presentation. He reports he was sitting in a car with a friend when this began. His pain radiates to his neck and left arm. He reports nausea and shortness of breath. He denies any fevers, chills, or recent cough.    Chest Pain   This is a recurrent problem. The current episode started 1 to 2 hours ago. The problem occurs constantly. The problem has been rapidly worsening. The pain is present in the substernal region. The pain is severe. The pain radiates to the left neck and left shoulder. The symptoms are aggravated by certain positions. Associated symptoms include shortness of breath. Pertinent negatives include no abdominal pain, no dizziness, no fever and no headaches. He has tried nothing for the symptoms.    Past Medical History:  Diagnosis Date  . Anginal pain ([place])   . Anxiety   . Aspiration pneumonia ([place]) 06/27/2015  . Chronic systolic CHF (congestive heart failure) ([place])    a. 12/2013 Echo: EF 45%;  b. 03/2015 Echo: EF 35%, mild MR, mildly dil LA.  Marland Kitchen Coronary artery disease    a. s/p prior CABG;  b. 03/2015 NSTEMI/PCI: 100ost CTO, LAD 100ost/p, 45d, D1 100/95ost, RI 75, LCX 100p, OM1/3 small, RCA 100ost->distal, RPDA/RPAV small,RPA1 80, RPA2 90, RPLB 80, RPLB2 80,  VG->D1->RI 10p, patent stents, VG->RPDA->RPLB3 99 before RPDA (3.5x24 Promus DES), 100 between PDA& RPLB3 (CTO), LIMA->LAD nl.  . Diabetic neuropathy ([place]) 12/22/2014  . GERD (gastroesophageal reflux disease)   . History of duodenal ulcer   . History of hiatal hernia   . HIV  (human immunodeficiency virus infection) ([place]) a. Dx in 1980's.  Marland Kitchen Hyperlipidemia   . Hypertension   . Hypothyroidism   . 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 Kidney stones   . MI (myocardial infarction)    "I've had 4" (06/27/2015)  . NSTEMI (non-ST elevated myocardial infarction) ([place]) 03/2015  . Pneumonia "several times"  . Stroke [facility])    a. 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  Marland Kitchen Syncope and collapse    a. 12/2013 s/p MDT Linq ILR.  Marland Kitchen Type II diabetes mellitus [facility])     Patient Active Problem List   Diagnosis Date Noted  . Chronic systolic CHF (congestive heart failure) ([place]) 04/30/2016  . Stroke (cerebrum) ([place]) 04/25/2016  . CVA (cerebral vascular accident) ([place]) 04/25/2016  . Cerebrovascular accident (CVA) ([place])   . History of stroke 07/17/2015  . 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 Hypertension 07/17/2015  . Hyperlipidemia 07/17/2015  . Bilateral pleural effusion 06/27/2015  . Aspiration pneumonia ([place]) 06/27/2015  . Acute respiratory distress 06/27/2015  . Ischemic cardiomyopathy/EF 25% 06/27/2015  . Aspiration pneumonia of right middle lobe ([place])   . NSTEMI (non-ST elevated myocardial infarction) ([place]) 04/19/2015  . Coronary artery disease involving coronary bypass graft of native heart with unstable angina pectoris ([place])   . History of CVA (cerebrovascular accident) 04/05/2015  . Diabetic neuropathy ([place]) 12/22/2014  . Syncope 08/08/2014  . Status post  placement of implantable loop recorder 08/08/2014  . Chest pain 08/08/2014  . 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 Human immunodeficiency virus (HIV) disease ([place]) 10/12/2008  . [place] SARCOMA 10/12/2008  . Hypothyroidism 10/12/2008  . Insulin dependent type 2 diabetes mellitus, uncontrolled ([place]) 10/12/2008  . HYPERCHOLESTEROLEMIA 10/12/2008  . S/P CABG x 5 10/12/2008  . GASTROESOPHAGEAL REFLUX DISEASE 10/12/2008  . RENAL DISEASE, CHRONIC,  STAGE II 10/12/2008    Past Surgical History:  Procedure Laterality Date  . CARDIAC CATHETERIZATION N/A 04/19/2015   Procedure: Left Heart Cath and Coronary Angiography;  Surgeon: Leonie Man, MD;  Location: [facility];  Service: Cardiovascular;  Laterality: N/A;  . CARDIAC CATHETERIZATION N/A 04/19/2015   Procedure: Coronary Stent Intervention;  Surgeon: Leonie Man, MD;  Location: [facility];  Service: Cardiovascular;  Laterality: N/A;  . CORONARY ANGIOPLASTY WITH STENT PLACEMENT  "3-4 times"   "I've got several stents"  . CORONARY ARTERY BYPASS GRAFT  1980s   "CABG X5; Dr. Lia Foyer"  . CYSTOSCOPY W/ STONE MANIPULATION  1980s  . LOOP RECORDER IMPLANT     Medtronic Reveal Model # [id number] MR safe       Home Medications    Prior to Admission medications   Medication Sig Start Date End Date Taking? Authorizing Provider  aspirin EC 81 MG tablet Take 81 mg by mouth daily.  08/12/12   Historical Provider, MD  atorvastatin (LIPITOR) 20 MG tablet Take 1 tablet (20 mg total) by mouth daily. 07/06/15   Brittainy Erie Noe, PA-C  benzonatate (TESSALON) 100 MG capsule Take 1 capsule (100 mg total) by mouth every 8 (eight) hours. 01/23/16   Shawn C Joy, PA-C  citalopram (CELEXA) 20 MG tablet Take 1 tablet (20 mg total) by mouth daily. 04/30/16   Arnoldo Morale, MD  clopidogrel (PLAVIX) 75 MG tablet Take 1 tablet (75 mg total) by mouth daily. 07/06/15   Brittainy Erie Noe, PA-C  Darunavir Ethanolate (PREZISTA) 800 MG tablet Take 800 mg by mouth daily.     Historical Provider, MD  emtricitabine-tenofovir (TRUVADA) 200-300 MG per tablet Take 1 tablet by mouth daily.    Historical Provider, MD  gabapentin (NEURONTIN) 300 MG capsule One capsule at night for 2 weeks, then take one capsule twice a day 12/22/14   Kathrynn Ducking, MD  insulin aspart (NOVOLOG) 100 UNIT/ML injection Inject 6 Units into the skin 3 (three) times daily with meals. 6 units is the base. Add 2 units for each 15gm of  carbs    Historical Provider, MD  Insulin Glargine (LANTUS SOLOSTAR) 100 UNIT/ML Solostar Pen Inject 20 Units into the skin daily at 10 pm. 07/17/15   Arnoldo Morale, MD  Insulin Pen Needle 31G X 5 MM MISC 1 each by Does not apply route at bedtime. 07/03/15   Arnoldo Morale, MD  isosorbide mononitrate (IMDUR) 30 MG 24 hr tablet Take 1 tablet (30 mg total) by mouth daily. 07/06/15   Brittainy Erie Noe, PA-C  Lancets (FREESTYLE) lancets Use as instructed TID 04/26/16   Reyne Dumas, MD  levothyroxine (SYNTHROID, LEVOTHROID) 75 MCG tablet Take 4 tablets (300 mcg total) by mouth daily before breakfast. Must have office visit for refills 11/06/15   Arnoldo Morale, MD  metoprolol succinate (TOPROL XL) 25 MG 24 hr tablet Take 1 tablet (25 mg total) by mouth daily. 07/06/15   Brittainy Erie Noe, PA-C  nitroGLYCERIN (NITROSTAT) 0.4 MG SL tablet Place 1 tablet (0.4 mg total) under the tongue every  5 (five) minutes as needed for chest pain. 04/20/15   Rogelia Mire, NP  pantoprazole (PROTONIX) 40 MG tablet Take 40 mg by mouth daily.     Historical Provider, MD  ritonavir (NORVIR) 100 MG capsule Take 100 mg by mouth daily.     Historical Provider, MD    Family History Family History  Problem Relation Age of Onset  . Heart disease Mother   . Diabetes Mother   . Cancer Mother   . Heart attack Father   . Stroke Sister     ICH  . Diabetes Sister     Social History Social History  Substance Use Topics  . Smoking status: Never Smoker  . Smokeless tobacco: Never Used  . Alcohol use 0.6 oz/week    1 Glasses of wine per week     Comment: "monthly"     Allergies   Penicillins and Codeine   Review of Systems Review of Systems  Constitutional: Negative for fever.  Respiratory: Positive for shortness of breath.   Cardiovascular: Positive for chest pain.  Gastrointestinal: Negative for abdominal pain.  Neurological: Negative for dizziness and headaches.  All other systems reviewed and are  negative.    Physical Exam Updated Vital Signs BP 134/97   Pulse 90   Temp 98.5 F (36.9 C) (Oral)   Resp 16   Ht 5' 8.5" (1.74 m)   Wt 161 lb (73 kg)   SpO2 100%   BMI 24.12 kg/m   Physical Exam  Constitutional: He is oriented to person, place, and time. He appears well-developed and well-nourished. No distress.  Patient appears very anxious, is hyperventilating, and is diaphoretic across his forehead.  HENT:  Head: Normocephalic and atraumatic.  Mouth/Throat: Oropharynx is clear and moist.  Neck: Normal range of motion. Neck supple.  Cardiovascular: Normal rate and regular rhythm.  Exam reveals no friction rub.   No murmur heard. Pulmonary/Chest: Effort normal and breath sounds normal. No respiratory distress. He has no wheezes. He has no rales.  Patient is hyperventilating, however there is no wheezing and no rales.  Abdominal: Soft. Bowel sounds are normal. He exhibits no distension. There is no tenderness.  Musculoskeletal: Normal range of motion. He exhibits no edema.  Neurological: He is alert and oriented to person, place, and time. Coordination normal.  Skin: Skin is warm. He is diaphoretic.  Nursing note and vitals reviewed.    ED Treatments / Results  Labs (all labs ordered are listed, but only abnormal results are displayed) [place], ED    EKG  EKG Interpretation None       Radiology No results found.  Procedures Procedures (including critical care time)  Medications Ordered in ED Medications  ondansetron (ZOFRAN) injection 4 mg (not administered)  morphine 4 MG/ML injection 4 mg (not administered)     Initial Impression / Assessment and Plan / ED Course  I have reviewed the triage vital signs and the nursing notes.  Pertinent labs & imaging results that were available during my care of the patient were reviewed by me and considered in my medical decision making (see chart for  details).     Patient presents here with complaints of chest pain that is somewhat atypical for cardiac pain. He does have extensive are accurate history, however he tells me this feels different. He is slightly diaphoretic and appears uncomfortable. His pain was only partially relieved with morphine. His EKG shows possible ischemic  changes that have also been reviewed by Dr. Marlou Porch from cardiology. He feels as though observation with serial troponins is appropriate. The patient will be admitted to the family medicine service under the care of Dr. Andria Frames.  Final Clinical Impressions(s) / ED Diagnoses   Final diagnoses:  None    New Prescriptions New Prescriptions   No medications on file     Veryl Speak, MD 2016-07-13 1625

## 2016-06-27 NOTE — ED Notes (Signed)
Holding Morphine d/t BP, pt aware

## 2016-06-27 NOTE — Consult Note (Signed)
CARDIOLOGY CONSULT  HPI:  Jackson Shaw is a 61 y.o. old male who present from home with sudden onset chest pain and diaphoresis.  He states the pain is localized to the left pectoral region and increases when he takes a deep breath in.  This occurred while he was sitting at home watching TV. The pain is described as sharp in nature.  He has a significant cardiac history with CABG in 2012 and recent vein graft stent placed in November of 2016.  He feels his dyspnea has been increasing over the last few weeks.  Pain is still 8/10.  ECG with ST segment depressions in the V3-V6 which are new compared to his previous ECG.  Initial troponin is negative, however this was drawn about 20 minutes after the pain started.    Assessment/Plan Chest Pain   Assessment:  Chest pain is very atypical in nature however given the ST segment depression we'll treat as ACS for now.   Plan  -  Heparin and ASA  -  Trend troponin  -  Lipitor 80  -  Repeat ECHO  -  Likely cath on Monday  -  Continue plavix  Past Cardiovascular History:  +CAD +MI +CHF - No documented h/o PVD - No documented h/o AAA - No documented h/o valvular heart disease - No documented h/o CVA - No documented h/o Arrhythmias - No documented h/o A-fib  - No documented h/o congenital heart disease +CABG - No documented h/o PCI - No documented h/o cardiac devices (Pacer/ICD/CRT) - No documented h/o cardiac surgery       Most recent stress test:  None  Most recent echocardiography:  None  Most recent left heart catheterization:   04/19/15 1. CULPRIT LESION: Dist SVG-RPDA Insertion lesion, before RPDA, 99% stenosed. PCI with Promus Premier DES 3.5 mm x 24 mm (tapered from 4.1-3.7 mm). Post intervention, there is a 0% residual stenosis. 2. There is severe left ventricular systolic dysfunction. 3. Severe native coronary artery disease with 100% occluded left main and ostial RCA. -- Collaterals from very proximal RCA branch distal RCA and  distal small vessel circumflex. 4. Ost RCA to Dist RCA lesion, 100% stenosed. Ost LM to LM lesion, 100% stenosed. Ost to Prox Cx & LAD lesions, 100% stenosed. 5. Patent vein graft to RAMUS INTERMEDIUS with diffuse disease in the ramus upstream. The touchdown point the diagonal is barely visible. 6. Widely patent LIMA-LAD. Insertion site stable 45% disease with apical severe disease. 7. SEQUENTIAL limb from RPDA and 3rd RPLB, 100% stenosed - As previously described 8. Severe diffuse disease in the PDA and PL system as described:    Severe ischemic cardiomyopathy, EF appears to be lower than previously reported in the setting of acute non-ST elevation MI with severe thrombotic stenosis of the distal SVG-RPDA. Successful SVG PCI.  CABG:  2012 SVG-->1st diag, ramus SVG-->RPDA, 3rd RPLB LIMA-->LD  Device history:  None  No intake or output data in the 24 hours ending 06-23-2016 1859  MEDS:  heparin    aspirin EC 81 mg Daily  [START ON 06/16/2016] atorvastatin 20 mg Daily  citalopram 20 mg Daily  [START ON 06/16/2016] Darunavir Ethanolate 800 mg Q breakfast  [START ON 06/16/2016] emtricitabine-tenofovir AF 1 tablet Daily  heparin 4,000 Units Once  [START ON 06/16/2016] insulin aspart 0-9 Units TID WC  insulin glargine 10 Units QHS  [START ON 06/16/2016] isosorbide mononitrate 30 mg Daily  [START ON 06/16/2016] levothyroxine 300 mcg QAC breakfast  [START ON  06/16/2016] metoprolol succinate 12.5 mg Daily  morphine injection 1 mg Once  [START ON 06/16/2016] pantoprazole 40 mg Daily  [START ON 06/16/2016] ritonavir 100 mg Q breakfast    Review of Systems:  GEN: no fever, chills, nausea, vomiting, weight change  HEENT: no vision or hearing changes  PULM: no coughing, SOB  CV: no chest pain, palpitations, PND, orthopnea  GI: no abdominal pain  GU: no dysuria  EXT: no swelling  SKIN: no rashes  NEURO: no numbness or tingling  HEME: no bleeding or bruising  GYN: none  --12 point review  systems- otherwise negative.  Physical Examination: Blood pressure 96/72, pulse 96, temperature 97.9 F (36.6 C), temperature source Oral, resp. rate 13, height 5' 8.5" (1.74 m), weight 74.5 kg (164 lb 3.9 oz), SpO2 97 %. General:  AAOX 4.  NAD.  NRD.  HENT: Normocephalic. Atraumatic.  No acute abnom. EYES: PERRL EOMI  Neck: Supple.  No JVD.  No bruits. Cardiovascular:  Nl S1. Nl S2. No S3. No S4. Nl PMI. No m/r/c. RRR  Pulmonary/Chest: CTA B. No rales. No wheezing.  Abdomen: Soft, NT, no masses, no organomegaly. Neuro: CN intact, no motor/sensory deficit.  Ext: Warm. No edema.  SKIN- intact  Recent Labs     07-08-16  1317  HGB  14.5  HCT  42.0  WBC  10.1  BUN  22*  CREATININE  1.42*  GLUCOSE  216*  CALCIUM  9.7    Discuss the benefits and adverse side affects of the medications use.  Discuss the benefits and adverse side affects of the required study.  Discuss the risk and benefits of ambulation during hospitalization.   Baruch Merl, MD, PhD Cardiology

## 2016-06-27 NOTE — ED Notes (Signed)
Delo ,MD at bedside 

## 2016-06-27 NOTE — Progress Notes (Signed)
[place] for heparin Indication: chest pain/ACS  Allergies  Allergen Reactions  . Penicillins Anaphylaxis    Has patient had a PCN reaction causing immediate rash, facial/tongue/throat swelling, SOB or lightheadedness with hypotension: Yes Has patient had a PCN reaction causing severe rash involving mucus membranes or skin necrosis: No Has patient had a PCN reaction that required hospitalization No Has patient had a PCN reaction occurring within the last 10 years: No If all of the above answers are "NO", then may proceed with Cephalosporin use.   . Codeine Nausea Only    Patient Measurements: Height: 5' 8.5" (174 cm) Weight: 164 lb 3.9 oz (74.5 kg) IBW/kg (Calculated) : 69.55 Heparin Dosing Weight: 75  Vital Signs: Temp: 97.9 F (36.6 C) (01/20 1807) Temp Source: Oral (01/20 1807) BP: 96/72 (01/20 1807) Pulse Rate: 96 (01/20 1807)  Labs:  Recent Labs  19-Jun-2016 1317  HGB 14.5  HCT 42.0  PLT 195  CREATININE 1.42*    Estimated Creatinine Clearance: 55.1 mL/min (by C-G formula based on SCr of 1.42 mg/dL (H)).   Medical History: Past Medical History:  Diagnosis Date  . Anginal pain ([place])   . Anxiety   . Aspiration pneumonia ([place]) 06/27/2015  . Chronic systolic CHF (congestive heart failure) ([place])    a. 12/2013 Echo: EF 45%;  b. 03/2015 Echo: EF 35%, mild MR, mildly dil LA.  Marland Kitchen Coronary artery disease    a. s/p prior CABG;  b. 03/2015 NSTEMI/PCI: 100ost CTO, LAD 100ost/p, 45d, D1 100/95ost, RI 75, LCX 100p, OM1/3 small, RCA 100ost->distal, RPDA/RPAV small,RPA1 80, RPA2 90, RPLB 80, RPLB2 80,  VG->D1->RI 10p, patent stents, VG->RPDA->RPLB3 99 before RPDA (3.5x24 Promus DES), 100 between PDA& RPLB3 (CTO), LIMA->LAD nl.  . Diabetic neuropathy ([place]) 12/22/2014  . GERD (gastroesophageal reflux disease)   . History of duodenal ulcer   . History of hiatal hernia   . HIV (human immunodeficiency virus infection) ([place]) a. Dx in 1980's.  Marland Kitchen  Hyperlipidemia   . Hypertension   . Hypothyroidism   . 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 Kidney stones   . MI (myocardial infarction)    "I've had 4" (06/27/2015)  . NSTEMI (non-ST elevated myocardial infarction) ([place]) 03/2015  . Pneumonia "several times"  . Stroke [facility])    a. 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  Marland Kitchen Syncope and collapse    a. 12/2013 s/p MDT Linq ILR.  Marland Kitchen Type II diabetes mellitus ([facility])     Assessment: 61 yo male admitted with CP. No known anticoagulation PTA and CBC stable. Pharmacy to start heparin to r/o ACS.   Goal of Therapy:  Heparin level 0.3-0.7 units/ml Monitor platelets by anticoagulation protocol: Yes   Plan:  1. Give 4000 units bolus x 1 2. Start heparin infusion at 900 units/hr 3. Check anti-Xa level in 6 hours and daily while on heparin 4. Continue to monitor H&H and platelets  Vincenza Hews, PharmD, BCPS 2016/06/19, 6:38 PM

## 2016-06-27 NOTE — ED Notes (Signed)
Delo, MD aware of BP sys 88

## 2016-06-27 NOTE — ED Notes (Signed)
Delo, MD to place order for step down bed

## 2016-06-27 NOTE — Code Documentation (Signed)
CODE BLUE NOTE  Patient Name:  Keltyn Ratley   MRN:  [id number]   Date of Birth/ Sex:  08-29-1955 male     Admission Date: Jun 29, 2016  Attending Provider: Zenia Resides, MD  Primary Diagnosis:  Chest pain, unspecified type [R07.9]    Indication: Pt was inhis usual state of health until this PM, when he was noted to be Vtach. Code blue was subsequently called. At the time of arrival on scene, ACLS protocol was underway.   Technical Description:  - CPR performance duration:  30 minutes  - Was defibrillation or cardioversion used? Yes   - Was external pacer placed? Yes  - Was patient intubated pre/post CPR? Yes    Medications Administered: Y = Yes; Blank = No Amiodarone  1  Atropine    Calcium    Epinephrine  4  Lidocaine    Magnesium    Norepinephrine    Phenylephrine    Sodium bicarbonate    Vasopressin    Other     Post CPR evaluation:  - Final Status - Was patient successfully resuscitated ? No   Miscellaneous Information:  - Time of death:  10:35 PM  - Primary team notified?  Yes  - Family Notified? Yes          Marjie Skiff, MD 2016/06/29 10:59 PM

## 2016-06-27 NOTE — H&P (Signed)
[facility] Admission History and Physical Service Pager: [phone]  Patient name: Jackson Shaw Medical record number: [id number] Date of birth: 11/11/1955 Age: 61 y.o. Gender: male  Primary Care Provider: Arnoldo Morale, MD Consultants: Cardiology  Code Status: Full (confirmed on admission)  Chief Complaint: Chest pain  Assessment and Plan: Jackson Shaw is a 61 y.o. male presenting with chest pain and AMS. PMH is significant for HFrEF (EF 20-25% 04/2016), CAD (s/p CABG x5), HIV (on ART therapy), HTN, IDDM, GERD, CKD-II, ED, prior CVA (03/2016, on dual antiplatelet therapy).   #Atypical chest pain: Acute, new finding. Concerning for ACS given significant cardiac history and left-sided chest pain with diaphoresis. He denies consistency with chronic reflux. Initial troponin negative with follow-up 0.59, trending. EKG NSR with questionable ST depressions in leads inferior leads II, III, aVF with reciprocal changes in lateral V4-6 with mild T-wave inversion that are new. HEART score 6. Cardiology consultation recommending treatment for ACS with heparin drip. --Admit to FPTS under Dr. Andria Frames  --Consult cardiology, appreciate recommendations - will likely need cath --Heparin gtt --Morphine 1mg  q4hr prn pain --Trending troponin --EKG in AM --Toprol-XL 12.5 mg QD --Zofran PRN --Lipitor 20 mg QD --ASA 81 mg QD --Holding Plavix 75 mg QD --GI cocktail q4h PRN  #Acute mental status change / protein-calorie malnutrition: Acute, uncertain etiology. Was admitted back on 03/2016 Wernicke encephalopathy. Per patient's partner, patient's speech is stuttered and slower than baseline. Patient denies EtOH use, illicit drug use. Patient is of concern for ACS which we are ruling out. Likely having some hypoperfusion causing confusion. Possible source includes protein calorie malnutrition given HIV status and emaciated state. Ammonia level neg. EtOH neg. --Nutrition consult  pending --Thiamine level pending  --UA pending --UDS pending --monitor mental status  #CAD / HFrEF: Chronic, questionable worsening. Last echo 04/2016 with EF 20-25%. Not on optimal CHF medications --Toprol-XL 12.5 mg QD -- will need to look into why not on ACEi/ARB --monitor fluid status; SLIV --daily weights --See plan for ACS above  #Hypertension: Chronic. BP 90s/70 on admission. Was given morphine in the ED. Held fluid bolus given EF 20-25%. --Toprol-XL 12.5 mg QD  #HIV: Chronic, stable. Diagnosed 32 years ago. Sees Dr. Roselyn Reef for infectious disease in [place]. On heart therapy and compliant. Well controlled.  --Descovy 200-25 mg QD --Norvir 100 mg QD --Prezista 800 mg QD --Will obtain CD4 count  #Insulin-dependent diabetes mellitus: Chronic, stable. Last A1c 10.3 on 03/2016. Takes Lantus 20 units nightly with meal coverage 3 times a day. --Lantus 10 units qhs --Sensitive sliding scale --A1c pending --monitor CBGs  #GERD: Chronic, stable. Takes Protonix at home.  --Protonix 40 mg QD  #Chronic kidney disease stage II: Stable. Creatinine 1.42 admission (baseline 1.3). --Monitor creatinine  #Prior CVA: Chronic, stable. Uses cane to ambulate. No focal deficits. Has chronic dysarthria as side effect from stroke. --Monitor for change in neuro status --ASA 81 mg QD --Holding Plavix 75 mg QD  #Hypothyroidism: Chronic, stable. On Synthroid. Last TSH 0.205 on 03/2016. --Synthroid 300 mg QD  FEN/GI: Heart healthy/carb modified diet, KVO, protonix, GI cocktail q4h PRN Prophylaxis: Heparin gtt for ACS  Disposition: Pending ACS workup and cardiology recommendations.  History of Present Illness:  Jackson Shaw is a 61 y.o. male presenting with chest pain and AMS. PMH is significant for HFrEF (EF 20-25% 04/2016), CAD (s/p CABG x5), HIV (on ART therapy), HTN, IDDM, GERD, CKD-II, ED, prior CVA (03/2016, on dual antiplatelet therapy).  Patient experienced sudden onset L  sided chest  pain while sitting in the car at 1330 today. Chest pain is characterized with constant throbbing sensation radiating to his neck with associated diaphoresis, frontal HA, and nausea with vomiting. Patient states the pain has not improved since onset even with use of morphine and tramadol. Patient notes progressive dyspnea over the past week and feels like "he can't keep up like he used to." Dyspnea not worse with exertion and not approved at rest. Patient denies symptoms of palpitations, lightheadedness, dizziness, abdominal pain, back pain, swelling, or diarrhea. Patient is able to maintain appetite and drinking fluids. Patient has history of reflux but states "this pain is different."  In the ED, vitals remained stable however BP dropped to 90s/70s after morphine was administered. Patient was not fluid bolus given HFrEF. Patient remained afebrile without tachycardia or tachypnea with O2 sats in the 90s on 5 L [place](new oxygen requirement). Initial labs include unremarkable CBC, BMET glucose 216 with creatinine 1.42. I-STAT troponin 0.02. EKG NSR with questionable ST depressions in leads inferior leads II, III, aVF with reciprocal changes in lateral V4-6(new) with mild T-wave inversion. CXR stable cardiomegaly. There was concern for PE. CTA negative for PE but consistent with mild pulmonary edema. Cardiology consult for ACS rule out. Patient admitted to family practice teaching service.  Review Of Systems: Complete review of systems performed, see below. Refer to history of present illness for pertinent.  Review of Systems  Constitutional: Negative for chills and fever.  HENT: Negative for congestion and sore throat.   Eyes: Positive for double vision. Negative for photophobia.  Respiratory: Positive for shortness of breath. Negative for cough, hemoptysis, sputum production and wheezing.   Gastrointestinal: Positive for nausea and vomiting. Negative for abdominal pain, blood in stool, constipation and  diarrhea.  Genitourinary: Negative for dysuria, frequency and urgency.  Musculoskeletal: Negative for myalgias and neck pain.  Skin: Negative for rash.  Neurological: Positive for weakness and headaches. Negative for dizziness, focal weakness and loss of consciousness.  Psychiatric/Behavioral: Negative for depression. The patient is not nervous/anxious.     Patient Active Problem List   Diagnosis Date Noted  . Chronic systolic CHF (congestive heart failure) ([place]) 04/30/2016  . Stroke (cerebrum) ([place]) 04/25/2016  . CVA (cerebral vascular accident) ([place]) 04/25/2016  . Cerebrovascular accident (CVA) ([place])   . History of stroke 07/17/2015  . 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 Hypertension 07/17/2015  . Hyperlipidemia 07/17/2015  . Bilateral pleural effusion 06/27/2015  . Aspiration pneumonia ([place]) 06/27/2015  . Acute respiratory distress 06/27/2015  . Ischemic cardiomyopathy/EF 25% 06/27/2015  . Aspiration pneumonia of right middle lobe ([place])   . NSTEMI (non-ST elevated myocardial infarction) ([place]) 04/19/2015  . Coronary artery disease involving coronary bypass graft of native heart with unstable angina pectoris ([place])   . History of CVA (cerebrovascular accident) 04/05/2015  . Diabetic neuropathy ([place]) 12/22/2014  . Syncope 08/08/2014  . Status post placement of implantable loop recorder 08/08/2014  . Chest pain 08/08/2014  . 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 Human immunodeficiency virus (HIV) disease ([place]) 10/12/2008  . [place] SARCOMA 10/12/2008  . Hypothyroidism 10/12/2008  . Insulin dependent type 2 diabetes mellitus, uncontrolled ([place]) 10/12/2008  . HYPERCHOLESTEROLEMIA 10/12/2008  . S/P CABG x 5 10/12/2008  . GASTROESOPHAGEAL REFLUX DISEASE 10/12/2008  . RENAL DISEASE, CHRONIC, STAGE II 10/12/2008    Past Medical History: Past Medical History:  Diagnosis Date  . Anginal pain ([place])   . Anxiety   . Aspiration pneumonia ([place])  06/27/2015  . Chronic systolic CHF (congestive heart failure) ([place])    a. 12/2013 Echo: EF 45%;  b. 03/2015 Echo: EF 35%, mild MR, mildly dil LA.  Marland Kitchen Coronary artery disease    a. s/p prior CABG;  b. 03/2015 NSTEMI/PCI: 100ost CTO, LAD 100ost/p, 45d, D1 100/95ost, RI 75, LCX 100p, OM1/3 small, RCA 100ost->distal, RPDA/RPAV small,RPA1 80, RPA2 90, RPLB 80, RPLB2 80,  VG->D1->RI 10p, patent stents, VG->RPDA->RPLB3 99 before RPDA (3.5x24 Promus DES), 100 between PDA& RPLB3 (CTO), LIMA->LAD nl.  . Diabetic neuropathy ([place]) 12/22/2014  . GERD (gastroesophageal reflux disease)   . History of duodenal ulcer   . History of hiatal hernia   . HIV (human immunodeficiency virus infection) ([place]) a. Dx in 1980's.  Marland Kitchen Hyperlipidemia   . Hypertension   . Hypothyroidism   . 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 Kidney stones   . MI (myocardial infarction)    "I've had 4" (06/27/2015)  . NSTEMI (non-ST elevated myocardial infarction) ([place]) 03/2015  . Pneumonia "several times"  . Stroke [facility])    a. 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  Marland Kitchen Syncope and collapse    a. 12/2013 s/p MDT Linq ILR.  Marland Kitchen Type II diabetes mellitus ([place])     Past Surgical History: Past Surgical History:  Procedure Laterality Date  . CARDIAC CATHETERIZATION N/A 04/19/2015   Procedure: Left Heart Cath and Coronary Angiography;  Surgeon: Leonie Man, MD;  Location: [facility];  Service: Cardiovascular;  Laterality: N/A;  . CARDIAC CATHETERIZATION N/A 04/19/2015   Procedure: Coronary Stent Intervention;  Surgeon: Leonie Man, MD;  Location: [facility];  Service: Cardiovascular;  Laterality: N/A;  . CORONARY ANGIOPLASTY WITH STENT PLACEMENT  "3-4 times"   "I've got several stents"  . CORONARY ARTERY BYPASS GRAFT  1980s   "CABG X5; Dr. Lia Foyer"  . CYSTOSCOPY W/ STONE MANIPULATION  1980s  . LOOP RECORDER IMPLANT     Medtronic Reveal Model # [id number] MR safe    Social History: Social  History  Substance Use Topics  . Smoking status: Never Smoker  . Smokeless tobacco: Never Used  . Alcohol use 0.6 oz/week    1 Glasses of wine per week     Comment: "monthly"   Additional social history: Has male partner. Uses cane to ambulate. Please also refer to relevant sections of EMR.  Family History: Family History  Problem Relation Age of Onset  . Heart disease Mother   . Diabetes Mother   . Cancer Mother   . Heart attack Father   . Stroke Sister     ICH  . Diabetes Sister    Allergies and Medications: Allergies  Allergen Reactions  . Penicillins Anaphylaxis    Has patient had a PCN reaction causing immediate rash, facial/tongue/throat swelling, SOB or lightheadedness with hypotension: Yes Has patient had a PCN reaction causing severe rash involving mucus membranes or skin necrosis: No Has patient had a PCN reaction that required hospitalization No Has patient had a PCN reaction occurring within the last 10 years: No If all of the above answers are "NO", then may proceed with Cephalosporin use.   . Codeine Nausea Only   No current facility-administered medications on file prior to encounter.    Current Outpatient Prescriptions on File Prior to Encounter  Medication Sig Dispense Refill  . aspirin EC 81 MG tablet Take 81 mg by mouth daily.     Marland Kitchen atorvastatin (LIPITOR)  20 MG tablet Take 1 tablet (20 mg total) by mouth daily. 30 tablet 11  . citalopram (CELEXA) 20 MG tablet Take 1 tablet (20 mg total) by mouth daily. 30 tablet 5  . clopidogrel (PLAVIX) 75 MG tablet Take 1 tablet (75 mg total) by mouth daily. 30 tablet 11  . Darunavir Ethanolate (PREZISTA) 800 MG tablet Take 800 mg by mouth daily.     Marland Kitchen emtricitabine-tenofovir (TRUVADA) 200-300 MG per tablet Take 1 tablet by mouth daily.    . insulin aspart (NOVOLOG) 100 UNIT/ML injection Inject 6 Units into the skin 3 (three) times daily with meals. 6 units is the base. Add 2 units for each 15gm of carbs    . Insulin  Glargine (LANTUS SOLOSTAR) 100 UNIT/ML Solostar Pen Inject 20 Units into the skin daily at 10 pm. 5 pen 2  . isosorbide mononitrate (IMDUR) 30 MG 24 hr tablet Take 1 tablet (30 mg total) by mouth daily. 30 tablet 11  . levothyroxine (SYNTHROID, LEVOTHROID) 75 MCG tablet Take 4 tablets (300 mcg total) by mouth daily before breakfast. Must have office visit for refills 120 tablet 0  . metoprolol succinate (TOPROL XL) 25 MG 24 hr tablet Take 1 tablet (25 mg total) by mouth daily. 30 tablet 11  . pantoprazole (PROTONIX) 40 MG tablet Take 40 mg by mouth daily.     . ritonavir (NORVIR) 100 MG capsule Take 100 mg by mouth daily.     Marland Kitchen gabapentin (NEURONTIN) 300 MG capsule One capsule at night for 2 weeks, then take one capsule twice a day (Patient not taking: Reported on 2016-06-28) 60 capsule 3  . nitroGLYCERIN (NITROSTAT) 0.4 MG SL tablet Place 1 tablet (0.4 mg total) under the tongue every 5 (five) minutes as needed for chest pain. 25 tablet 3    Objective: BP (!) 86/56   Pulse 83   Temp 98.5 F (36.9 C) (Oral)   Resp 26   Ht 5' 8.5" (1.74 m)   Wt 161 lb (73 kg)   SpO2 94%   BMI 24.12 kg/m  Exam: General: emaciated male, well-developed, mild acute distress due to chest pain with non-toxic appearance, confused HEENT: normocephalic, atraumatic, moist mucous membranes, PERRLA, EMOI Neck: supple, non-tender without lymphadenopathy CV: regular rate and rhythm without murmurs, rubs, or gallops, no edema, pulses intact Lungs: clear to auscultation bilaterally with normal work of breathing on 5 L [place] Abdomen: soft, non-tender, no masses or organomegaly palpable, normoactive bowel sounds Skin: warm, dry, no rashes or lesions, cap refill < 2 seconds Extremities: warm and well perfused, normal tone, 5/5 motor strength in all 4 extremities Neuro: CNII-XII intact, dysarthria, speech is slow but coherent, no facial droop Psych: normal affect and mood  Labs and Imaging: Results for orders placed or  performed during the hospital encounter of 2016-06-28 (from the past 24 hour(s))  Basic metabolic panel     Status: Abnormal   Collection Time: 06/28/2016  1:17 PM  Result Value Ref Range   Sodium 133 (L) 135 - 145 mmol/L   Potassium 3.7 3.5 - 5.1 mmol/L   Chloride 98 (L) 101 - 111 mmol/L   CO2 22 22 - 32 mmol/L   Glucose, Bld 216 (H) 65 - 99 mg/dL   BUN 22 (H) 6 - 20 mg/dL   Creatinine, Ser 1.42 (H) 0.61 - 1.24 mg/dL   Calcium 9.7 8.9 - 10.3 mg/dL   GFR calc non Af Amer 53 (L) >60 mL/min   GFR calc Af Amer >  60 >60 mL/min   Anion gap 13 5 - 15  CBC     Status: None   Collection Time: 2016-07-12  1:17 PM  Result Value Ref Range   WBC 10.1 4.0 - 10.5 K/uL   RBC 4.70 4.22 - 5.81 MIL/uL   Hemoglobin 14.5 13.0 - 17.0 g/dL   HCT 42.0 39.0 - 52.0 %   MCV 89.4 78.0 - 100.0 fL   MCH 30.9 26.0 - 34.0 pg   MCHC 34.5 30.0 - 36.0 g/dL   RDW 12.1 11.5 - 15.5 %   Platelets 195 150 - 400 K/uL  I-stat troponin, ED     Status: None   Collection Time: July 12, 2016  1:24 PM  Result Value Ref Range   Troponin i, poc 0.02 0.00 - 0.08 ng/mL   Comment 3           DG Chest Portable 1 View (2016/07/12) FINDINGS: No pneumothorax. Mild cardiomegaly. Mild edema. No focal infiltrate, nodule, or mass.  IMPRESSION: Mild cardiomegaly and edema.  CT ANGIO CHEST PE W OR WO CONTRAST (July 12, 2016) FINDINGS: Cardiovascular: No filling defects within pulmonary arteries to suggest acute pulmonary embolism. No Acute findings aorta great vessels. Mediastinum/Nodes: No axillary or supraclavicular lymphadenopathy. No mediastinal adenopathy. Esophagus normal. Lungs/Pleura: There are bilateral lower lobe ground-glass opacities in a infrahilar pattern. More subtle ground-glass opacities in the upper lobes. Upper Abdomen: Limited view of the liver, kidneys, pancreas are unremarkable. Small gallstones noted. Normal adrenal glands. Musculoskeletal: No aggressive osseous lesion. Review of the MIP images confirms the above  findings.  IMPRESSION: 1. No evidence acute pulmonary embolism. 2. Bilateral lower lobe ground-glass opacities suggest pulmonary edema. Atypical infection is less favored.   [place] Bing, DO 07-12-16, 4:49 PM PGY-1, [place] Intern pager: [phone], text pages welcome  FPTS Upper-Level Resident Addendum  I have independently interviewed and examined the patient. I have discussed the above with the original author and agree with their documentation. My edits for correction/addition/clarification are in pink. Please see also any attending notes.   Katheren Shams, DO PGY-3, [place] Service pager: [phone] (text pages welcome through [facility])

## 2016-06-27 NOTE — ED Triage Notes (Signed)
Pt c/o sudden onset of left sided chest pain that radiates into neck, jaw and left arm. Pt reports sweating, N/V. Pt pale with clammy skin. Pt reports that his heart only works 25%.

## 2016-06-27 DEATH — deceased

## 2016-08-23 ENCOUNTER — Ambulatory Visit: Payer: Medicaid Other | Admitting: Internal Medicine

## 2016-12-10 IMAGING — CT CT HEAD W/O CM
1 series · 15 of 30 positions shown, 19 images · non-contrast
Comparison: None.

CLINICAL DATA: Code stroke: New onset dysphagia. Medical history
includes HIV.

EXAM:
CT HEAD WITHOUT CONTRAST
TECHNIQUE: Contiguous axial images were obtained from the base of the skull
through the vertex without intravenous contrast.

[Series 2: head 5.0 h31s · axial · 0.44mm/px · z∈[-156,-11]mm · 15 of 33 slices shown, 19 images]
[im 2/33  brain]
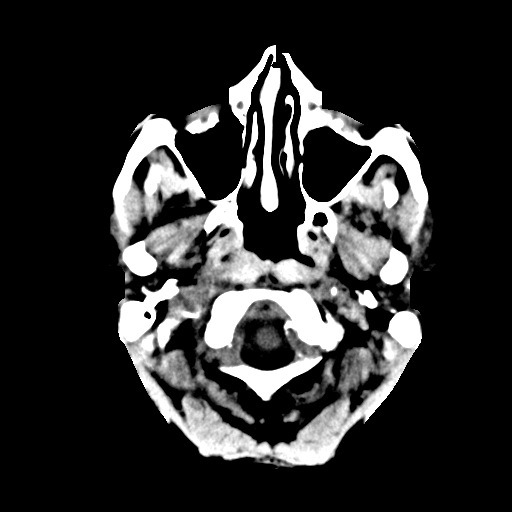
[im 2/33  bone]
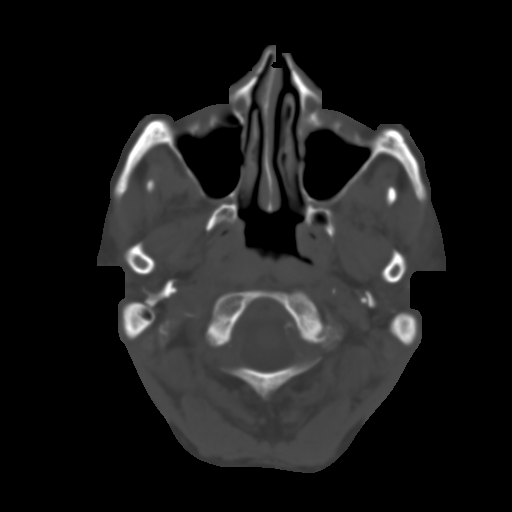
[im 4/33  brain]
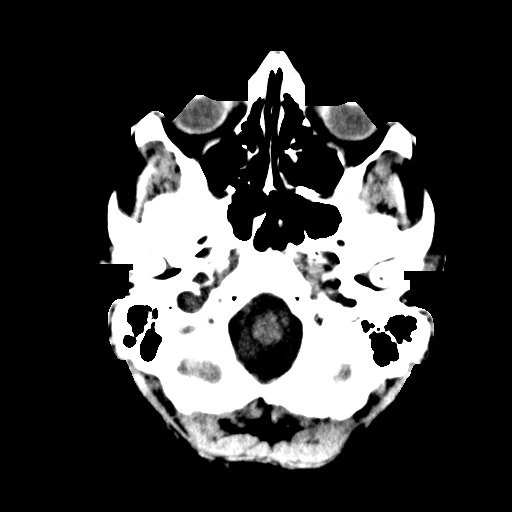
[im 6/33  brain]
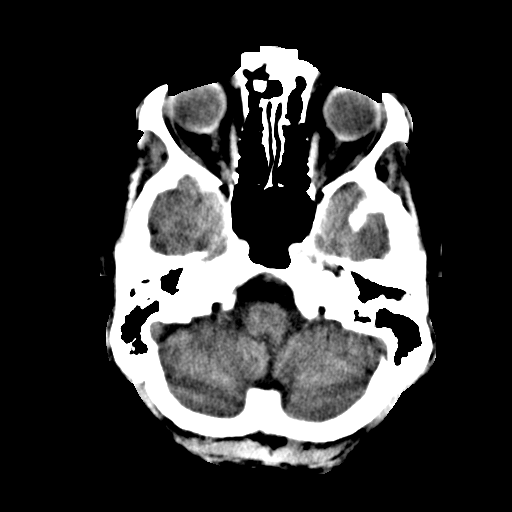
[im 8/33  brain]
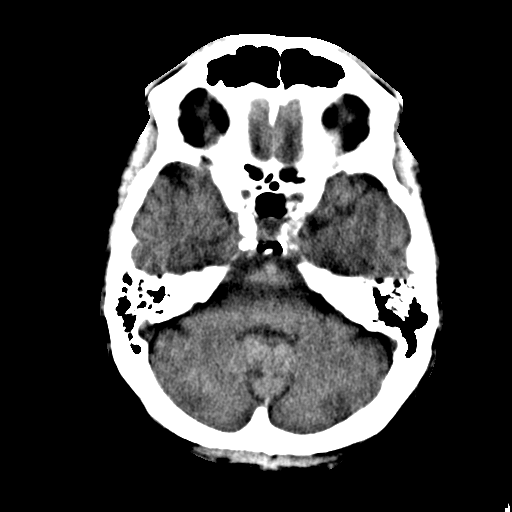
[im 10/33  brain]
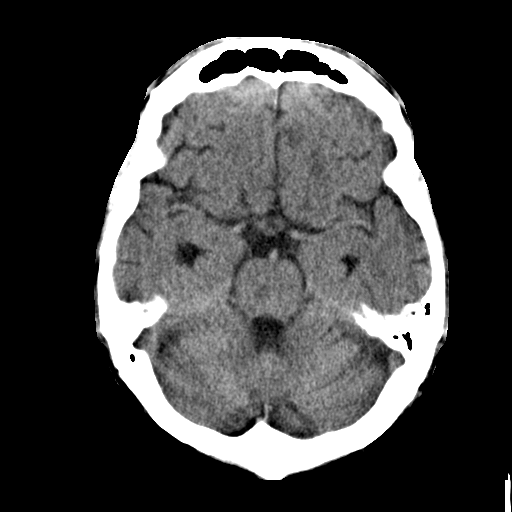
[im 10/33  bone]
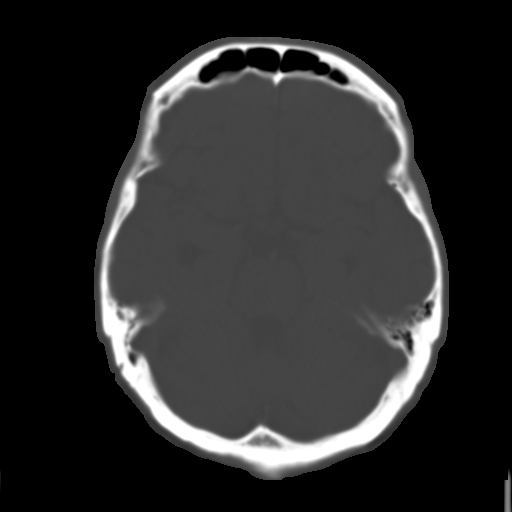
[im 13/33  brain]
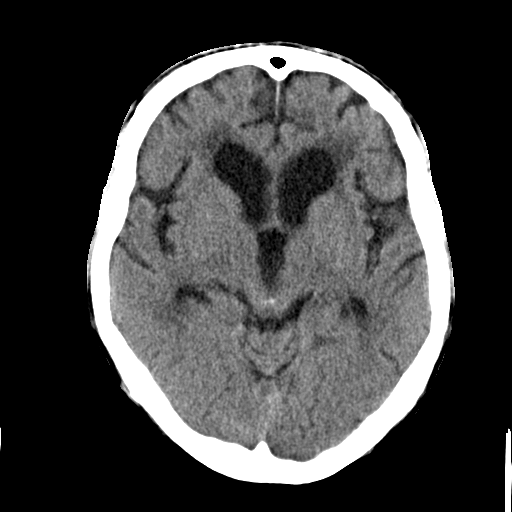
[im 15/33  brain]
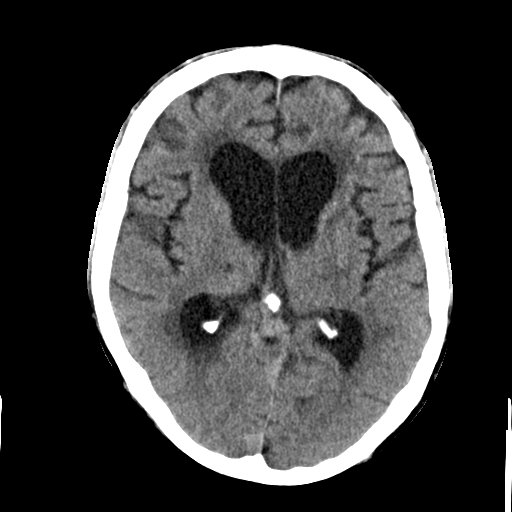
[im 17/33  brain]
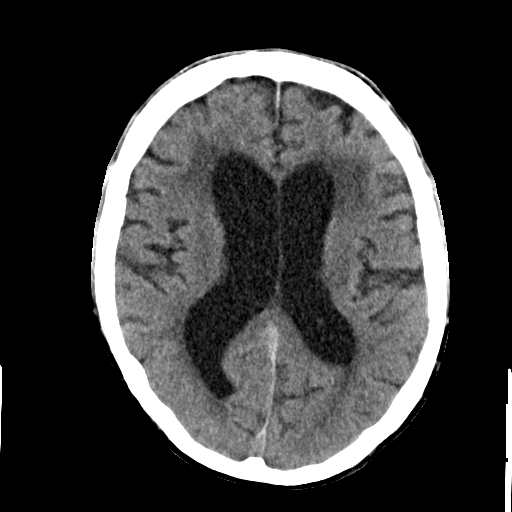
[im 18/33  brain]
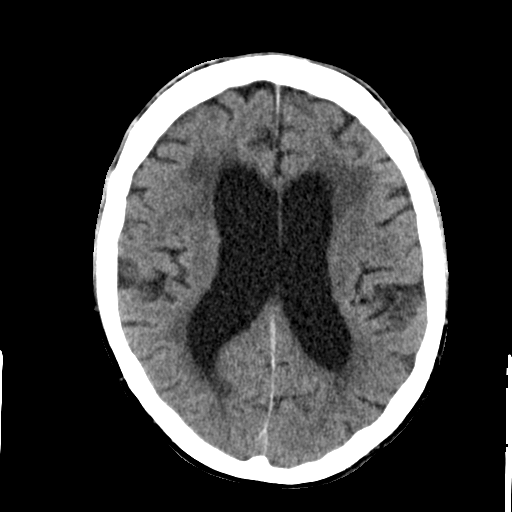
[im 18/33  bone]
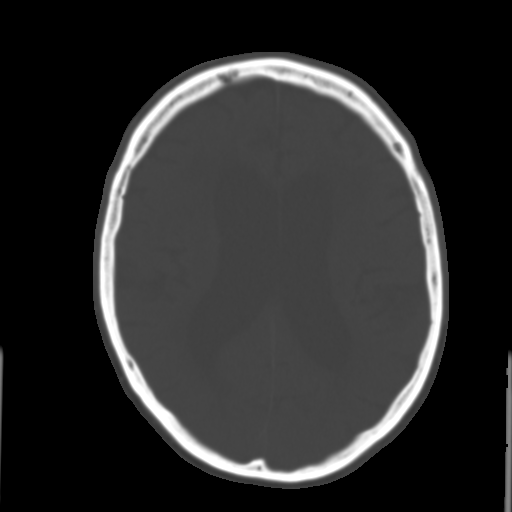
[im 20/33  brain]
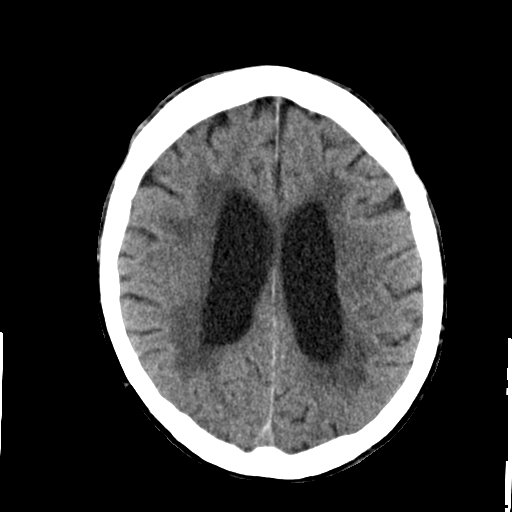
[im 23/33  brain]
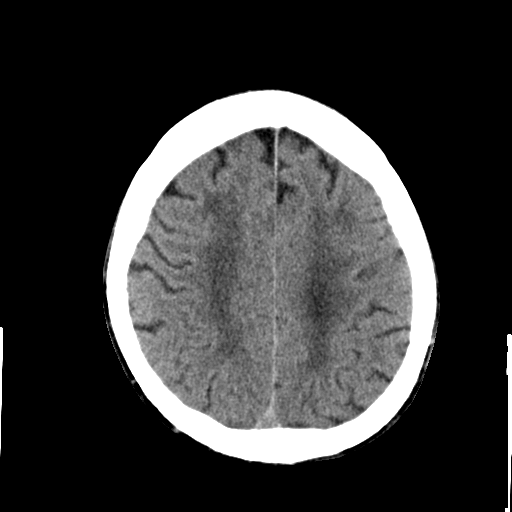
[im 25/33  brain]
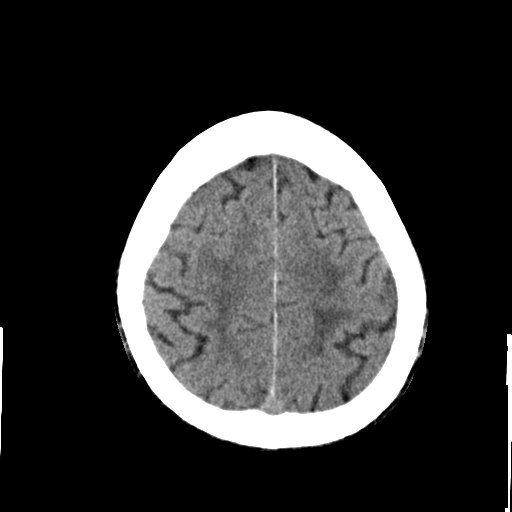
[im 27/33  brain]
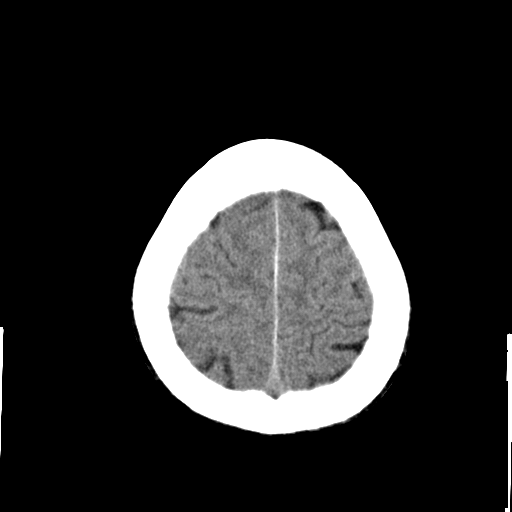
[im 27/33  bone]
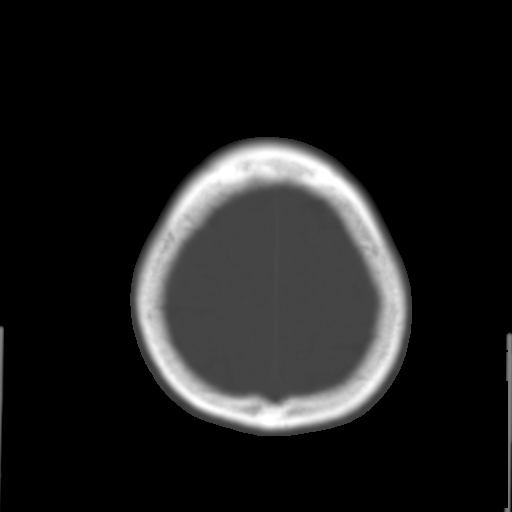
[im 29/33  brain]
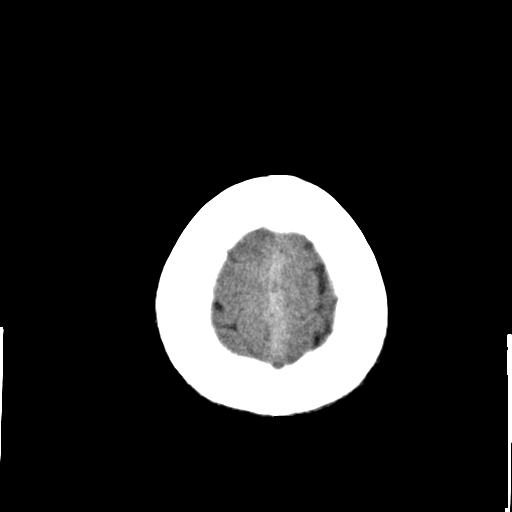
[im 31/33  brain]
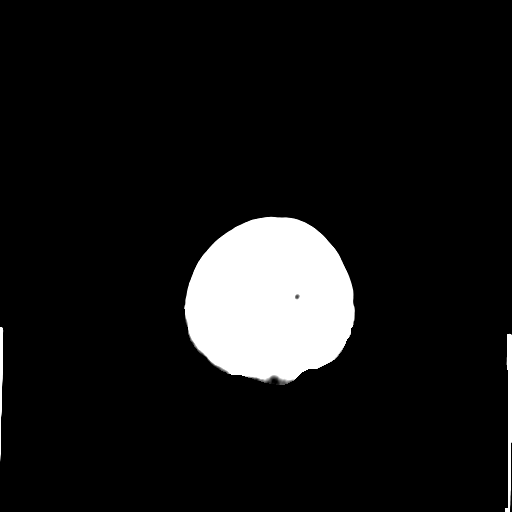

[15 of 30 positions shown; findings below may reference images not displayed]

FINDINGS: Negative for acute intracranial hemorrhage, acute infarction, mass,
mass effect, hydrocephalus or midline shift. Gray-white
differentiation is preserved throughout. Age advanced
periventricular, subcortical and deep white matter hypoattenuation.
While nonspecific, findings are most suggestive of chronic
microvascular ischemic white matter disease. No focal soft tissue or
calvarial abnormality. Globes and orbits are symmetric and intact
bilaterally. Normal aeration of the mastoid air cells and visualized
paranasal sinuses. Atherosclerotic calcification throughout both
cavernous carotid arteries.
IMPRESSION: 1. No acute intracranial abnormality.
2. Age advanced white matter hypoattenuation most consistent with
chronic microvascular ischemic white matter disease.
3. Intracranial atherosclerosis.
